# Patient Record
Sex: Female | Born: 1950 | Race: Asian | Hispanic: No | Marital: Married | Smoking: Current every day smoker
Health system: Southern US, Community
[De-identification: ages and names within clinical notes are randomized; demographics above are authoritative.]

## PROBLEM LIST (undated history)

## (undated) DIAGNOSIS — T7840XA Allergy, unspecified, initial encounter: Secondary | ICD-10-CM

## (undated) DIAGNOSIS — K219 Gastro-esophageal reflux disease without esophagitis: Secondary | ICD-10-CM

## (undated) DIAGNOSIS — E119 Type 2 diabetes mellitus without complications: Secondary | ICD-10-CM

## (undated) DIAGNOSIS — I10 Essential (primary) hypertension: Secondary | ICD-10-CM

## (undated) DIAGNOSIS — E785 Hyperlipidemia, unspecified: Secondary | ICD-10-CM

## (undated) DIAGNOSIS — M810 Age-related osteoporosis without current pathological fracture: Secondary | ICD-10-CM

## (undated) HISTORY — DX: Age-related osteoporosis without current pathological fracture: M81.0

## (undated) HISTORY — PX: COLONOSCOPY: SHX174

## (undated) HISTORY — DX: Gastro-esophageal reflux disease without esophagitis: K21.9

## (undated) HISTORY — DX: Allergy, unspecified, initial encounter: T78.40XA

## (undated) HISTORY — DX: Type 2 diabetes mellitus without complications: E11.9

## (undated) HISTORY — DX: Essential (primary) hypertension: I10

## (undated) HISTORY — DX: Hyperlipidemia, unspecified: E78.5

---

## 1976-07-31 HISTORY — PX: APPENDECTOMY: SHX54

## 1983-08-01 HISTORY — PX: TUBAL LIGATION: SHX77

## 2015-07-24 ENCOUNTER — Ambulatory Visit: Payer: Self-pay

## 2015-07-24 ENCOUNTER — Encounter (HOSPITAL_COMMUNITY): Payer: Self-pay | Admitting: Emergency Medicine

## 2015-07-24 ENCOUNTER — Emergency Department (INDEPENDENT_AMBULATORY_CARE_PROVIDER_SITE_OTHER)
Admission: EM | Admit: 2015-07-24 | Discharge: 2015-07-24 | Disposition: A | Discharge status: Home / Self Care | Payer: Medicaid - Out of State | Source: Home / Self Care

## 2015-07-24 DIAGNOSIS — K219 Gastro-esophageal reflux disease without esophagitis: Secondary | ICD-10-CM

## 2015-07-24 MED ORDER — OMEPRAZOLE 40 MG PO CPDR: 40.0000 mg | DELAYED_RELEASE_CAPSULE | Freq: Every day | ORAL | Status: DC

## 2015-07-24 NOTE — Discharge Instructions (Signed)
B?nh tro ng??c d? dy th?c qu?n, Ng??i l?n (Gastroesophageal Reflux Disease, Adult) Thng th??ng, th?c ?n di chuy?n xu?ng th?c qu?n v ? l?i d? dy ?? tiu ha. Tuy nhin, khi m?t ng??i b? b?nh tro ng??c d? dy th?c qu?n (GERD), th?c ?n v a xt trong d? dy tro ng??c tr? l?i th?c qu?n. Khi tnh tr?ng ny x?y ra, th?c qu?n b? lot v vim. D?n d?n, GERD c th? t?o ra nh?ng l? nh? (v?t lot) trn l?p nim m?c th?c qu?n.  NGUYN NHN Tnh tr?ng ny gy ra b?i m?t v?n ?? c?a ph?n c? gi?a th?c qu?n v d? dy (c? th?t th?c qu?n d??i, hay LES). Thng th??ng c? LES ?ng l?i sau khi th?c ?n ?i qua th?c qu?n vo d? dy. Khi LES b? y?u ho?c b?t th??ng, c? khng ?ng theo ?ng cch v ?i?u ? cho php th?c ?n v a xt d? dy tro ng??c tr? l?i th?c qu?n. LES c th? b? y?u do m?t s? ch?t ?n [place] nh?t ??nh, thu?c v cc tnh tr?ng b?nh l, bao g?m:  S? d?ng thu?c l.  Mang thai.  Thot v? honh.  S? d?ng nhi?u r??u.  M?t s? lo?i th?c ?n v ?? u?ng nh?t ??nh, nh? c ph, s c la, hnh v b?c h. CC Y?U T? NGUY C? Tnh tr?ng ny hay x?y ra h?n ?:  Nh?ng ng??i t?ng cn.  Nh?ng ng??i c cc b?nh ? m lin k?t.  Nh?ng ng??i s? d?ng thu?c NSAID. TRI?U CH?NG Nh?ng tri?u ch?ng c?a tnh tr?ng ny bao g?m:  ? nng.  Kh nu?t ho?c ?au khi nu?t.  C?m th?y nh? c m?t kh?i c?c trong c? h?ng.  C?m gic ??ng trong mi?ng.  H?i th? hi.  C nhi?u n??c b?t.  C?m gic kh ch?u trong b?ng ho?c ch??ng b?ng.  ? h?i.  ?au ng?c.  Kh th? ho?c th? kh kh.  Ho lin t?c (m?n tnh) ho?c ho vo ban ?m.  B? h?ng l?p men r?ng.  S?t cn. Nh?ng tnh tr?ng khc nhau c th? gy ?au ng?c. B?o ??m ph?i ??n khm chuyn gia ch?m [place] s?c kh?e n?u qu v? b? ?au ng?c. CH?N ?ON Chuyn gia ch?m [place] s?c kh?e c?a qu v? s? h?i v? b?nh s? v khm th?c th? cho qu v?. ?? xc ??nh qu v? b? GERD nh? hay n?ng, chuyn gia ch?m [place] s?c kh?e c?ng c th? theo di qu v? ?p ?ng v?i vi?c ?i?u tr? nh? th? no. Qu v? c?ng  c th? ph?i lm cc ki?m tra khc, bao g?m:  N?i soi ?? ki?m tra d? dy v th?c qu?n b?ng m?t camera nh?.  Ki?m tra ?o n?ng ?? a xt trong th?c qu?n c?a qu v?.  Ki?m tra ?o m?c p l?c ln th?c qu?n c?a qu v?.  Nu?t bari ho?c nu?t bari ?i?u ch?nh ?? hi?n th? hnh dng, kch th??c v ch?c n?ng c?a th?c qu?n c?a qu v?. ?I?U TR? M?c tiu c?a ?i?u tr? l gip gi?m cc tri?u ch?ng v trnh bi?n ch?ng. Vi?c ?i?u tr? b?nh ny c th? khc nhau ty thu?c m?c ?? n?ng c?a tri?u ch?ng. Chuyn gia ch?m [place] s?c kh?e c?a qu v? c th? khuy?n ngh?:  Thay ??i ch? ?? ?n.  Thu?c.  Ph?u thu?t. H??NG D?N CH?M [place] T?I NH Ch? ?? ?n  Tun th? m?t ch? ?? ?n theo khuy?n ngh? c?a chuyn gia ch?m [place] s?c kh?e. Vi?c ny c th? l  trnh cc th?c ?n v ?? u?ng nh?:  C ph v tr (c ho?c khng c caffeine).  ?? u?ng c ch?ar??u.  ?? u?ng t?ng l?c v ?? u?ng dng trong th? thao.  ?? u?ng c ga ho?c soda.  S c la v c ca.  B?c h v h??ng v? b?c h.  T?i v hnh.  C?i ng?a (Horseradish).  Cc th?c ?n nhi?u gia v? v a xt, bao g?m h?t tiu, b?t ?t, b?t ca ri, gi?m, n??c s?t cay, v n??c s?t barbecue.  N??c qu? ho?c qu? h? cam qut, ch?ng h?n nh? cam, chanh v chanh l cam.  Cc th?c ?n c c chua, nh? n??c x?t ??, ?t, n??c x?t salsa, v pizza km x?t ??  Th?c ?n chin v nhi?u ch?t bo, ch?ng h?n nh? bnh rn, khoai ty chin, khoai ty rn v n??c x?t nhi?u ch?t bo.  Th?t nhi?u ch?t bo, ch?ng h?n nh? hot dog (bnh m k?p xc xch) v cc lo?i th?t ?? v tr?ng nhi?u m?, ch?ng h?n nh? th?t n?c l?ng, xc xch, gi?m bng v th?t l?n xng khi.  Nh?ng s?n ph?m b? s?a giu ch?t bo, nh? s?a nguyn kem, b? v pho mt kem.  ?n cc b?a nh?, th??ng xuyn thay v cc b?a no.  Trnh u?ng nhi?u n??c khi qu v? ?n.  Trnh ?n trong kho?ng 2-3 gi? tr??c khi ?i ng?.  Trnh n?m xu?ng ngay sau khi ?n.  Khngt?p th? d?c ngay sau khi ?n. H??ng d?n chung  Ch  ??n nh?ng thay ??i v? tri?u ch?ng  c?a qu v?.  Ch? s? d?ng thu?c khng c?n k ??n v thu?c c?n k ??n theo ch? d?n c?a chuyn gia ch?m [place] s?c kh?e. Khng dng aspirin, ibuprofen, ho?c cc thu?c NSAID khc tr? khi chuyn gia ch?m [place] s?c kh?e c?a qu v? cho php.  Khng s? d?ng b?t k? s?n ph?m thu?c l no, bao g?m thu?c l d?ng ht, thu?c l d?ng nhai v thu?c l ?i?n t?. N?u qu v? c?n gip ?? ?? cai thu?c, hy h?i chuyn gia ch?m [place] s?c kh?e.  M?c qu?n o r?ng. Khng m?c ci g ch?t quanh eo m c th? t?o p l?c ln b?ng.  Nng (nng cao) ??u gi??ng c?a qu v? thm 6 inch (15 cm).  C? g?ng gi?m c?ng th?ng, ch?ng h?n nh? t?p yoga ho?c thi?n. N?u qu v? c?n gip ?? ?? gi?m c?ng th?ng, hy h?i chuyn gia ch?m [place] s?c kh?e.  N?u qu v? th?a cn, hy gi?m cn n?ng v? m?c c l?i cho s?c kh?e c?a qu v?. Hy h?i chuyn gia ch?m [place] s?c kh?e ?? ???c h??ng d?n v? m?c tiu gi?m cn an ton.  Tun th? t?t c? cc cu?c h?n khm l?i theo ch? d?n c?a chuyn gia ch?m [place] s?c kh?e. ?i?u ny c vai tr quan tr?ng. ?I KHM N?U:  Qu v? c cc tri?u ch?ng m?i.  Qu v? b? s?t cn khng r nguyn nhn.  Qu v? b? kh nu?t ho?c b? ?au khi nu?t.  Qu v? th? kh kh ho?c ho dai d?ng.  Cc tri?u ch?ng c?a qu v? khng c?i thi?n sau khi ???c ?i?u tr?Sander Nephew v? b? kh?n gi?ng. NGAY L?P T?C ?I KHM N?U:  Qu v? b? ?au ? cnh tay, c?, hm, r?ng ho?c l?ng.  Qu v? th?y ?? m? hi, chng m?t ho?c chong vng.  Qu v? b? ?au ng?c ho?c kh th?.  Qu v? nn v ch?t nn ra gi?ng nh? mu ho?c b c ph.  Qu v? b? ng?t.  Phn c?a qu v? c mu ho?c mu ?en.  Qu v? khng th? nu?t, u?ng hay ?n.   Thng tin ny khng nh?m m?c ?ch thay th? cho l?i khuyn m chuyn gia ch?m [place] s?c kh?e ni v?i qu v?. Hy b?o ??m qu v? ph?i th?o lu?n b?t k? v?n ?? g m qu v? c v?i chuyn gia ch?m  s?c kh?e c?a qu v?.   Document Released: 04/26/2005 Document Revised: 04/07/2015 Elsevier Interactive Patient Education [employer].

## 2015-07-24 NOTE — ED Notes (Signed)
C/o intermittent CP and abd pain onset 2 weeks Sx increase when she lays down and will experience SOB A&O x4... No acute distress.

## 2015-07-26 NOTE — ED Provider Notes (Signed)
CSN: [id number]     Arrival date & time 07/24/15  1335 History   None    Chief Complaint  Patient presents with  . Abdominal Pain  . Chest Pain   (Consider location/radiation/quality/duration/timing/severity/associated sxs/prior Treatment) HPI Presents with daily chest discomfrot for the last 2 weeks. States that discomfort feels similar to before she started omperazole. She states that she is eating different foods due to the holiday. Worse after eating, lasts for 10-15 minutes.   Was on 40 mg of omeprazole, but reduced to 20 mg because she did not feel she needed the 40. No vomiting, chest pain.  Recently moved to [place] from out of state. Awaiting transfer of insurance. History reviewed. No pertinent past medical history. History reviewed. No pertinent past surgical history. No family history on file. Social History  Substance Use Topics  . Smoking status: Never Smoker   . Smokeless tobacco: None  . Alcohol Use: No   OB History    No data available     Review of Systems ROS +'ve chest discomfort  Denies: HEADACHE, NAUSEA, ABDOMINAL PAIN, CHEST PAIN, CONGESTION, DYSURIA, SHORTNESS OF BREATH  Allergies  Review of patient's allergies indicates no known allergies.  Home Medications   Prior to Admission medications   Medication Sig Start Date End Date Taking? Authorizing Provider  diclofenac (VOLTAREN) 75 MG EC tablet Take 75 mg by mouth 2 (two) times daily.   Yes Historical Provider, MD  dicyclomine (BENTYL) 20 MG tablet Take 20 mg by mouth every 6 (six) hours.   Yes Historical Provider, MD  insulin glargine (LANTUS) 100 UNIT/ML injection Inject into the skin at bedtime.   Yes Historical Provider, MD  loratadine (CLARITIN) 10 MG tablet Take 10 mg by mouth daily.   Yes Historical Provider, MD  metFORMIN (GLUCOPHAGE) 1000 MG tablet Take 1,000 mg by mouth 2 (two) times daily with a meal.   Yes Historical Provider, MD  omeprazole (PRILOSEC) 20 MG capsule Take 20 mg by mouth daily.    Yes Historical Provider, MD  simvastatin (ZOCOR) 40 MG tablet Take 40 mg by mouth daily.   Yes Historical Provider, MD  traMADol (ULTRAM) 50 MG tablet Take by mouth every 6 (six) hours as needed.   Yes Historical Provider, MD  omeprazole (PRILOSEC) 40 MG capsule Take 1 capsule (40 mg total) by mouth daily. 07/24/15   Konrad Felix, PA   Meds Ordered and Administered this Visit  Medications - No data to display  BP 111/67 mmHg  Pulse 94  Temp(Src) 98 F (36.7 C) (Oral)  Resp 18  SpO2 98% No data found.   Physical Exam  Constitutional: She is oriented to person, place, and time. She appears well-developed and well-nourished.  HENT:  Head: Normocephalic and atraumatic.  Right Ear: External ear normal.  Left Ear: External ear normal.  Nose: Nose normal.  Mouth/Throat: Oropharynx is clear and moist.  Eyes: Conjunctivae are normal.  Neck: Normal range of motion. Neck supple.  Cardiovascular: Normal rate, regular rhythm and normal heart sounds.   Pulmonary/Chest: Effort normal and breath sounds normal.  Abdominal: Soft. Bowel sounds are normal. There is no tenderness. There is no rebound and no guarding.  Musculoskeletal: Normal range of motion.  Neurological: She is alert and oriented to person, place, and time.  Skin: Skin is warm and dry.  Psychiatric: She has a normal mood and affect. Her behavior is normal. Judgment and thought content normal.  Nursing note and vitals reviewed.   ED Course  Procedures (including critical care time)  Labs Review Labs Reviewed - No data to display  Imaging Review No results found.   Visual Acuity Review  Right Eye Distance:   Left Eye Distance:   Bilateral Distance:    Right Eye Near:   Left Eye Near:    Bilateral Near:         MDM   1. GERD without esophagitis    ECG: Normal sinus Rhythm without acute changes. Nl axis, intervals.   Increase Omperazole to 20 mg, bid.   Follow previous diet plan,  Help in finding  local pcp.    Konrad Felix, PA 07/26/15 1310

## 2016-01-04 ENCOUNTER — Ambulatory Visit (INDEPENDENT_AMBULATORY_CARE_PROVIDER_SITE_OTHER): Payer: Medicaid Other | Admitting: Internal Medicine

## 2016-01-04 ENCOUNTER — Encounter: Payer: Self-pay | Admitting: Internal Medicine

## 2016-01-04 VITALS — BP 120/48 | HR 67 | Temp 98.0°F | Ht 63.5 in | Wt 142.4 lb

## 2016-01-04 DIAGNOSIS — R059 Cough, unspecified: Secondary | ICD-10-CM | POA: Insufficient documentation

## 2016-01-04 DIAGNOSIS — M25512 Pain in left shoulder: Secondary | ICD-10-CM

## 2016-01-04 DIAGNOSIS — R05 Cough: Secondary | ICD-10-CM

## 2016-01-04 DIAGNOSIS — Z794 Long term (current) use of insulin: Secondary | ICD-10-CM | POA: Diagnosis not present

## 2016-01-04 DIAGNOSIS — E1165 Type 2 diabetes mellitus with hyperglycemia: Secondary | ICD-10-CM | POA: Insufficient documentation

## 2016-01-04 DIAGNOSIS — E785 Hyperlipidemia, unspecified: Secondary | ICD-10-CM | POA: Diagnosis not present

## 2016-01-04 DIAGNOSIS — E119 Type 2 diabetes mellitus without complications: Secondary | ICD-10-CM

## 2016-01-04 LAB — LIPID PANEL
Cholesterol: 146 mg/dL (ref 125–200)
HDL: 36 mg/dL — AB (ref >=46)
LDL CALC: 37 mg/dL (ref <130)
TRIGLYCERIDES: 363 mg/dL — AB (ref <150)
Total CHOL/HDL Ratio: 4.1 Ratio (ref <=5.0)
VLDL: 73 mg/dL — AB (ref <30)

## 2016-01-04 LAB — POCT GLYCOSYLATED HEMOGLOBIN (HGB A1C): Hemoglobin A1C: 9.2

## 2016-01-04 MED ORDER — BENZONATATE 100 MG PO CAPS: 100.0000 mg | ORAL_CAPSULE | Freq: Three times a day (TID) | ORAL | Status: DC | PRN

## 2016-01-04 MED ORDER — MELOXICAM 7.5 MG PO TABS: 7.5000 mg | ORAL_TABLET | Freq: Every day | ORAL | Status: DC

## 2016-01-04 NOTE — Patient Instructions (Addendum)
It was nice meeting you today Ms. Alyssa Weaver.   For your cough, you can take 2-3 teaspoons of honey, either alone or in a warm beverage, 2-3 times a day. I have also prescribed a medication called Tessalon perles to help with your cough. Please take this as prescribed on the bottle.   For your shoulder, I have prescribed a medication called meloxicam. Take this once a day. Also, please do the stretches I have listed two times a day.   We will see you back on June 20 to go over the results of your blood tests, and make sure your shoulder and throat are feeling better.   Be well,  Dr. Avon Gully  Shoulder Range of Motion Exercises Shoulder range of motion (ROM) exercises are designed to keep the shoulder moving freely. They are often recommended for people who have shoulder pain. MOVEMENT EXERCISE When you are able, do this exercise 5-6 days per week, or as told by your health care provider. Work toward doing 2 sets of 10 swings. Pendulum Exercise How To Do This Exercise Lying Down 1. Lie face-down on a bed with your abdomen close to the side of the bed. 2. Let your arm hang over the side of the bed. 3. Relax your shoulder, arm, and hand. 4. Slowly and gently swing your arm forward and back. Do not use your neck muscles to swing your arm. They should be relaxed. If you are struggling to swing your arm, have someone gently swing it for you. When you do this exercise for the first time, swing your arm at a 15 degree angle for 15 seconds, or swing your arm 10 times. As pain lessens over time, increase the angle of the swing to 30-45 degrees. 5. Repeat steps 1-4 with the other arm. How To Do This Exercise While Standing 1. Stand next to a sturdy chair or table and hold on to it with your hand.  Bend forward at the waist.  Bend your knees slightly.  Relax your other arm and let it hang limp.  Relax the shoulder blade of the arm that is hanging and let it drop.  While keeping your shoulder  relaxed, use body motion to swing your arm in small circles. The first time you do this exercise, swing your arm for about 30 seconds or 10 times. When you do it next time, swing your arm for a little longer.  Stand up tall and relax.  Repeat steps 1-7, this time changing the direction of the circles. 2. Repeat steps 1-8 with the other arm. STRETCHING EXERCISES Do these exercises 3-4 times per day on 5-6 days per week or as told by your health care provider. Work toward holding the stretch for 20 seconds. Stretching Exercise 1 1. Lift your arm straight out in front of you. 2. Bend your arm 90 degrees at the elbow (right angle) so your forearm goes across your body and looks like the letter "L." 3. Use your other arm to gently pull the elbow forward and across your body. 4. Repeat steps 1-3 with the other arm. Stretching Exercise 2 You will need a towel or rope for this exercise. 1. Bend one arm behind your back with the palm facing outward. 2. Hold a towel with your other hand. 3. Reach the arm that holds the towel above your head, and bend that arm at the elbow. Your wrist should be behind your neck. 4. Use your free hand to grab the free end of the  towel. 5. With the higher hand, gently pull the towel up behind you. 6. With the lower hand, pull the towel down behind you. 7. Repeat steps 1-6 with the other arm. STRENGTHENING EXERCISES Do each of these exercises at four different times of day (sessions) every day or as told by your health care provider. To begin with, repeat each exercise 5 times (repetitions). Work toward doing 3 sets of 12 repetitions or as told by your health care provider. Strengthening Exercise 1 You will need a light weight for this activity. As you grow stronger, you may use a heavier weight. 1. Standing with a weight in your hand, lift your arm straight out to the side until it is at the same height as your shoulder. 2. Bend your arm at 90 degrees so that your  fingers are pointing to the ceiling. 3. Slowly raise your hand until your arm is straight up in the air. 4. Repeat steps 1-3 with the other arm. Strengthening Exercise 2 You will need a light weight for this activity. As you grow stronger, you may use a heavier weight. 1. Standing with a weight in your hand, gradually move your straight arm in an arc, starting at your side, then out in front of you, then straight up over your head. 2. Gradually move your other arm in an arc, starting at your side, then out in front of you, then straight up over your head. 3. Repeat steps 1-2 with the other arm. Strengthening Exercise 3 You will need an elastic band for this activity. As you grow stronger, gradually increase the size of the bands or increase the number of bands that you use at one time. 1. While standing, hold an elastic band in one hand and raise that arm up in the air. 2. With your other hand, pull down the band until that hand is by your side. 3. Repeat steps 1-2 with the other arm.   This information is not intended to replace advice given to you by your health care provider. Make sure you discuss any questions you have with your health care provider.   Document Released: 04/15/2003 Document Revised: 12/01/2014 Document Reviewed: 07/13/2014 Elsevier Interactive Patient Education [employer].

## 2016-01-04 NOTE — Assessment & Plan Note (Signed)
Taking simvastatin as prescribed by prior PCP in [place].  - Cholesterol panel today - Review labs and make necessary med changes at f/u in two weeks

## 2016-01-04 NOTE — Assessment & Plan Note (Signed)
Likely 2/2 muscle strain, though patient does not recall trauma or inciting event. No inflammation or erythema visible on exam. ROM slightly limited due to pain. Neer's, empty can, arm drop negative. Full strength.  - Meloxicam qd - Provided stretching and strengthening exercises to be performed BID - F/u at appt in 2 weeks

## 2016-01-04 NOTE — Progress Notes (Signed)
Patient ID: Alyssa Weaver, female   DOB: [birth year], 65 y.o.   MRN: [id number]  65 y.o. year old female presents for well-woman visit. She recently moved from [place] and is here to establish care.   Acute Concerns:  Sore throat and cough - Started two days ago. Robitussin helped some. No fevers. Cough productive of white sputum without blood. Patient is current every day smoker and has smoked for 25 years.   L shoulder pain - Begin four weeks ago. Does not recall trauma or anything that happened prior to pain. Has been taking Advil and Tramadol that was prescribed to her in Novebmer 2016 by prior PCP in [place]. Denies swelling or redness. Pain is increased when laying on the affected shoulder. Has a shooting pain in the affected shoulder when picking up something heavy.   Diet: Well-balanced, doesn't eat greasy foods or sweets. Eats vegetables daily. Drinks water, orange juice, doesn't drink soda. Doesn't eat at fast food restaurants.   Exercise: Walks an hour a day.   Sexual/Birth History: G5P5  Birth Control: post-menopausal   Social:  Social History   Social History  . Marital Status: Married    Spouse Name: N/A  . Number of Children: N/A  . Years of Education: N/A   Social History Main Topics  . Smoking status: Current Every Day Smoker -- 0.25 packs/day for 25 years    Types: Cigarettes  . Smokeless tobacco: Never Used  . Alcohol Use: No  . Drug Use: No  . Sexual Activity: Yes    Birth Control/ Protection: Post-menopausal   Other Topics Concern  . None   Social History Narrative   Smokes cigarettes - one pack every 3-4 days. Has smoked for 25 years. At most 6-7 cigarettes a day. No EtOH use. No illicit drug use.    Immunization:  There is no immunization history on file for this patient.  Cancer Screening: Per patient. Results to be sent from PCP in [place].   Pap Smear: April 2016. No abnormalities per patient.   Mammogram: 2016. No abnormalities per patient.    Colonoscopy: 2008. No abnormalities per patient.   Dexa: Never performed.   Physical Exam: VITALS: Reviewed GEN: Pleasant female, NAD HEENT: Normocephalic, PERRL, EOMI, no scleral icterus, bilateral TM pearly grey, nasal septum midline, MMM, uvula midline, no anterior or posterior lymphadenopathy, no thyromegaly. No oropharyngeal erythema or exudates, no tonsillar enlargement.  CARDIAC:RRR, S1 and S2 present, no murmur, no heaves/thrills RESP: CTAB, normal effort ABD: soft, no tenderness, normal bowel sounds EXT: No edema SKIN: No rashes or bruises noted MSK: Mild TTP of L shoulder. No bony abnormalities, erythema, or swelling. Neer's, empty can, and arm drop negative. 5/5 strength upper extremities bilaterally. Slightly limited ROM 2/2 pain.   ASSESSMENT & PLAN: 65 y.o. female presents to establish care. Please see problem specific assessment and plan.   Type 2 diabetes mellitus ([place]) Currently taking Lantus and metformin 1000mg  as prescribed by prior PCP in [place].  - A1C today - Review labs and make necessary med changes at f/u appt in two weeks  Hyperlipidemia Taking simvastatin as prescribed by prior PCP in [place].  - Cholesterol panel today - Review labs and make necessary med changes at f/u in two weeks  Cough and sore throat Likely of viral etiology and/or related to tobacco abuse. No oropharyngeal erythema or exudates, no tonsillar enlargement. Lungs CTAB. Sore throat likely 2/2 coughing. Centor score -1 (1-2% strep pharyngitis) so strep testing not indicated today.  -  Honey 2-3 teaspoons BID PRN - Tessalon perles - F/u at appt in 2 weeks  Left shoulder pain Likely 2/2 muscle strain, though patient does not recall trauma or inciting event. No inflammation or erythema visible on exam. ROM slightly limited due to pain. Neer's, empty can, arm drop negative. Full strength.  - Meloxicam qd - Provided stretching and strengthening exercises to be performed BID - F/u at appt in 2  weeks    Adin Hector, MD PGY-1 [facility] Pager [phone]

## 2016-01-04 NOTE — Assessment & Plan Note (Signed)
Currently taking Lantus and metformin 1000mg  as prescribed by prior PCP in [place].  - A1C today - Review labs and make necessary med changes at f/u appt in two weeks

## 2016-01-04 NOTE — Assessment & Plan Note (Addendum)
Likely of viral etiology and/or related to tobacco abuse. No oropharyngeal erythema or exudates, no tonsillar enlargement. Lungs CTAB. Sore throat likely 2/2 coughing. Centor score -1 (1-2% strep pharyngitis) so strep testing not indicated today.  - Honey 2-3 teaspoons BID PRN - Tessalon perles - F/u at appt in 2 weeks

## 2016-01-18 ENCOUNTER — Ambulatory Visit: Payer: Medicaid - Out of State | Admitting: Internal Medicine

## 2016-01-18 ENCOUNTER — Ambulatory Visit (INDEPENDENT_AMBULATORY_CARE_PROVIDER_SITE_OTHER): Payer: Medicaid Other | Admitting: Internal Medicine

## 2016-01-18 ENCOUNTER — Encounter: Payer: Self-pay | Admitting: Internal Medicine

## 2016-01-18 VITALS — BP 126/55 | HR 72 | Temp 97.8°F | Ht 63.5 in | Wt 145.0 lb

## 2016-01-18 DIAGNOSIS — E785 Hyperlipidemia, unspecified: Secondary | ICD-10-CM | POA: Diagnosis not present

## 2016-01-18 DIAGNOSIS — Z794 Long term (current) use of insulin: Secondary | ICD-10-CM

## 2016-01-18 DIAGNOSIS — E119 Type 2 diabetes mellitus without complications: Secondary | ICD-10-CM | POA: Diagnosis not present

## 2016-01-18 MED ORDER — INSULIN GLARGINE 100 UNIT/ML ~~LOC~~ SOLN: 30.0000 [IU] | Freq: Every day | SUBCUTANEOUS | Status: DC

## 2016-01-18 MED ORDER — ATORVASTATIN CALCIUM 80 MG PO TABS: 80.0000 mg | ORAL_TABLET | Freq: Every day | ORAL | Status: DC

## 2016-01-18 NOTE — Assessment & Plan Note (Signed)
Poorly controlled with last A1C 9.2 (12/2015).  - Continue metformin 1000mg  BID - Increase Lantus to 30U qd (patient taking at lunchtime) - Begin measuring 2hr post-prandial blood sugar rather than fasting only - Encouraged patient to write down blood sugars daily and bring to next appt - F/u in one month. Adjust insulin as needed at that time.

## 2016-01-18 NOTE — Progress Notes (Signed)
   Subjective:    Patient ID: Alyssa Weaver, female    DOB: 09-02-1950, 65 y.o.   MRN: [id number]  HPI  Patient presents for diabetes and hyperlipidemia follow-up.   Diabetes Patient with A1C of 9.2 at last appt. Reports taking metformin 1000mg  BID and Lantus 25U at lunchtime. Says that she never misses a dose. Checks blood sugar first thing in the morning, and usually gets measurements between 50-100. Highest recent blood sugar she can remember was 130. She does not check her blood sugar at any other time of day.   Hyperlipidemia Patient currently taking simvastatin 40mg  qd. Denies missing any doses. Triglycerides and VLDL elevated at previous appt, with low HDL.   Of note, patient has recently moved from [place]. She reports that she does not have [place] Medicaid yet, and will be going back to her previous PCP in [place] within the next month to have her medications refilled.   Patient is current every day smoker.   Review of Systems See HPI.     Objective:   Physical Exam  Constitutional: She is oriented to person, place, and time. She appears well-developed and well-nourished. No distress.  HENT:  Head: Normocephalic and atraumatic.  Pulmonary/Chest: Effort normal. No respiratory distress.  Neurological: She is alert and oriented to person, place, and time.  Psychiatric: She has a normal mood and affect. Her behavior is normal.  Vitals reviewed.     Assessment & Plan:  Type 2 diabetes mellitus ([place]) Poorly controlled with last A1C 9.2 (12/2015).  - Continue metformin 1000mg  BID - Increase Lantus to 30U qd (patient taking at lunchtime) - Begin measuring 2hr post-prandial blood sugar rather than fasting only - Encouraged patient to write down blood sugars daily and bring to next appt - F/u in one month. Adjust insulin as needed at that time.   Hyperlipidemia Poorly controlled. High triglycerides and low HDL at prior appointment. Per ASCVD risk calculator, patient should be on high  intensity statin.  - Discontinue simvastatin and begin atorvastatin 80mg  qd   Adin Hector, MD PGY-1 [facility] Pager [phone]

## 2016-01-18 NOTE — Patient Instructions (Addendum)
It was nice seeing you again today.   To improve your cholesterol, we are changing your cholesterol medication. Please STOP taking simvastatin (Zocor), and START taking atorvastatin (Lipitor) 80mg  once a day.   We are increasing your dose of insulin (Lantus) so that your diabetes will be better controlled. Please increase your dose of Lantus to 30 units once a day. Also, please start measuring your blood sugar 2 hours after you eat lunch. It will be helpful to write down these blood sugar measurements and bring them with you to your next visit.   I will see you back in one month to see if your diabetes is under better control.   If you have any questions, please feel free to call the office.   Be well,  Dr. Avon Gully

## 2016-01-18 NOTE — Assessment & Plan Note (Signed)
Poorly controlled. High triglycerides and low HDL at prior appointment. Per ASCVD risk calculator, patient should be on high intensity statin.  - Discontinue simvastatin and begin atorvastatin 80mg  qd

## 2016-02-18 ENCOUNTER — Ambulatory Visit: Payer: Medicaid - Out of State | Admitting: Internal Medicine

## 2016-04-17 ENCOUNTER — Encounter: Payer: Self-pay | Admitting: Internal Medicine

## 2016-04-17 ENCOUNTER — Ambulatory Visit (INDEPENDENT_AMBULATORY_CARE_PROVIDER_SITE_OTHER): Payer: Self-pay | Admitting: Internal Medicine

## 2016-04-17 VITALS — BP 111/39 | HR 72 | Temp 98.2°F | Ht 63.5 in | Wt 142.8 lb

## 2016-04-17 DIAGNOSIS — E785 Hyperlipidemia, unspecified: Secondary | ICD-10-CM

## 2016-04-17 DIAGNOSIS — Z794 Long term (current) use of insulin: Secondary | ICD-10-CM

## 2016-04-17 DIAGNOSIS — E119 Type 2 diabetes mellitus without complications: Secondary | ICD-10-CM

## 2016-04-17 DIAGNOSIS — M25512 Pain in left shoulder: Secondary | ICD-10-CM

## 2016-04-17 LAB — POCT GLYCOSYLATED HEMOGLOBIN (HGB A1C): HEMOGLOBIN A1C: 8.8

## 2016-04-17 MED ORDER — INSULIN GLARGINE 100 UNIT/ML ~~LOC~~ SOLN: 33.0000 [IU] | Freq: Every day | SUBCUTANEOUS | 3 refills | Status: DC

## 2016-04-17 MED ORDER — ATORVASTATIN CALCIUM 80 MG PO TABS: 80.0000 mg | ORAL_TABLET | Freq: Every day | ORAL | 3 refills | Status: DC

## 2016-04-17 MED ORDER — METFORMIN HCL 1000 MG PO TABS: 1000.0000 mg | ORAL_TABLET | Freq: Two times a day (BID) | ORAL | 3 refills | Status: DC

## 2016-04-17 MED ORDER — MELOXICAM 15 MG PO TABS: 15.0000 mg | ORAL_TABLET | Freq: Every day | ORAL | 3 refills | Status: DC

## 2016-04-17 NOTE — Progress Notes (Signed)
   Subjective:    Patient ID: Alyssa Weaver, female    DOB: 1951-06-04, 65 y.o.   MRN: [id number]  HPI  Patient presents for DM follow up and shoulder pain.   Type II DM Currently taking metformin 1000mg  BID and Lantus 30U. Reports persistently high blood sugar at night, up to 350. Reports feeling tired. Denies numbness or tingling in extremities. Endorses having to read with glasses now, but nov ision change otherwise.   L shoulder pain Present for the past two months. Previously prescribed meloxicam 7.5mg . Reports pain is improved with this, but pain relief does not last all day. Has not taken anything else for pain.   HLD Taking atorvastatin 80 mg.   Review of Systems See HPI.     Objective:   Physical Exam  Constitutional: She is oriented to person, place, and time. She appears well-developed and well-nourished. No distress.  HENT:  Head: Normocephalic and atraumatic.  Cardiovascular: Normal rate, regular rhythm and normal heart sounds.   No murmur heard. Pulmonary/Chest: Effort normal and breath sounds normal. No respiratory distress. She has no wheezes.  Musculoskeletal:  No TTP of L shoulder. 5/5 strength upper extremities bilaterally. Negative empty can.   Neurological: She is alert and oriented to person, place, and time.  Psychiatric: She has a normal mood and affect. Her behavior is normal.  Vitals reviewed.     Assessment & Plan:  Type 2 diabetes mellitus (HCC) Uncontrolled. A1C remains elevated at 8.8 today, but has improved from 9.8 at last visit.  - Continue metformin 1000mg  BID - Increase Lantus to 33U - F/u in three months for repeat A1C  Left shoulder pain Increased Mobic to 15mg   Hyperlipidemia - Continue atorvastatin 80mg  - Repeat lipid panel today. Adjust medication at next visit as needed.   Adin Hector, MD, MPH PGY-2 [facility] Pager [phone]

## 2016-04-17 NOTE — Patient Instructions (Addendum)
It was nice seeing you again today Ms. Kopelman.   Please increase your insulin dose to 33 units per day. I will see you back in three months to make sure your A1C is improving.   For your shoulder pain, I have increased your dose of Mobic to 15 mg. You can continue to take this daily.   If you have any questions or concerns, please feel free to call the clinic.   Be well,  Dr. Avon Gully

## 2016-04-17 NOTE — Assessment & Plan Note (Signed)
-   Continue atorvastatin 80mg  - Repeat lipid panel today. Adjust medication at next visit as needed.

## 2016-04-17 NOTE — Assessment & Plan Note (Signed)
Increased Mobic to 15mg 

## 2016-04-17 NOTE — Assessment & Plan Note (Addendum)
Uncontrolled. A1C remains elevated at 8.8 today, but has improved from 9.8 at last visit.  - Continue metformin 1000mg  BID - Increase Lantus to 33U - F/u in three months for repeat A1C

## 2016-04-18 LAB — LIPID PANEL
Cholesterol: 111 mg/dL — ABNORMAL LOW (ref 125–200)
HDL: 39 mg/dL — AB (ref >=46)
LDL CALC: 54 mg/dL (ref <130)
Total CHOL/HDL Ratio: 2.8 Ratio (ref <=5.0)
Triglycerides: 88 mg/dL (ref <150)
VLDL: 18 mg/dL (ref <30)

## 2016-06-06 ENCOUNTER — Ambulatory Visit (INDEPENDENT_AMBULATORY_CARE_PROVIDER_SITE_OTHER): Payer: Self-pay | Admitting: Internal Medicine

## 2016-06-06 ENCOUNTER — Encounter: Payer: Self-pay | Admitting: Internal Medicine

## 2016-06-06 VITALS — BP 114/80 | HR 76 | Resp 18 | Ht 62.5 in | Wt 142.0 lb

## 2016-06-06 DIAGNOSIS — Z794 Long term (current) use of insulin: Secondary | ICD-10-CM

## 2016-06-06 DIAGNOSIS — G8929 Other chronic pain: Secondary | ICD-10-CM

## 2016-06-06 DIAGNOSIS — E782 Mixed hyperlipidemia: Secondary | ICD-10-CM

## 2016-06-06 DIAGNOSIS — E119 Type 2 diabetes mellitus without complications: Secondary | ICD-10-CM

## 2016-06-06 DIAGNOSIS — M25512 Pain in left shoulder: Secondary | ICD-10-CM

## 2016-06-06 MED ORDER — INSULIN GLARGINE 100 UNIT/ML ~~LOC~~ SOLN: 33.0000 [IU] | Freq: Every day | SUBCUTANEOUS | 11 refills | Status: DC

## 2016-06-06 MED ORDER — GLUCOSE BLOOD VI STRP: ORAL_STRIP | 12 refills | Status: DC

## 2016-06-06 MED ORDER — OMEPRAZOLE 40 MG PO CPDR: 40.0000 mg | DELAYED_RELEASE_CAPSULE | Freq: Every day | ORAL | 11 refills | Status: DC

## 2016-06-06 MED ORDER — AGAMATRIX PRESTO W/DEVICE KIT: PACK | 0 refills | Status: DC

## 2016-06-06 MED ORDER — METFORMIN HCL 1000 MG PO TABS: 1000.0000 mg | ORAL_TABLET | Freq: Two times a day (BID) | ORAL | 11 refills | Status: DC

## 2016-06-06 MED ORDER — ATORVASTATIN CALCIUM 80 MG PO TABS: 80.0000 mg | ORAL_TABLET | Freq: Every day | ORAL | 11 refills | Status: DC

## 2016-06-06 NOTE — Patient Instructions (Addendum)
Drink a glass of water before every meal Drink 6-8 glasses of water daily Eat three meals daily Eat a protein and healthy fat with every meal (eggs,fish, chicken, [place] and limit red meats) Eat 5 servings of vegetables daily, mix the colors Eat 2 servings of fruit daily with skin, if skin is edible Use smaller plates Put food/utensils down as you chew and swallow each bite Eat at a table with friends/family at least once daily, no TV Do not eat in front of the TV  Check sugars twice daily , before breakfast and before dinner. Once weekly, check before lunch and once before bedtime instead of dinner

## 2016-06-06 NOTE — Progress Notes (Signed)
   Subjective:    Patient ID: Alyssa Weaver, female    DOB: 1950/12/16, 65 y.o.   MRN: [id number]  HPI   Here to establish Moved from the [place] after living there for 30 years in November 2016. Originally from [place] Here with husband  1.  DM Type 2:  Diagnosed for 20 years.  A1C in September was 8.8S%. This was before a change in her Lantus from 30 units to 33 units.  She is only checking sugars with breakfast.  Sugars then are almost uniformaly below 110.  Often down into 50s or 60s.  She holds her Metformin in the morning if her sugar is below 100.  States generally twice weekly she is holding Metformin. Takes Lantus at lunch Eats white bread and coffee for morning meal.   Eats white rice.   Drinks juice no soda.  No heart disease Last eye check was August 2016 No flu vaccine this year Husband thinks she has had her pneumonia vaccine.   2.  Hyperlipidemia:  Taking Atorvastatin 80 mg daily  Current Meds  Medication Sig  . atorvastatin (LIPITOR) 80 MG tablet Take 1 tablet (80 mg total) by mouth daily.  . insulin glargine (LANTUS) 100 UNIT/ML injection Inject 0.33 mLs (33 Units total) into the skin at bedtime.  Marland Kitchen loratadine (CLARITIN) 10 MG tablet Take 10 mg by mouth daily.  . meloxicam (MOBIC) 15 MG tablet Take 1 tablet (15 mg total) by mouth daily.  . metFORMIN (GLUCOPHAGE) 1000 MG tablet Take 1 tablet (1,000 mg total) by mouth 2 (two) times daily with a meal.  . omeprazole (PRILOSEC) 40 MG capsule Take 1 capsule (40 mg total) by mouth daily.  . traMADol (ULTRAM) 50 MG tablet Take 50 mg by mouth every 6 (six) hours as needed.   No Known Allergies    Review of Systems     Objective:   Physical Exam  NAD Lungs: CTA CV:  RRR with normal S1 and S2, No S3, S4 or murmur, radial and DP pulses normal and equal. LE:  No edema      Assessment & Plan:  1.  DM:  Her diet need improvement.  Long 4 way discussion with daughter, Alyssa Weaver helping with interpretation regarding  diet and not missing meals.  Also to check sugars twice daily so we can get a handle as to what her sugars are doing later in day as often low in the morning and A1C still too high. Encouraged flu vaccine and will get old records regarding Pneumovax  2.  HYperlipidemia:  Continue Current dose of Atorvastatin for now, but will likely decrease dose in the future.  3.  Left shoulder pain, chronic:  Will address at next visit.

## 2016-06-08 ENCOUNTER — Telehealth: Payer: Self-pay | Admitting: Internal Medicine

## 2016-06-08 NOTE — Telephone Encounter (Signed)
Called and left message ok for solostar and also that ok for 30 units, but thought she was taking 33 and had not changed the dosing yet.

## 2016-06-08 NOTE — Telephone Encounter (Signed)
[facility] pharmacy called and would like to speak to Dr. Amil Amen today about the patient's Lantus 33 units  Patient was there yesterday and the pharmacy used the interpreter line with the patient and learned patient had used Lantus Solostar and reports using 30 units. Please advise

## 2016-06-19 ENCOUNTER — Ambulatory Visit: Payer: Self-pay | Admitting: Internal Medicine

## 2016-06-19 ENCOUNTER — Telehealth: Payer: Self-pay | Admitting: Internal Medicine

## 2016-06-19 MED ORDER — DEXLANSOPRAZOLE 30 MG PO CPDR: 30.0000 mg | DELAYED_RELEASE_CAPSULE | Freq: Every day | ORAL | 11 refills | Status: DC

## 2016-06-19 MED ORDER — MOMETASONE FUROATE 50 MCG/ACT NA SUSP: NASAL | 12 refills | Status: DC

## 2016-06-19 MED ORDER — INSULIN GLARGINE 100 UNIT/ML SOLOSTAR PEN: PEN_INJECTOR | SUBCUTANEOUS | 99 refills | Status: DC

## 2016-08-01 ENCOUNTER — Ambulatory Visit (INDEPENDENT_AMBULATORY_CARE_PROVIDER_SITE_OTHER): Payer: Self-pay | Admitting: Internal Medicine

## 2016-08-01 ENCOUNTER — Encounter: Payer: Self-pay | Admitting: Internal Medicine

## 2016-08-01 VITALS — BP 110/78 | HR 74 | Resp 12 | Ht 62.0 in | Wt 142.0 lb

## 2016-08-01 DIAGNOSIS — Z794 Long term (current) use of insulin: Secondary | ICD-10-CM

## 2016-08-01 DIAGNOSIS — Z9109 Other allergy status, other than to drugs and biological substances: Secondary | ICD-10-CM

## 2016-08-01 DIAGNOSIS — M25512 Pain in left shoulder: Secondary | ICD-10-CM

## 2016-08-01 DIAGNOSIS — J3089 Other allergic rhinitis: Secondary | ICD-10-CM | POA: Insufficient documentation

## 2016-08-01 DIAGNOSIS — K029 Dental caries, unspecified: Secondary | ICD-10-CM

## 2016-08-01 DIAGNOSIS — Z79899 Other long term (current) drug therapy: Secondary | ICD-10-CM

## 2016-08-01 DIAGNOSIS — E119 Type 2 diabetes mellitus without complications: Secondary | ICD-10-CM

## 2016-08-01 DIAGNOSIS — E782 Mixed hyperlipidemia: Secondary | ICD-10-CM

## 2016-08-01 DIAGNOSIS — G8929 Other chronic pain: Secondary | ICD-10-CM

## 2016-08-01 LAB — GLUCOSE, POCT (MANUAL RESULT ENTRY): POC Glucose: 183 mg/dl — AB (ref 70–99)

## 2016-08-01 NOTE — Patient Instructions (Addendum)
For Nicotine gum:  When you feel need for smoking:  Place nicotine gum in mouth and chew until juicy, then stick between gum and cheek.  You will absorb the nicotine from your mouth.   Do not aggressively chew gum. Spit gum out in garbage once you feel you are done with it  For Sone (Husband)  Recommend nicotine patches:  21 mg to skin and change daily for 28 days, then down to 14 mg patches for 14 days, then down to 7 mg patches for 14 days, then stop. Get rid of all cigarettes, lighters, ash trays, smoking paraphernalia before start first patch  Sign release for records from eye doctor visit in September so we can see if you have a prescription

## 2016-08-01 NOTE — Progress Notes (Signed)
Subjective:    Patient ID: Alyssa Weaver, female    DOB: July 19, 1951, 66 y.o.   MRN: [id number]  HPI   1.  DM:  Did not bring glucose journal.  Checking sugars twice daily before breakfast and before dinner.  Had one low blood sugar that awakened her from sleep on 12/29:  In the 28s.  Ate sticky rice and juice as well as candy.  This happens maybe once to twice monthly.  Sounds like when she is more active or eats less in the evening before.   Sugars running 80s to 130s in the morning. Sugars before dinner running around 130s.   Very difficult interpretation with husband and patient today.   Is fasting today. They did sign release for records from Dr. Reinaldo Berber in [facility] last visit.  These have not come to [place] yet. Husband believes she had a Pneumovax. Lantus 30 units before lunch. Needs needles for Lantus injections.  2.  Hyperlipidemia:  Previous lipid profile with low HDL, but LDL quite low as well as was total  3.  Tobacco Abuse:  Smokes 5 cigarettes daily, usually after eating.  Would like to quit, but does not know how.  Discussed nicotine gum.  4.  Left shoulder pain:  For 6 months.  No history of injury to the joint.  Not clear how started.  Sounds like a gradually increasing pain.  No activity that seemed to bring it on originally.  She has difficulty moving the shoulder in almost any direction and also with sleeping on the shoulder.  Was previously taking Meloxicam and Tramadol in the past.  Helped, but ran out.   Has not received any physical therapy for this.  Not clear what patient taking.  Husband and wife state she is taking medication they received, but then showing me old prescriptions for test strips, Fluticasone and state they have not picked up the Rx from [facility].  Current Meds  Medication Sig  . atorvastatin (LIPITOR) 80 MG tablet Take 1 tablet (80 mg total) by mouth daily.  . Blood Glucose Monitoring Suppl (AGAMATRIX PRESTO) w/Device KIT Check sugars  twice daily before meals  . glucose blood (AGAMATRIX PRESTO TEST) test strip Check sugars twice daily  . Insulin Glargine (LANTUS SOLOSTAR) 100 UNIT/ML Solostar Pen 30 units injected subcutaneously once daily  . loratadine (CLARITIN) 10 MG tablet Take 10 mg by mouth daily.  . metFORMIN (GLUCOPHAGE) 1000 MG tablet Take 1 tablet (1,000 mg total) by mouth 2 (two) times daily with a meal.  . mometasone (NASONEX) 50 MCG/ACT nasal spray 2 sprays each nostril daily    No Known Allergies   Past Medical History:  Diagnosis Date  . Diabetes mellitus without complication ([place])   . Hyperlipidemia     History reviewed. No pertinent surgical history.   Social History   Social History  . Marital status: Married    Spouse name: [name]  . Number of children: 5  . Years of education: 50   Occupational History  . housewife    Social History Main Topics  . Smoking status: Current Every Day Smoker    Packs/day: 0.25    Years: 25.00    Types: Cigarettes  . Smokeless tobacco: Never Used     Comment: Husband smokes 1 ppd.  He might be willing to quit at same time.  . Alcohol use No  . Drug use: No  . Sexual activity: Yes    Birth control/ protection: Post-menopausal  Other Topics Concern  . Not on file   Social History Narrative   Originally from [place]   Refugees--lived in [place] 2 years.   Came to the [place].   Lived in the [place] until moving to [facility] Nov. 2016   Family History  Problem Relation Age of Onset  . Asthma Brother        Review of Systems     Objective:   Physical Exam NAD HEENT:  PERRL, EOMI:  Discs sharp, TMs pearly gray, throat without injection.  Significant gingival recession, tooth loss and dental caries with periodontal plaque. Neck:  Supple no adenopathy Chest:  CTA CV:  RRR with normal S1 and S2, No S3, S4 or murmur, No carotid bruits, carotid, radial and DP pulses  Normal and equal No LE edema  Diabetic Foot Exam - Simple   Simple Foot  Form Diabetic Foot exam was performed with the following findings:  Yes 08/01/2016 10:19 AM  Visual Inspection No deformities, no ulcerations, no other skin breakdown bilaterally:  Yes Sensation Testing Intact to touch and monofilament testing bilaterally:  Yes Pulse Check Posterior Tibialis and Dorsalis pulse intact bilaterally:  Yes Comments Some dryness of edges of heels bilaterally          Assessment & Plan:  1.  DM:  Not clear if getting a good history.  She did not bring in diabetic journal.  She did not bring in meds or supplies.  Not clear if any changes made with diet/physical activity A1C and microalbumin/crea ratio Call into [facility] to see what they have applied for and picked up there.  2.  Hyperlipidemia:  Recheck today along with CMP.  Consider drop of statin to lower dose if total and LDL still quite low.  Will add fish oil if HDL remains low.  3.  Chronic left shoulder pain:  Appears frozen.  Xray ordered through orange card and PT in [facility].  Went over this with couple.  They should receive phone calls.  4.  Dental decay:  Dental referral  5.  Decreased visual acuity:  Reportedly had eye check in the [place] in September.  They are not aware of a prescription for lenses being written, but state she needs new glasses.  Will send for those records.  Can get a voucher for eyeglasses thereafter through orange card.  6. Environmental allergies:  Discussed Nasonex, does not appear they applied and picked up.  Using Fluticasone as needed --discussed to use regularly during the winter months when she has symptoms.  7.  Tobacco Abuse:  Asked patient and her husband to quit at same time.  Went over nicotine gum for her and nicotine patches for him  Follow up in 3 months

## 2016-08-02 LAB — CBC WITH DIFFERENTIAL/PLATELET
BASOS: 0 %
Basophils Absolute: 0 10*3/uL (ref 0.0–0.2)
EOS (ABSOLUTE): 0.4 10*3/uL (ref 0.0–0.4)
EOS: 7 %
HEMATOCRIT: 40.6 % (ref 34.0–46.6)
HEMOGLOBIN: 13 g/dL (ref 11.1–15.9)
IMMATURE GRANS (ABS): 0 10*3/uL (ref 0.0–0.1)
Immature Granulocytes: 0 %
LYMPHS: 25 %
Lymphocytes Absolute: 1.3 10*3/uL (ref 0.7–3.1)
MCH: 26.1 pg — ABNORMAL LOW (ref 26.6–33.0)
MCHC: 32 g/dL (ref 31.5–35.7)
MCV: 81 fL (ref 79–97)
MONOCYTES: 9 %
Monocytes Absolute: 0.5 10*3/uL (ref 0.1–0.9)
NEUTROS ABS: 3.1 10*3/uL (ref 1.4–7.0)
NEUTROS PCT: 59 %
Platelets: 234 10*3/uL (ref 150–379)
RBC: 4.99 x10E6/uL (ref 3.77–5.28)
RDW: 14.3 % (ref 12.3–15.4)
WBC: 5.3 10*3/uL (ref 3.4–10.8)

## 2016-08-02 LAB — COMPREHENSIVE METABOLIC PANEL
A/G RATIO: 1.9 (ref 1.2–2.2)
ALT: 44 IU/L — AB (ref 0–32)
AST: 32 IU/L (ref 0–40)
Albumin: 4.7 g/dL (ref 3.6–4.8)
Alkaline Phosphatase: 78 IU/L (ref 39–117)
BUN/Creatinine Ratio: 22 (ref 12–28)
BUN: 14 mg/dL (ref 8–27)
Bilirubin Total: 0.3 mg/dL (ref 0.0–1.2)
CALCIUM: 9.5 mg/dL (ref 8.7–10.3)
CO2: 28 mmol/L (ref 18–29)
CREATININE: 0.64 mg/dL (ref 0.57–1.00)
Chloride: 102 mmol/L (ref 96–106)
GFR, EST AFRICAN AMERICAN: 109 mL/min/{1.73_m2} (ref >59)
GFR, EST NON AFRICAN AMERICAN: 95 mL/min/{1.73_m2} (ref >59)
Globulin, Total: 2.5 g/dL (ref 1.5–4.5)
Glucose: 142 mg/dL — ABNORMAL HIGH (ref 65–99)
POTASSIUM: 4.5 mmol/L (ref 3.5–5.2)
Sodium: 144 mmol/L (ref 134–144)
TOTAL PROTEIN: 7.2 g/dL (ref 6.0–8.5)

## 2016-08-02 LAB — HGB A1C W/O EAG: HEMOGLOBIN A1C: 9.6 % — AB (ref 4.8–5.6)

## 2016-08-02 LAB — MICROALBUMIN / CREATININE URINE RATIO
Creatinine, Urine: 193 mg/dL
Microalb/Creat Ratio: 7.9 mg/g creat (ref 0.0–30.0)
Microalbumin, Urine: 15.3 ug/mL

## 2016-08-02 LAB — LIPID PANEL W/O CHOL/HDL RATIO
CHOLESTEROL TOTAL: 131 mg/dL (ref 100–199)
HDL: 39 mg/dL — AB (ref >39)
LDL Calculated: 60 mg/dL (ref 0–99)
TRIGLYCERIDES: 161 mg/dL — AB (ref 0–149)
VLDL CHOLESTEROL CAL: 32 mg/dL (ref 5–40)

## 2016-08-11 NOTE — Progress Notes (Signed)
Left message for patient to call office.  

## 2016-08-23 NOTE — Progress Notes (Signed)
Spoke with husband. Informed of lab results. States he will bring in machine and readings at next appointment.

## 2016-08-24 ENCOUNTER — Encounter: Payer: Self-pay | Admitting: Internal Medicine

## 2016-08-24 ENCOUNTER — Ambulatory Visit (INDEPENDENT_AMBULATORY_CARE_PROVIDER_SITE_OTHER): Payer: Self-pay | Admitting: Internal Medicine

## 2016-08-24 VITALS — BP 112/82 | HR 82 | Temp 98.2°F | Resp 12 | Ht 62.0 in | Wt 142.0 lb

## 2016-08-24 DIAGNOSIS — B349 Viral infection, unspecified: Secondary | ICD-10-CM

## 2016-08-24 NOTE — Patient Instructions (Addendum)
Recommend  Nyquil at night and Dayquil for daytime symptoms.  No extra Tylenol.  May take Ibuprofen 400 mg to 600 mg(2-3 tabs)  Every 6 hours as needed in between dosing of cold remedies for sore throat, body aches.  No cow's milk, no juice for now--can make diarrhea worse  Sleep in recliner

## 2016-08-24 NOTE — Progress Notes (Signed)
   Subjective:    Patient ID: Alyssa Weaver, female    DOB: 1951/05/22, 66 y.o.   MRN: [id number]  HPI   Coughing with production of clear mucous, congestion, sore throat, myalgias.  No headache, nausea.  Perhaps some diarrhea.  No definite fever. Started feeling ill on Sunday, 5 days ago. Did receive flu vaccine beginning of December. Drinking fluids well.  States sugar was down to 50 this morning at about 2 a.m.   She ate some food and back up above 100.   States she is eating, but not as much as usual. Sugars in evening before dinner are 150-160. States maximum has been around 180.  Checked sugar this morning before eating and was 102at 0800.  Has been taking Theraflu (last at 10 a.m. Today)   And Robitussin CF Max Nighttime.  Was appropriately spacing apart  Current Meds  Medication Sig  . atorvastatin (LIPITOR) 80 MG tablet Take 1 tablet (80 mg total) by mouth daily.  . Blood Glucose Monitoring Suppl (AGAMATRIX PRESTO) w/Device KIT Check sugars twice daily before meals  . glucose blood (AGAMATRIX PRESTO TEST) test strip Check sugars twice daily  . Insulin Glargine (LANTUS SOLOSTAR) 100 UNIT/ML Solostar Pen 30 units injected subcutaneously once daily  . loratadine (CLARITIN) 10 MG tablet Take 10 mg by mouth daily.  . meloxicam (MOBIC) 15 MG tablet Take 1 tablet (15 mg total) by mouth daily.  . metFORMIN (GLUCOPHAGE) 1000 MG tablet Take 1 tablet (1,000 mg total) by mouth 2 (two) times daily with a meal.  . omeprazole (PRILOSEC) 40 MG capsule Take 40 mg by mouth daily.   No Known Allergies Review of Systems     Objective:   Physical Exam  NAD, congested HEENT:  PERRL, EOMI, TMs pearly gray, throat without injection or exudate. MMM.  Nasal mucosa swollen, mildly erythematous. Neck:  Supple, No adenopathy Chest:  CTA CV:  RRR without murmur or rub, radial pulses normal and equal Abd:  S, NT, No HSM or mass, + BS Skin:  No rash         Assessment & Plan:  Viral  Syndrome:  Supportive care.  OTC cold remedies.  Discussed ways to stay hydrated. Call if worsens.

## 2016-10-23 ENCOUNTER — Encounter: Payer: Self-pay | Admitting: Internal Medicine

## 2016-10-30 ENCOUNTER — Ambulatory Visit (INDEPENDENT_AMBULATORY_CARE_PROVIDER_SITE_OTHER): Payer: Self-pay | Admitting: Internal Medicine

## 2016-10-30 ENCOUNTER — Encounter: Payer: Self-pay | Admitting: Internal Medicine

## 2016-10-30 VITALS — BP 112/70 | HR 78 | Resp 12 | Ht 62.0 in | Wt 145.0 lb

## 2016-10-30 DIAGNOSIS — E119 Type 2 diabetes mellitus without complications: Secondary | ICD-10-CM

## 2016-10-30 DIAGNOSIS — G8929 Other chronic pain: Secondary | ICD-10-CM

## 2016-10-30 DIAGNOSIS — E782 Mixed hyperlipidemia: Secondary | ICD-10-CM

## 2016-10-30 DIAGNOSIS — Z794 Long term (current) use of insulin: Secondary | ICD-10-CM

## 2016-10-30 DIAGNOSIS — Z9109 Other allergy status, other than to drugs and biological substances: Secondary | ICD-10-CM

## 2016-10-30 DIAGNOSIS — M25512 Pain in left shoulder: Secondary | ICD-10-CM

## 2016-10-30 LAB — GLUCOSE, POCT (MANUAL RESULT ENTRY): POC Glucose: 174 mg/dl — AB (ref 70–99)

## 2016-10-30 MED ORDER — ASPIRIN EC 81 MG PO TBEC: 81.0000 mg | DELAYED_RELEASE_TABLET | Freq: Every day | ORAL | Status: DC

## 2016-10-30 MED ORDER — FISH OIL 1000 MG PO CAPS: ORAL_CAPSULE | ORAL | 0 refills | Status: DC

## 2016-10-30 MED ORDER — CETIRIZINE HCL 10 MG PO TABS: 10.0000 mg | ORAL_TABLET | Freq: Every day | ORAL | 11 refills | Status: DC

## 2016-10-30 NOTE — Progress Notes (Addendum)
Subjective:    Patient ID: Alyssa Weaver, female    DOB: 30-Jun-1951, 66 y.o.   MRN: [id number]  HPI   Husband states they are leaving this Wednesday to stay with daughter in [place], [place]. They will be there for 2 months.  Discussed orange card is only useful in [facility].  They will have to pay out of pocket for any emergency expenses there.   1.  DM:  Is not documenting sugars before dinner in the evening, but does bring in documented sugars from the morning. The lowest sugar this past month was 66 with a high of 154.   She states her sugars in the evening are 150-160.  Never above 200. Up 3 lbs.   Has some sort of exercise machine in her home.  Sounds like both a stationary bike and elliptical trainer.  Uses it once daily for 15-20 minutes.   Has not changed her diet significantly. Taking Lantus 30 units daily.  Currently, giving before lunch.  Not clear why this meal.  2.  Allergies:  Is using Nasonex daily.  Not clear how long she has used regularly.  Perhaps for 1 month. Not clear she is taking Loratadine.  May still have Loratadine left over from [place].    3.  Hyperlipidemia:  Does eat fish twice weekly.  Physical activity as above. Lipid Panel     Component Value Date/Time   CHOL 131 08/01/2016 1013   TRIG 161 (H) 08/01/2016 1013   HDL 39 (L) 08/01/2016 1013   CHOLHDL 2.8 04/17/2016 1558   VLDL 18 04/17/2016 1558   LDLCALC 60 08/01/2016 1013   4.  Left shoulder pain:  Has had at least 3 PT visits.  States still with pain, but husband states did not go the last visit as she felt she was doing better.  Later, states she did not feel well that day. Urged to call if decide unable to make an appointment, preferrably 24 hours ahead of time. States she is doing home exercise program.  Not clear this is the case, however, as she seems uncertain.   Current Meds  Medication Sig  . atorvastatin (LIPITOR) 80 MG tablet Take 1 tablet (80 mg total) by mouth daily.  .  Blood Glucose Monitoring Suppl (AGAMATRIX PRESTO) w/Device KIT Check sugars twice daily before meals  . glucose blood (AGAMATRIX PRESTO TEST) test strip Check sugars twice daily  . Insulin Glargine (LANTUS SOLOSTAR) 100 UNIT/ML Solostar Pen 30 units injected subcutaneously once daily  . loratadine (CLARITIN) 10 MG tablet Take 10 mg by mouth daily.  . meloxicam (MOBIC) 15 MG tablet Take 1 tablet (15 mg total) by mouth daily.  . metFORMIN (GLUCOPHAGE) 1000 MG tablet Take 1 tablet (1,000 mg total) by mouth 2 (two) times daily with a meal.  . mometasone (NASONEX) 50 MCG/ACT nasal spray 2 sprays each nostril daily  . omeprazole (PRILOSEC) 40 MG capsule Take 40 mg by mouth daily.    No Known Allergies    Review of Systems     Objective:   Physical Exam NAD Wiping eyes constantly during history HEENT:  PERRL, EOMI, conjunctivae without injection.  No drainage actually seen.  Throat without injection or significant cobbling.  Nasal mucosa swollen and mildly injected with clear discharge Neck:  Supple, No adenopathy Chest:  CTA CV:  RRR without murmur or rub, radial and DP pulses normal and equal Abd:  S, NT, No HSM or mass, + BS Left shoulder:  Able  to fully abduct today, though with discomfort at full abduction.   LE: No edema       Assessment & Plan:  1.  DM:  A1C.  Sugars appear well controlled, but were previously with an A1C above 9.0%   Suggested moving Lantus to morning dosing, so generally home when would normally give the insulin.  Her sugars are generally lower in the morning, so would not take at night. Needs eye exam, but will schedule when she returns from [facility].  2.  Hyperlipidemia:  Encouraged gradual increase of physical activity to 60 minutes total daily. Also discussed increasing good fat intake with small servings of nuts, avocados, fish 3 times weekly, eggs--not fried.   Fish oil capsules 1000 mg twice daily.  3.  Allergies:  Appears symptomatic--switch  to Zyrtec 10 mg daily, though not clear she is actually taking Loratadine.  Continue Nasonex, but only once daily.  4.  Left shoulder pain:  Was improving based on PT notes and has significantly more movement.  To continue home exercise program.

## 2016-10-31 LAB — HGB A1C W/O EAG: Hgb A1c MFr Bld: 10.5 % — ABNORMAL HIGH (ref 4.8–5.6)

## 2017-02-20 ENCOUNTER — Other Ambulatory Visit: Payer: Self-pay

## 2017-03-19 ENCOUNTER — Ambulatory Visit: Payer: Self-pay | Admitting: Internal Medicine

## 2017-04-04 ENCOUNTER — Encounter: Payer: Self-pay | Admitting: Internal Medicine

## 2017-04-04 ENCOUNTER — Ambulatory Visit (INDEPENDENT_AMBULATORY_CARE_PROVIDER_SITE_OTHER): Payer: Self-pay | Admitting: Internal Medicine

## 2017-04-04 VITALS — BP 138/80 | HR 76 | Resp 12 | Ht 62.0 in | Wt 143.0 lb

## 2017-04-04 DIAGNOSIS — K047 Periapical abscess without sinus: Secondary | ICD-10-CM

## 2017-04-04 MED ORDER — PENICILLIN V POTASSIUM 250 MG PO TABS: 250.0000 mg | ORAL_TABLET | Freq: Four times a day (QID) | ORAL | 0 refills | Status: DC

## 2017-04-04 MED ORDER — MELOXICAM 15 MG PO TABS: 15.0000 mg | ORAL_TABLET | Freq: Every day | ORAL | 3 refills | Status: DC

## 2017-04-04 NOTE — Progress Notes (Signed)
   Subjective:    Patient ID: Alyssa Weaver, female    DOB: 04-26-1951, 66 y.o.   MRN: [id number]  HPI   Upper left tooth causing pain for 2 weeks.  Has had problems with this before.  Does have some swelling of cheek and perhaps of gum.  No drainage.  No fever.   Not clear if she is taking Meloxicam or other medication for pain.  Sounds like helps a bit.  Current Meds  Medication Sig  . atorvastatin (LIPITOR) 80 MG tablet Take 1 tablet (80 mg total) by mouth daily.  . Blood Glucose Monitoring Suppl (AGAMATRIX PRESTO) w/Device KIT Check sugars twice daily before meals  . glucose blood (AGAMATRIX PRESTO TEST) test strip Check sugars twice daily  . Insulin Glargine (LANTUS SOLOSTAR) 100 UNIT/ML Solostar Pen 30 units injected subcutaneously once daily  . metFORMIN (GLUCOPHAGE) 1000 MG tablet Take 1 tablet (1,000 mg total) by mouth 2 (two) times daily with a meal.    No Known Allergies       Review of Systems     Objective:   Physical Exam  NAD HEENT:  PERRL, EOMI, TMs pearly gray throat without injection.  Mild swelling without fluctuance of gingiva about posterior molar, left upper jaw.  Mildly tender to palpation.  Mild swelling of buccal cheek.   Neck:  Supple, mild left anterior cervical adenopathy Chest:  CTA CV:  RRR without murmur        Assessment & Plan:  1.  Abscessed Tooth:  Pen V K 250 mg 4 times daily for 7 days.  To take her Meloxicam daily. She will be back in 2 days for labs, so will get progress report then. Dental referral.

## 2017-04-04 NOTE — Patient Instructions (Signed)
Keep your appointments already set up

## 2017-04-06 ENCOUNTER — Other Ambulatory Visit (INDEPENDENT_AMBULATORY_CARE_PROVIDER_SITE_OTHER): Payer: Self-pay

## 2017-04-06 DIAGNOSIS — Z79899 Other long term (current) drug therapy: Secondary | ICD-10-CM

## 2017-04-06 DIAGNOSIS — E785 Hyperlipidemia, unspecified: Secondary | ICD-10-CM

## 2017-04-07 LAB — COMPREHENSIVE METABOLIC PANEL
A/G RATIO: 1.7 (ref 1.2–2.2)
ALT: 20 IU/L (ref 0–32)
AST: 21 IU/L (ref 0–40)
Albumin: 4.3 g/dL (ref 3.6–4.8)
Alkaline Phosphatase: 63 IU/L (ref 39–117)
BUN/Creatinine Ratio: 24 (ref 12–28)
BUN: 15 mg/dL (ref 8–27)
Bilirubin Total: 0.3 mg/dL (ref 0.0–1.2)
CO2: 24 mmol/L (ref 20–29)
Calcium: 9.3 mg/dL (ref 8.7–10.3)
Chloride: 102 mmol/L (ref 96–106)
Creatinine, Ser: 0.62 mg/dL (ref 0.57–1.00)
GFR, EST AFRICAN AMERICAN: 110 mL/min/{1.73_m2} (ref >59)
GFR, EST NON AFRICAN AMERICAN: 96 mL/min/{1.73_m2} (ref >59)
GLOBULIN, TOTAL: 2.5 g/dL (ref 1.5–4.5)
Glucose: 88 mg/dL (ref 65–99)
Potassium: 4.2 mmol/L (ref 3.5–5.2)
Sodium: 141 mmol/L (ref 134–144)
TOTAL PROTEIN: 6.8 g/dL (ref 6.0–8.5)

## 2017-04-07 LAB — LIPID PANEL W/O CHOL/HDL RATIO
Cholesterol, Total: 106 mg/dL (ref 100–199)
HDL: 33 mg/dL — ABNORMAL LOW (ref >39)
LDL CALC: 37 mg/dL (ref 0–99)
Triglycerides: 181 mg/dL — ABNORMAL HIGH (ref 0–149)
VLDL Cholesterol Cal: 36 mg/dL (ref 5–40)

## 2017-04-13 ENCOUNTER — Ambulatory Visit: Payer: Self-pay | Admitting: Internal Medicine

## 2017-04-18 ENCOUNTER — Ambulatory Visit (INDEPENDENT_AMBULATORY_CARE_PROVIDER_SITE_OTHER): Payer: Self-pay | Admitting: Internal Medicine

## 2017-04-18 ENCOUNTER — Encounter: Payer: Self-pay | Admitting: Internal Medicine

## 2017-04-18 VITALS — BP 110/60 | HR 72 | Ht 62.0 in | Wt 144.5 lb

## 2017-04-18 DIAGNOSIS — E119 Type 2 diabetes mellitus without complications: Secondary | ICD-10-CM

## 2017-04-18 DIAGNOSIS — Z9109 Other allergy status, other than to drugs and biological substances: Secondary | ICD-10-CM

## 2017-04-18 DIAGNOSIS — M25512 Pain in left shoulder: Secondary | ICD-10-CM

## 2017-04-18 DIAGNOSIS — G8929 Other chronic pain: Secondary | ICD-10-CM

## 2017-04-18 DIAGNOSIS — Z794 Long term (current) use of insulin: Secondary | ICD-10-CM

## 2017-04-18 DIAGNOSIS — E785 Hyperlipidemia, unspecified: Secondary | ICD-10-CM

## 2017-04-18 DIAGNOSIS — K047 Periapical abscess without sinus: Secondary | ICD-10-CM

## 2017-04-18 LAB — GLUCOSE, POCT (MANUAL RESULT ENTRY)
POC Glucose: 370 mg/dl — AB (ref 70–99)
POC Glucose: 290 mg/dl — AB (ref 70–99)

## 2017-04-18 MED ORDER — GLUCOSE BLOOD VI STRP: ORAL_STRIP | 12 refills | Status: DC

## 2017-04-18 MED ORDER — AGAMATRIX PRESTO W/DEVICE KIT: PACK | 0 refills | Status: AC

## 2017-04-18 NOTE — Patient Instructions (Signed)
Free flu vaccine at flu vaccine clinics:  September 20 8:30 to 11:30 a.m. And September 29th [facility] 1-4 p.m.  Both at [facility] next door to clinic  

## 2017-04-18 NOTE — Progress Notes (Signed)
Subjective:    Patient ID: Alyssa Weaver, female    DOB: 07/16/1951, 66 y.o.   MRN: [id number]  HPI   1.  DM:  Had really great sugars last visit in April for DM, but A1C was above 10 %. Husband was to bring in glucometer to test against ours, but apparently never did this. Sugars again this time are mainly 80-low 100s. Patient was eating a hard tamarind candy when she came in --sugar was 370 Measured her sugar with her own glucometer about 10 minutes later and was 290. Did not want to change Lantus to morning use, so still using in the evening.  She was afraid her sugars would get too low if she uses the insulin in the morning.  Discussed that was the concern for moving it to the morning as her morning sugars previously were low normal. Continues with Metformin 1000 mg twice daily.   2.  Hyperlipidemia:  Continues on Atorvastatin 80 mg daily. Taking 1200 mgWhen Fish oil only once daily.  Discussed plan was for her to take twice daily. Walks 30-40 minutes daily. Not clear if daily.  When she is tired, she does not walk.   3.  Abscessed Tooth:  Much improved  4.  Allergies:  Cetirizine and Nasonex.  Using Nasonex daily.  Uses Cetirizine only as needed.  Feels allergies controlled with meds.    5.  Left shoulder pain:  Has more PT visits.  Left shoulder bothering her again.  Awakened with the pain.  Meloxicam helps  Current Meds  Medication Sig  . aspirin EC 81 MG tablet Take 1 tablet (81 mg total) by mouth daily. With a meal  . atorvastatin (LIPITOR) 80 MG tablet Take 1 tablet (80 mg total) by mouth daily.  . Blood Glucose Monitoring Suppl (AGAMATRIX PRESTO) w/Device KIT Check sugars twice daily before meals  . cetirizine (ZYRTEC) 10 MG tablet Take 1 tablet (10 mg total) by mouth daily.  Marland Kitchen Dexlansoprazole 30 MG capsule Take 30 mg by mouth daily. On empty stomach  . glucose blood (AGAMATRIX PRESTO TEST) test strip Check sugars twice daily  . Insulin Glargine (LANTUS SOLOSTAR) 100  UNIT/ML Solostar Pen 30 units injected subcutaneously once daily  . meloxicam (MOBIC) 15 MG tablet Take 1 tablet (15 mg total) by mouth daily.  . metFORMIN (GLUCOPHAGE) 1000 MG tablet Take 1 tablet (1,000 mg total) by mouth 2 (two) times daily with a meal.  . mometasone (NASONEX) 50 MCG/ACT nasal spray 2 sprays each nostril daily  . Omega-3 Fatty Acids (FISH OIL) 1000 MG CAPS 1 cap by mouth twice daily    No Known Allergies   Review of Systems     Objective:   Physical Exam  NAD HEENT:  PERRL, EOMI, conjunctivae without injection LUngs:  CTA CV:  RRR without murmur or rub, radial and DP pulses normal and equal Abd:  S, NT, No HSM or mass. + BS LE:  No edema        Assessment & Plan:  1.  DM:  Sugar off close to almost 100 between her glucometer and clinic's today.  Her sugars from home have not correlated with A1C from lab in past year.   Rx for new glucometer from [facility] and test strips.  Discussed not using first drop blood--to wipe away and use the second drop. She will move Lantus to morning dosing.  2.  Hyperlipidemia:  Increase Fish Oil to 1200 twice daily.  3.  Left shoulder pain:  Continuing with PT.  Encouraged to do the exercises regularly to prevent worsening/recurrence.  4.  Allergies:  Controlled with zyrtec and Nasonex.    5.  Abscessed tooth--much better.  Has dental referral in process.  To return in 3 months for CPE with pap

## 2017-04-23 LAB — HGB A1C W/O EAG: HEMOGLOBIN A1C: 10 % — AB (ref 4.8–5.6)

## 2017-05-14 ENCOUNTER — Ambulatory Visit (INDEPENDENT_AMBULATORY_CARE_PROVIDER_SITE_OTHER): Payer: Self-pay | Admitting: Internal Medicine

## 2017-05-14 ENCOUNTER — Encounter: Payer: Self-pay | Admitting: Internal Medicine

## 2017-05-14 VITALS — BP 130/78 | HR 80 | Resp 12 | Ht 62.0 in | Wt 145.0 lb

## 2017-05-14 DIAGNOSIS — R1011 Right upper quadrant pain: Secondary | ICD-10-CM

## 2017-05-14 DIAGNOSIS — K5900 Constipation, unspecified: Secondary | ICD-10-CM

## 2017-05-14 MED ORDER — POLYETHYLENE GLYCOL 3350 17 GM/SCOOP PO POWD: ORAL | 11 refills | Status: DC

## 2017-05-14 MED ORDER — ESOMEPRAZOLE MAGNESIUM 40 MG PO CPDR: DELAYED_RELEASE_CAPSULE | ORAL | 11 refills | Status: DC

## 2017-05-14 NOTE — Progress Notes (Signed)
   Subjective:    Patient ID: Alyssa Weaver, female    DOB: 12-14-1950, 66 y.o.   MRN: [id number]  HPI   Here for RUQ pain for 1 week.  Describes and upward pressure into rib cage with the pain.  Hurts more so when sits.   No associated nausea.  No nausea with eating--just gets burning.  Pain radiates to LUQ, but not to back.  Does not eat fried foods, but spicy food can make the pain worse. Has been constipated.  Stools are hard and she does not pass one for 2-3 days.   Has been constipated for 2 weeks. Later, states this has been going on for long time--years and when she lived in [place].   Gives a history of EGD to check for "infection"  Was to be checked every 8years.  Last time was 2011.   States also had colonoscopy in 2011 and was told everything was okay. Denies melena or hematochezia.  Patient previously has been taking Nexium 40 mg, but has not been able to afford when her husband retired and no longer with insurance.   She was taking the Dexlansoprazole 30 mg and states it does not help.   DM:  A1C last visit about 3 weeks ago still high at 10.0 %.  She did get the new glucometer and test strips.  Taking Lantus in the morning now.  Describes sugars as being 90-160.  Higher sugars are before dinner. Her high sugar was 180.     Current Meds  Medication Sig  . atorvastatin (LIPITOR) 80 MG tablet Take 1 tablet (80 mg total) by mouth daily.  . Blood Glucose Monitoring Suppl (AGAMATRIX PRESTO) w/Device KIT Twice daily sugar checks before meals  . cetirizine (ZYRTEC) 10 MG tablet Take 1 tablet (10 mg total) by mouth daily.  Marland Kitchen Dexlansoprazole 30 MG capsule Take 30 mg by mouth daily. On empty stomach  . glucose blood (AGAMATRIX PRESTO TEST) test strip Twice daily sugar checks before meals  . Insulin Glargine (LANTUS SOLOSTAR) 100 UNIT/ML Solostar Pen 30 units injected subcutaneously once daily  . meloxicam (MOBIC) 15 MG tablet Take 1 tablet (15 mg total) by mouth daily.  . metFORMIN  (GLUCOPHAGE) 1000 MG tablet Take 1 tablet (1,000 mg total) by mouth 2 (two) times daily with a meal.  . mometasone (NASONEX) 50 MCG/ACT nasal spray 2 sprays each nostril daily  . Omega-3 Fatty Acids (FISH OIL) 1000 MG CAPS 1 cap by mouth twice daily    No Known Allergies  Review of Systems     Objective:   Physical Exam NAD Lungs:  CTA CV:  RRR without murmur or rub.  Radial and DP pulses normal and equal Abd:  S, + BS, Minimal midepigastric tenderness, No HSM, negative Murphy's sign,  LE:  No edema       Assessment & Plan:  1.  Abdominal pain: history and physical do not support gallbladder disease.  Likely due to GERD/perhaps gastritis and being off her PPI as does not feel Dexilant is helpful, so stopped. Also, dealing with chronic constipation, which may be adding to her symptoms. Found a coupon for generic Nexium 40 mg daily, which they state they can afford. Check CBC, CMP, TSH Treat constipation as well.  2.  Constipation, chronic:  TSH.  Miralax 17 g daily.  May increase to twice daily if no resulting soft stool. Follow up in 1 month

## 2017-05-15 LAB — CBC WITH DIFFERENTIAL/PLATELET
BASOS ABS: 0 10*3/uL (ref 0.0–0.2)
Basos: 0 %
EOS (ABSOLUTE): 0.5 10*3/uL — AB (ref 0.0–0.4)
Eos: 8 %
HEMOGLOBIN: 11.7 g/dL (ref 11.1–15.9)
Hematocrit: 38.5 % (ref 34.0–46.6)
IMMATURE GRANS (ABS): 0 10*3/uL (ref 0.0–0.1)
IMMATURE GRANULOCYTES: 0 %
LYMPHS: 28 %
Lymphocytes Absolute: 1.8 10*3/uL (ref 0.7–3.1)
MCH: 25.8 pg — ABNORMAL LOW (ref 26.6–33.0)
MCHC: 30.4 g/dL — ABNORMAL LOW (ref 31.5–35.7)
MCV: 85 fL (ref 79–97)
MONOCYTES: 7 %
Monocytes Absolute: 0.4 10*3/uL (ref 0.1–0.9)
NEUTROS PCT: 57 %
Neutrophils Absolute: 3.8 10*3/uL (ref 1.4–7.0)
PLATELETS: 215 10*3/uL (ref 150–379)
RBC: 4.54 x10E6/uL (ref 3.77–5.28)
RDW: 14.1 % (ref 12.3–15.4)
WBC: 6.6 10*3/uL (ref 3.4–10.8)

## 2017-05-15 LAB — COMPREHENSIVE METABOLIC PANEL
ALT: 29 IU/L (ref 0–32)
AST: 26 IU/L (ref 0–40)
Albumin/Globulin Ratio: 2.1 (ref 1.2–2.2)
Albumin: 4.5 g/dL (ref 3.6–4.8)
Alkaline Phosphatase: 68 IU/L (ref 39–117)
BUN/Creatinine Ratio: 24 (ref 12–28)
BUN: 20 mg/dL (ref 8–27)
CALCIUM: 9.3 mg/dL (ref 8.7–10.3)
CHLORIDE: 104 mmol/L (ref 96–106)
CO2: 24 mmol/L (ref 20–29)
CREATININE: 0.82 mg/dL (ref 0.57–1.00)
GFR, EST AFRICAN AMERICAN: 87 mL/min/{1.73_m2} (ref >59)
GFR, EST NON AFRICAN AMERICAN: 76 mL/min/{1.73_m2} (ref >59)
GLUCOSE: 319 mg/dL — AB (ref 65–99)
Globulin, Total: 2.1 g/dL (ref 1.5–4.5)
Potassium: 4.2 mmol/L (ref 3.5–5.2)
Sodium: 145 mmol/L — ABNORMAL HIGH (ref 134–144)
TOTAL PROTEIN: 6.6 g/dL (ref 6.0–8.5)

## 2017-05-15 LAB — TSH: TSH: 0.135 u[IU]/mL — ABNORMAL LOW (ref 0.450–4.500)

## 2017-05-16 LAB — T4, FREE: Free T4: 1.43 ng/dL (ref 0.82–1.77)

## 2017-05-16 LAB — SPECIMEN STATUS REPORT

## 2017-06-12 ENCOUNTER — Ambulatory Visit (INDEPENDENT_AMBULATORY_CARE_PROVIDER_SITE_OTHER): Payer: Medicare Other | Admitting: Internal Medicine

## 2017-06-12 ENCOUNTER — Encounter: Payer: Self-pay | Admitting: Internal Medicine

## 2017-06-12 VITALS — BP 122/62 | HR 88 | Resp 12 | Ht 62.0 in | Wt 144.0 lb

## 2017-06-12 DIAGNOSIS — G8929 Other chronic pain: Secondary | ICD-10-CM | POA: Diagnosis not present

## 2017-06-12 DIAGNOSIS — K2970 Gastritis, unspecified, without bleeding: Secondary | ICD-10-CM | POA: Diagnosis not present

## 2017-06-12 DIAGNOSIS — M25512 Pain in left shoulder: Secondary | ICD-10-CM | POA: Diagnosis not present

## 2017-06-12 DIAGNOSIS — Z794 Long term (current) use of insulin: Secondary | ICD-10-CM | POA: Diagnosis not present

## 2017-06-12 DIAGNOSIS — K5900 Constipation, unspecified: Secondary | ICD-10-CM | POA: Diagnosis not present

## 2017-06-12 DIAGNOSIS — E119 Type 2 diabetes mellitus without complications: Secondary | ICD-10-CM | POA: Diagnosis not present

## 2017-06-12 NOTE — Progress Notes (Signed)
Subjective:    Patient ID: Alyssa Weaver, female    DOB: [birth year], 66 y.o.   MRN: [id number]  HPI   1.  Abdominal pain:  GERD/gastritis:  Feeling much better with Nexium.  States RUQ pain is now gone. Lab work was normal.  2.  Constipation:  Using Miralax once to twice daily and her stools soft, but formed and much easier to pass.  Labwork was normal.    3.  High left and sometimes right chest discomfort:  Started a couple of days ago.  States she feels cold there. This is when she is sleeping.  Her husband states he has to put the electric heater near her and the cold sensation goes away.    4.  Left shoulder pain:  Has been 4 times to PT.  Her shoulder is better.  5.  DM:  Last A1C 04/18/2017 was 10.0 %.  States sugars are in low 100s--checked her glucometer and most of her sugars are in the 80s to low 100s, but all in the morning.   Did get influenza vaccine She has her follow up for CPE next month.  Current Meds  Medication Sig  . atorvastatin (LIPITOR) 80 MG tablet Take 1 tablet (80 mg total) by mouth daily.  . Blood Glucose Monitoring Suppl (AGAMATRIX PRESTO) w/Device KIT Twice daily sugar checks before meals  . cetirizine (ZYRTEC) 10 MG tablet Take 1 tablet (10 mg total) by mouth daily.  Marland Kitchen esomeprazole (NEXIUM) 40 MG capsule 1 capsule by mouth on an empty stomach  . glucose blood (AGAMATRIX PRESTO TEST) test strip Twice daily sugar checks before meals  . Insulin Glargine (LANTUS SOLOSTAR) 100 UNIT/ML Solostar Pen 30 units injected subcutaneously once daily  . meloxicam (MOBIC) 15 MG tablet Take 1 tablet (15 mg total) by mouth daily.  . metFORMIN (GLUCOPHAGE) 1000 MG tablet Take 1 tablet (1,000 mg total) by mouth 2 (two) times daily with a meal.  . mometasone (NASONEX) 50 MCG/ACT nasal spray 2 sprays each nostril daily  . Omega-3 Fatty Acids (FISH OIL) 1000 MG CAPS 1 cap by mouth twice daily  . polyethylene glycol powder (GLYCOLAX/MIRALAX) powder 17 g in 8 ounces fluid once  daily   No Known Allergies      Review of Systems     Objective:   Physical Exam  NAD Lungs:  CTA CV:  RRR without murmur or rub, radial pulses normal and equal Abd:  S, NT, No HSM or mass, + BS LE:  No edema        Assessment & Plan:  1.  Gastritis/GERD:  Continue generic Nexium.  Much improved  2.  Constipation:  Controlled nicely with Miralax.  3.  Left shoulder pain:  Improved with PT  4.  DM:  Only checking sugars fasting in the morning.  Her sugars are really good again, but her A1C was 10.0% just 2 months ago, so concerned sugars are higher in the afternoon.  She is due for recheck of A1C next month.  No change to Lantus or Metformin for now.

## 2017-06-18 ENCOUNTER — Telehealth: Payer: Self-pay | Admitting: Internal Medicine

## 2017-06-18 ENCOUNTER — Other Ambulatory Visit: Payer: Self-pay

## 2017-06-18 MED ORDER — INSULIN GLARGINE 100 UNIT/ML SOLOSTAR PEN: PEN_INJECTOR | SUBCUTANEOUS | 99 refills | Status: DC

## 2017-06-18 NOTE — Telephone Encounter (Signed)
Rx was sent to pharmacy. 

## 2017-06-18 NOTE — Telephone Encounter (Signed)
Patient needs refill for Insulin Glargine (LANTUS SOLOSTAR) 100 UNIT/ML Solostar Pen [[id number]] send to [place]. Please sent ASAP so patient can pick up before Wed. Since pharmacy will be closed for the [facility]

## 2017-07-05 ENCOUNTER — Telehealth: Payer: Self-pay

## 2017-07-05 MED ORDER — ESOMEPRAZOLE MAGNESIUM 40 MG PO CPDR: DELAYED_RELEASE_CAPSULE | ORAL | 3 refills | Status: DC

## 2017-07-05 MED ORDER — GLUCOSE BLOOD VI STRP: ORAL_STRIP | 3 refills | Status: DC

## 2017-07-05 MED ORDER — ATORVASTATIN CALCIUM 80 MG PO TABS: 80.0000 mg | ORAL_TABLET | Freq: Every day | ORAL | 3 refills | Status: DC

## 2017-07-05 MED ORDER — INSULIN GLARGINE 100 UNIT/ML SOLOSTAR PEN: PEN_INJECTOR | SUBCUTANEOUS | 99 refills | Status: DC

## 2017-07-05 NOTE — Telephone Encounter (Signed)
Rx's sent to pharmacy.  

## 2017-07-05 NOTE — Telephone Encounter (Signed)
Patient daughter called stating her mother now has medicare and will be changing doctors. Patient needs her medications sent to her new pharmacy until she can get in with new physician. Pharmacy is [facility] Phone # [phone] and fax # [phone].

## 2017-07-05 NOTE — Telephone Encounter (Signed)
Per Dr. Amil Amen ok to send Rx's.

## 2017-07-16 ENCOUNTER — Ambulatory Visit: Payer: Self-pay | Admitting: Internal Medicine

## 2017-08-14 ENCOUNTER — Other Ambulatory Visit: Payer: Self-pay

## 2017-08-14 MED ORDER — METFORMIN HCL 1000 MG PO TABS: 1000.0000 mg | ORAL_TABLET | Freq: Two times a day (BID) | ORAL | 5 refills | Status: DC

## 2017-08-24 ENCOUNTER — Encounter: Payer: Self-pay | Admitting: Internal Medicine

## 2017-08-24 ENCOUNTER — Ambulatory Visit: Payer: Medicare Other | Admitting: Internal Medicine

## 2017-08-24 VITALS — BP 122/78 | HR 82 | Resp 12 | Ht 62.0 in | Wt 146.0 lb

## 2017-08-24 DIAGNOSIS — Z1211 Encounter for screening for malignant neoplasm of colon: Secondary | ICD-10-CM

## 2017-08-24 DIAGNOSIS — Z1239 Encounter for other screening for malignant neoplasm of breast: Secondary | ICD-10-CM

## 2017-08-24 DIAGNOSIS — E119 Type 2 diabetes mellitus without complications: Secondary | ICD-10-CM

## 2017-08-24 DIAGNOSIS — Z0000 Encounter for general adult medical examination without abnormal findings: Secondary | ICD-10-CM

## 2017-08-24 DIAGNOSIS — Z794 Long term (current) use of insulin: Secondary | ICD-10-CM

## 2017-08-24 DIAGNOSIS — Z1231 Encounter for screening mammogram for malignant neoplasm of breast: Secondary | ICD-10-CM

## 2017-08-24 DIAGNOSIS — Z124 Encounter for screening for malignant neoplasm of cervix: Secondary | ICD-10-CM

## 2017-08-24 MED ORDER — CALCIUM CITRATE-VITAMIN D 250-200 MG-UNIT PO TABS: ORAL_TABLET | ORAL | Status: AC

## 2017-08-24 NOTE — Progress Notes (Signed)
Subjective:    Patient ID: Alyssa Weaver, female    DOB: 04-Mar-1951, 67 y.o.   MRN: [id number]  HPI   CPE with pap  1.  Pap:  Last 2002.  Has never had and abnormal.  No family history of cervical cancer.  2.  Mammogram:  Last was in 2008.  Always normal.  No family history of breast cancer.  3.  Osteoprevention: No history of DEXA.  Occasional milk.    4.  Guaiac Cards:  Never.  No family history of colon cancer.  5.  Colonoscopy:  Never.  As above  6.  Immunizations:  Received influenza vaccine in the fall.  No pneumococcal, either 13 or 23 V  7.  Glucose/Cholesterol:  DM with poor control.  Hyperlipidemia.  Everything at goal save for triglycerides 03/2017 Lipid Panel     Component Value Date/Time   CHOL 106 04/06/2017 0841   TRIG 181 (H) 04/06/2017 0841   HDL 33 (L) 04/06/2017 0841   CHOLHDL 2.8 04/17/2016 1558   VLDL 18 04/17/2016 1558   LDLCALC 37 04/06/2017 0841    Current Meds  Medication Sig  . atorvastatin (LIPITOR) 80 MG tablet Take 1 tablet (80 mg total) by mouth daily.  . Blood Glucose Monitoring Suppl (AGAMATRIX PRESTO) w/Device KIT Twice daily sugar checks before meals  . cetirizine (ZYRTEC) 10 MG tablet Take 1 tablet (10 mg total) by mouth daily.  Marland Kitchen esomeprazole (NEXIUM) 40 MG capsule 1 capsule by mouth on an empty stomach  . glucose blood (AGAMATRIX PRESTO TEST) test strip Twice daily sugar checks before meals  . ibuprofen (ADVIL,MOTRIN) 200 MG tablet Take 200 mg by mouth every 6 (six) hours as needed.  . Insulin Glargine (LANTUS SOLOSTAR) 100 UNIT/ML Solostar Pen 30 units injected subcutaneously once daily  . metFORMIN (GLUCOPHAGE) 1000 MG tablet Take 1 tablet (1,000 mg total) by mouth 2 (two) times daily with a meal.  . mometasone (NASONEX) 50 MCG/ACT nasal spray 2 sprays each nostril daily  . Omega-3 Fatty Acids (FISH OIL) 1000 MG CAPS 1 cap by mouth twice daily  . polyethylene glycol powder (GLYCOLAX/MIRALAX) powder 17 g in 8 ounces fluid once  daily    No Known Allergies   Past Medical History:  Diagnosis Date  . Diabetes mellitus without complication ([place])   . Hyperlipidemia     Past Surgical History:  Procedure Laterality Date  . TUBAL LIGATION  1985    Family History  Problem Relation Age of Onset  . Asthma Brother     Social History   Socioeconomic History  . Marital status: Married    Spouse name: [name]  . Number of children: 5  . Years of education: 37  . Highest education level: Not on file  Social Needs  . Financial resource strain: Not on file  . Food insecurity - worry: Not on file  . Food insecurity - inability: Not on file  . Transportation needs - medical: Not on file  . Transportation needs - non-medical: Not on file  Occupational History  . Occupation: housewife  Tobacco Use  . Smoking status: Current Every Day Smoker    Packs/day: 0.25    Years: 25.00    Pack years: 6.25    Types: Cigarettes  . Smokeless tobacco: Never Used  . Tobacco comment: Husband smokes 1 ppd.  He might be willing to quit at same time.  Substance and Sexual Activity  . Alcohol use: No  . Drug use: No  .  Sexual activity: Yes    Birth control/protection: Post-menopausal  Other Topics Concern  . Not on file  Social History Narrative   Originally from [place]   Refugees--lived in [place] 2 years.   Came to the [place] In [place].   Lived in the [place] until moving to [facility] Nov. 2016    Review of Systems  Constitutional: Negative for appetite change, fatigue, fever and unexpected weight change.  HENT: Positive for postnasal drip, rhinorrhea (For past 2 days with congestion and cough.  No fevers..  No myalgias.) and sneezing. Negative for dental problem and hearing loss.   Eyes: Negative for visual disturbance.       Last eye appointment was 2017. Denies history of diabetic changes.  Respiratory: Positive for cough. Negative for shortness of breath.   Cardiovascular: Negative for chest pain, palpitations and leg  swelling.       No PND or orthopnea  Gastrointestinal: Negative for abdominal pain, blood in stool (no melena), diarrhea and nausea.  Genitourinary: Negative for dysuria, flank pain and vaginal discharge.  Neurological:       Left shoulder pain with radiation of numbness and tingling into her left hand for past 2 months. No numbness and tingling in feet  Checks feet nightly  Hematological: Negative for adenopathy.  Psychiatric/Behavioral: Negative for dysphoric mood. The patient is not nervous/anxious.        Objective:   Physical Exam  Constitutional: She is oriented to person, place, and time. She appears well-developed and well-nourished.  HENT:  Head: Normocephalic and atraumatic.  Right Ear: Hearing, tympanic membrane, external ear and ear canal normal.  Left Ear: Hearing, tympanic membrane, external ear and ear canal normal.  Nose: Mucosal edema and rhinorrhea present. Right sinus exhibits no maxillary sinus tenderness and no frontal sinus tenderness. Left sinus exhibits no maxillary sinus tenderness and no frontal sinus tenderness.  Mouth/Throat: Uvula is midline and oropharynx is clear and moist.  Eyes: Conjunctivae and EOM are normal. Pupils are equal, round, and reactive to light.  Discs sharp bilaterally  Neck: Normal range of motion and full passive range of motion without pain. Neck supple. No thyromegaly present.  Cardiovascular: Normal rate, regular rhythm, S1 normal and S2 normal. Exam reveals no S3, no S4 and no friction rub.  No murmur heard. No carotid bruits.  Carotid, radial, femoral, DP and PT pulses normal and equal.   Pulmonary/Chest: Effort normal and breath sounds normal. Right breast exhibits no inverted nipple, no mass, no nipple discharge, no skin change and no tenderness. Left breast exhibits no inverted nipple, no mass, no nipple discharge, no skin change and no tenderness.  Abdominal: Soft. Bowel sounds are normal. She exhibits no mass. There is no  hepatosplenomegaly. There is no tenderness. No hernia.  Genitourinary: Rectal exam shows no mass, anal tone normal and guaiac negative stool.  Genitourinary Comments: Normal external genitalia.  No cervical lesions. No uterine or adnexal mass or tenderness.   Musculoskeletal: Normal range of motion. She exhibits no edema.  Lymphadenopathy:       Head (right side): No submental and no submandibular adenopathy present.       Head (left side): No submental and no submandibular adenopathy present.    She has no cervical adenopathy.    She has no axillary adenopathy.       Right: No inguinal and no supraclavicular adenopathy present.       Left: No inguinal and no supraclavicular adenopathy present.  Neurological: She is alert and  oriented to person, place, and time. She has normal strength and normal reflexes. No cranial nerve deficit or sensory deficit. Coordination and gait normal.  Diabetic Foot Exam - Simple   Simple Foot Form Diabetic Foot exam was performed with the following findings:  Yes  08/24/2016 11:57 AM  Visual Inspection No deformities, no ulcerations, no other skin breakdown bilaterally:  Yes Sensation Testing Intact to touch and monofilament testing bilaterally:  Yes Pulse Check Posterior Tibialis and Dorsalis pulse intact bilaterally:  Yes Comments    Skin: Skin is warm and dry. No rash noted.  Psychiatric: She has a normal mood and affect. Her speech is normal and behavior is normal. Judgment and thought content normal.          Assessment & Plan:  1.  CPE with pap Schedule mammogram Referral for screening colonoscopy Citracal with Vitamin D 500 mg twice daily Regular weight bearing exercise  Recommended PCV [facility] at [facility] vs pharmacy and Pneumococcal 23 v in 1 year.  Check on Tdap/Td  2.  Tobacco use:  Discussed cessation again.  Not interested.  3.  DM:  A1C, urine microalbumin/crea  4.  URI vs allergies:  Symptomatic treatment with otc cold remedies.

## 2017-08-24 NOTE — Patient Instructions (Addendum)
Tobacco Cessation:   1800QUITNOW or [phone], the former for support and possibly free nicotine patches/gum and support; the latter for [facility] Smoking cessation class. Get rid of all smoking supplies:  Cigarettes, lighters, ashtrays--no stashes just in case at home if you are serious.For Nicotine gum:  When you feel need for smoking:  Place nicotine gum in mouth and chew until juicy, then stick between gum and cheek.  You will absorb the nicotine from your mouth.   Do not aggressively chew gum. Spit gum out in garbage once you feel you are done with it.  Can google "advance directives, [place]"  And bring up form from Secretary of [place]. Print and fill out Or can go to "5 wishes"  Which is also in Spanish and fill out--this costs $5--perhaps easier to use. Designate a Medical Power of Attorney to speak for you if you are unable to speak for yourself when ill or injured

## 2017-08-25 LAB — MICROALBUMIN / CREATININE URINE RATIO
Creatinine, Urine: 109.4 mg/dL
Microalb/Creat Ratio: 6.9 mg/g creat (ref 0.0–30.0)
Microalbumin, Urine: 7.5 ug/mL

## 2017-08-25 LAB — HGB A1C W/O EAG: HEMOGLOBIN A1C: 9.8 % — AB (ref 4.8–5.6)

## 2017-08-27 LAB — CYTOLOGY - PAP

## 2017-09-25 ENCOUNTER — Encounter: Payer: Self-pay | Admitting: Gastroenterology

## 2017-10-08 ENCOUNTER — Encounter: Payer: Self-pay | Admitting: Physician Assistant

## 2017-10-08 ENCOUNTER — Other Ambulatory Visit: Payer: Self-pay

## 2017-10-08 ENCOUNTER — Ambulatory Visit: Payer: Medicare Other | Admitting: Physician Assistant

## 2017-10-08 VITALS — BP 113/68 | HR 85 | Temp 98.8°F | Resp 16 | Ht 63.0 in | Wt 144.0 lb

## 2017-10-08 DIAGNOSIS — J069 Acute upper respiratory infection, unspecified: Secondary | ICD-10-CM | POA: Diagnosis not present

## 2017-10-08 DIAGNOSIS — R05 Cough: Secondary | ICD-10-CM

## 2017-10-08 DIAGNOSIS — J029 Acute pharyngitis, unspecified: Secondary | ICD-10-CM | POA: Diagnosis not present

## 2017-10-08 DIAGNOSIS — R059 Cough, unspecified: Secondary | ICD-10-CM

## 2017-10-08 LAB — POCT RAPID STREP A (OFFICE): Rapid Strep A Screen: NEGATIVE

## 2017-10-08 MED ORDER — BENZONATATE 100 MG PO CAPS: 100.0000 mg | ORAL_CAPSULE | Freq: Three times a day (TID) | ORAL | 0 refills | Status: DC | PRN

## 2017-10-08 MED ORDER — HYDROCODONE-HOMATROPINE 5-1.5 MG/5ML PO SYRP: 5.0000 mL | ORAL_SOLUTION | Freq: Three times a day (TID) | ORAL | 0 refills | Status: DC | PRN

## 2017-10-08 MED ORDER — BENZOCAINE-MENTHOL 15-4 MG MT LOZG: 1.0000 | LOZENGE | OROMUCOSAL | 0 refills | Status: DC

## 2017-10-08 NOTE — Progress Notes (Signed)
Alyssa Weaver  MRN: [id number] DOB: [birth year]  PCP: Mack Hook, MD  Subjective:  Pt is a 67 year old female who presents to clinic for sore throat x 3 days.  Cough also for the past 3 days. Cough is keeping her up at night.  Endorses wheezing.   She has been taking otc cough medicine.  Her grandchildren have been sick recently.   Review of Systems  Constitutional: Negative for chills, diaphoresis, fatigue and fever.  HENT: Positive for sore throat. Negative for congestion, postnasal drip, rhinorrhea, sinus pressure and sinus pain.   Respiratory: Positive for cough. Negative for chest tightness, shortness of breath and wheezing.   Psychiatric/Behavioral: Negative for sleep disturbance.    Patient Active Problem List   Diagnosis Date Noted  . Gastritis without bleeding 06/12/2017  . Environmental allergies   . Type 2 diabetes mellitus without complication, with long-term current use of insulin ([place]) 01/04/2016  . Hyperlipidemia 01/04/2016  . Left shoulder pain 01/04/2016    Current Outpatient Medications on File Prior to Visit  Medication Sig Dispense Refill  . aspirin EC 81 MG tablet Take 1 tablet (81 mg total) by mouth daily. With a meal    . atorvastatin (LIPITOR) 80 MG tablet Take 1 tablet (80 mg total) by mouth daily. 90 tablet 3  . Blood Glucose Monitoring Suppl (AGAMATRIX PRESTO) w/Device KIT Twice daily sugar checks before meals 1 kit 0  . Calcium Citrate-Vitamin D (CITRACAL/VITAMIN D) 250-200 MG-UNIT TABS 2 tabs by mouth twice daily 120 each   . cetirizine (ZYRTEC) 10 MG tablet Take 1 tablet (10 mg total) by mouth daily. 30 tablet 11  . esomeprazole (NEXIUM) 40 MG capsule 1 capsule by mouth on an empty stomach 90 capsule 3  . glucose blood (AGAMATRIX PRESTO TEST) test strip Twice daily sugar checks before meals 300 each 3  . Insulin Glargine (LANTUS SOLOSTAR) 100 UNIT/ML Solostar Pen 30 units injected subcutaneously once daily 5 pen PRN  . meloxicam  (MOBIC) 15 MG tablet Take 1 tablet (15 mg total) by mouth daily. 30 tablet 3  . metFORMIN (GLUCOPHAGE) 1000 MG tablet Take 1 tablet (1,000 mg total) by mouth 2 (two) times daily with a meal. 60 tablet 5  . mometasone (NASONEX) 50 MCG/ACT nasal spray 2 sprays each nostril daily 17 g 12  . Omega-3 Fatty Acids (FISH OIL) 1000 MG CAPS 1 cap by mouth twice daily  0  . ibuprofen (ADVIL,MOTRIN) 200 MG tablet Take 200 mg by mouth every 6 (six) hours as needed.    . polyethylene glycol powder (GLYCOLAX/MIRALAX) powder 17 g in 8 ounces fluid once daily (Patient not taking: Reported on 10/08/2017) 578 g 11   No current facility-administered medications on file prior to visit.     No Known Allergies   Objective:  BP 113/68   Pulse 85   Temp 98.8 F (37.1 C) (Oral)   Resp 16   Ht _0  (1.6 m)   Wt 144 lb (65.3 kg)   SpO2 97%   BMI 25.51 kg/m   Physical Exam  Constitutional: She is oriented to person, place, and time and well-developed, well-nourished, and in no distress. No distress.  HENT:  Right Ear: Tympanic membrane normal.  Left Ear: Tympanic membrane normal.  Nose: Mucosal edema present. No rhinorrhea. Right sinus exhibits no maxillary sinus tenderness and no frontal sinus tenderness. Left sinus exhibits no maxillary sinus tenderness and no frontal sinus tenderness.  Mouth/Throat: Oropharynx is clear and moist and mucous  membranes are normal.  Cardiovascular: Normal rate, regular rhythm and normal heart sounds.  Pulmonary/Chest: Effort normal and breath sounds normal. No respiratory distress. She has no wheezes. She has no rales.  Neurological: She is alert and oriented to person, place, and time. GCS score is 15.  Skin: Skin is warm and dry.  Psychiatric: Mood, memory, affect and judgment normal.  Vitals reviewed.   Results for orders placed or performed in visit on 10/08/17  POCT rapid strep A  Result Value Ref Range   Rapid Strep A Screen Negative Negative    Assessment and  Plan :  1. Acute upper respiratory infection 2. Sore throat - POCT rapid strep A - Culture, Group A Strep - Benzocaine-Menthol 15-4 MG LOZG; Use as directed 1 lozenge in the mouth or throat every 4 (four) hours.  Dispense: 9 lozenge; Refill: 0 - Pt presents with cough and sore throat x 5 days. Rapid strep is negative. Culture pending. Plan to treat supportively. RTC in 5-7 days if no improvement.  3. Cough - HYDROcodone-homatropine (HYCODAN) 5-1.5 MG/5ML syrup; Take 5 mLs by mouth every 8 (eight) hours as needed for cough.  Dispense: 120 mL; Refill: 0 - benzonatate (TESSALON) 100 MG capsule; Take 1-2 capsules (100-200 mg total) by mouth 3 (three) times daily as needed for cough.  Dispense: 40 capsule; Refill: 0    Mercer Pod, PA-C  [facility] 10/08/2017 2:58 PM

## 2017-10-08 NOTE — Patient Instructions (Addendum)
Hycodan is for cough at night. This may make you drowsy. Only take this as directed as night.  Tessalon (benzonatate) is for cough during the day. Take this as needed.  Cepacol is for sore throat. Take this as needed.  Stay well hydrated. Come back if you are not better in 5-7 days.   Stay well hydrated. Get lost of rest. Wash your hands often.   -Foods that can help speed recovery: honey, garlic, chicken soup, elderberries, green tea.  -Supplements that can help speed recovery: vitamin C, zinc, elderberry extract, quercetin, ginseng, selenium -Supplement with prebiotics and probiotics:   Advil or ibuprofen for pain. Do not take Aspirin.  Drink enough water and fluids to keep your urine clear or pale yellow.  For sore throat: ? Gargle with 8 oz of salt water ( tsp of salt per 1 qt of water) as often as every 1-2 hours to soothe your throat.  Gargle liquid benadryl.  Cepacol throat lozenges (if you are not at risk for choking).  For sore throat try using a honey-based tea. Use 3 teaspoons of honey with juice squeezed from half lemon. Place shaved pieces of ginger into 1/2-1 cup of water and warm over stove top. Then mix the ingredients and repeat every 4 hours as needed.  Cough Syrup Recipe: Sweet Lemon & Honey Thyme  Ingredients a handful of fresh thyme sprigs   1 pint of water (2 cups)  1/2 cup honey (raw is best, but regular will do)  1/2 lemon chopped Instructions 1. Place the lemon in the pint jar and cover with the honey. The honey will macerate the lemons and draw out liquids which taste so delicious! 2. Meanwhile, toss the thyme leaves into a saucepan and cover them with the water. 3. Bring the water to a gentle simmer and reduce it to half, about a cup of tea. 4. When the tea is reduced and cooled a bit, strain the sprigs & leaves, add it into the pint jar and stir it well. 5. Give it a shake and use a spoonful as needed. 6. Store your homemade cough syrup in the  refrigerator for about a month.  What causes a cough? In adults, common causes of a cough include: ?An infection of the airways or lungs (such as the common cold) ?Postnasal drip - Postnasal drip is when mucus from the nose drips down or flows along the back of the throat. Postnasal drip can happen when people have: .A cold .Allergies .A sinus infection - The sinuses are hollow areas in the bones of the face that open into the nose. ?Lung conditions, like asthma and chronic obstructive pulmonary disease (COPD) - Both of these conditions can make it hard to breathe. COPD is usually caused by smoking. ?Acid reflux - Acid reflux is when the acid that is normally in your stomach backs up into your esophagus (the tube that carries food from your mouth to your stomach). ?A side effect from blood pressure medicines called "ACE inhibitors" ?Smoking cigarettes  Is there anything I can do on my own to get rid of my cough? Yes. To help get rid of your cough, you can: ?Use a humidifier in your bedroom ?Use an over-the-counter cough medicine, or suck on cough drops or hard candy ?Stop smoking, if you smoke ?If you have allergies, avoid the things you are allergic to (like pollen, dust, animals, or mold) If you have acid reflux, your doctor or nurse will tell you which lifestyle changes  can help reduce symptoms.     IF you received an x-ray today, you will receive an invoice from [facility]. Please contact [facility] at [phone] with questions or concerns regarding your invoice.   IF you received labwork today, you will receive an invoice from [facility]. Please contact [facility] at [phone] with questions or concerns regarding your invoice.   Our billing staff will not be able to assist you with questions regarding bills from these companies.  You will be contacted with the lab results as soon as they are available. The fastest way to get your results is to activate your  My Chart account. Instructions are located on the last page of this paperwork. If you have not heard from [place] regarding the results in 2 weeks, please contact this office.

## 2017-10-10 ENCOUNTER — Ambulatory Visit
Admission: RE | Admit: 2017-10-10 | Discharge: 2017-10-10 | Disposition: A | Discharge status: Home / Self Care | Payer: Medicare Other | Source: Ambulatory Visit | Attending: Internal Medicine | Admitting: Internal Medicine

## 2017-10-10 DIAGNOSIS — Z1239 Encounter for other screening for malignant neoplasm of breast: Secondary | ICD-10-CM

## 2017-10-10 DIAGNOSIS — Z1231 Encounter for screening mammogram for malignant neoplasm of breast: Secondary | ICD-10-CM | POA: Diagnosis not present

## 2017-10-10 LAB — CULTURE, GROUP A STREP: Strep A Culture: NEGATIVE

## 2017-11-22 ENCOUNTER — Other Ambulatory Visit (INDEPENDENT_AMBULATORY_CARE_PROVIDER_SITE_OTHER): Payer: Medicare Other

## 2017-11-22 DIAGNOSIS — E785 Hyperlipidemia, unspecified: Secondary | ICD-10-CM

## 2017-11-22 DIAGNOSIS — E119 Type 2 diabetes mellitus without complications: Secondary | ICD-10-CM | POA: Diagnosis not present

## 2017-11-22 DIAGNOSIS — Z794 Long term (current) use of insulin: Secondary | ICD-10-CM | POA: Diagnosis not present

## 2017-11-23 LAB — LIPID PANEL W/O CHOL/HDL RATIO
CHOLESTEROL TOTAL: 117 mg/dL (ref 100–199)
HDL: 35 mg/dL — AB (ref >39)
LDL CALC: 55 mg/dL (ref 0–99)
TRIGLYCERIDES: 134 mg/dL (ref 0–149)
VLDL CHOLESTEROL CAL: 27 mg/dL (ref 5–40)

## 2017-11-23 LAB — HGB A1C W/O EAG: Hgb A1c MFr Bld: 10 % — ABNORMAL HIGH (ref 4.8–5.6)

## 2017-11-27 ENCOUNTER — Encounter: Payer: Self-pay | Admitting: Gastroenterology

## 2017-11-28 MED ORDER — INSULIN GLARGINE 100 UNIT/ML SOLOSTAR PEN: PEN_INJECTOR | SUBCUTANEOUS | 99 refills | Status: DC

## 2017-11-28 NOTE — Addendum Note (Signed)
Addended by: Marcelino Duster on: 11/28/2017 09:12 AM   Modules accepted: Orders

## 2017-11-30 ENCOUNTER — Ambulatory Visit: Payer: Medicare Other | Admitting: Internal Medicine

## 2017-12-07 ENCOUNTER — Other Ambulatory Visit: Payer: Self-pay

## 2017-12-07 ENCOUNTER — Encounter: Payer: Self-pay | Admitting: Family Medicine

## 2017-12-07 ENCOUNTER — Ambulatory Visit (INDEPENDENT_AMBULATORY_CARE_PROVIDER_SITE_OTHER): Payer: Medicare Other | Admitting: Family Medicine

## 2017-12-07 VITALS — BP 120/70 | HR 75 | Temp 98.0°F | Resp 18 | Ht 63.0 in | Wt 144.4 lb

## 2017-12-07 DIAGNOSIS — R7989 Other specified abnormal findings of blood chemistry: Secondary | ICD-10-CM | POA: Diagnosis not present

## 2017-12-07 DIAGNOSIS — K2970 Gastritis, unspecified, without bleeding: Secondary | ICD-10-CM

## 2017-12-07 DIAGNOSIS — Z794 Long term (current) use of insulin: Secondary | ICD-10-CM | POA: Diagnosis not present

## 2017-12-07 DIAGNOSIS — F17200 Nicotine dependence, unspecified, uncomplicated: Secondary | ICD-10-CM

## 2017-12-07 DIAGNOSIS — Z23 Encounter for immunization: Secondary | ICD-10-CM | POA: Diagnosis not present

## 2017-12-07 DIAGNOSIS — E119 Type 2 diabetes mellitus without complications: Secondary | ICD-10-CM

## 2017-12-07 DIAGNOSIS — J301 Allergic rhinitis due to pollen: Secondary | ICD-10-CM

## 2017-12-07 DIAGNOSIS — E782 Mixed hyperlipidemia: Secondary | ICD-10-CM | POA: Diagnosis not present

## 2017-12-07 LAB — GLUCOSE, POCT (MANUAL RESULT ENTRY): POC Glucose: 305 mg/dl — AB (ref 70–99)

## 2017-12-07 MED ORDER — CETIRIZINE HCL 10 MG PO TABS
10.0000 mg | ORAL_TABLET | Freq: Every day | ORAL | 11 refills | Status: DC
Start: 2017-12-07 — End: 2019-01-27

## 2017-12-07 MED ORDER — INSULIN GLARGINE 100 UNIT/ML SOLOSTAR PEN: 20.0000 [IU] | PEN_INJECTOR | SUBCUTANEOUS | 99 refills | Status: DC

## 2017-12-07 MED ORDER — INSULIN LISPRO 100 UNIT/ML (KWIKPEN): 5.0000 [IU] | PEN_INJECTOR | Freq: Three times a day (TID) | SUBCUTANEOUS | 1 refills | Status: DC

## 2017-12-07 NOTE — Progress Notes (Addendum)
Subjective:  By signing my name below, I, Alyssa Weaver, attest that this documentation has been prepared under the direction and in the presence of Delman Cheadle, MD Electronically Signed: Ladene Artist, ED Scribe 12/07/2017 at 5:25 PM.   Patient ID: Alyssa Weaver, female    DOB: 1951-03-14, 67 y.o.   MRN: [id number]  Chief Complaint  Patient presents with  . Diabetes    Pt states sugars have been running 115 before breakfast. Per pt's husband  her sugars drop down to 50s at night.  . Follow-up  . Medication Refill    Metformin 1000 MG sent [place], Rest of Rx sent to [facility]   HPI Alyssa Weaver is a 67 y.o. female who presents to Primary Care at [facility] for f/u on DM. [name] has a significant language barrier but her husband, who speaks English well, is here to translate and help provider hx.   IDDMII Pt is currently taking 30 units of insulin. Morning fasting blood glucose very labial but ranging 110s-140s. She reports night glucose readings ranging from 50s-90s around 1-2 AM. Pt suspects that her readings are fluctuating due to her eating sticky rice. She takes Metformin bid and does not check her blood glucose during the day. Pt has been on insulin for 5-6 yrs. She is afraid to increase injections due to fear of her glucose spiking.  Past Medical History:  Diagnosis Date  . Diabetes mellitus without complication ([place])   . Hyperlipidemia    Current Outpatient Medications on File Prior to Visit  Medication Sig Dispense Refill  . aspirin EC 81 MG tablet Take 1 tablet (81 mg total) by mouth daily. With a meal    . atorvastatin (LIPITOR) 80 MG tablet Take 1 tablet (80 mg total) by mouth daily. 90 tablet 3  . Blood Glucose Monitoring Suppl (AGAMATRIX PRESTO) w/Device KIT Twice daily sugar checks before meals 1 kit 0  . Calcium Citrate-Vitamin D (CITRACAL/VITAMIN D) 250-200 MG-UNIT TABS 2 tabs by mouth twice daily 120 each   . cetirizine (ZYRTEC) 10 MG tablet Take 1  tablet (10 mg total) by mouth daily. 30 tablet 11  . esomeprazole (NEXIUM) 40 MG capsule 1 capsule by mouth on an empty stomach 90 capsule 3  . glucose blood (AGAMATRIX PRESTO TEST) test strip Twice daily sugar checks before meals 300 each 3  . ibuprofen (ADVIL,MOTRIN) 200 MG tablet Take 200 mg by mouth every 6 (six) hours as needed.    . Insulin Glargine (LANTUS SOLOSTAR) 100 UNIT/ML Solostar Pen 35 units injected subcutaneously once daily 5 pen PRN  . meloxicam (MOBIC) 15 MG tablet Take 1 tablet (15 mg total) by mouth daily. 30 tablet 3  . metFORMIN (GLUCOPHAGE) 1000 MG tablet Take 1 tablet (1,000 mg total) by mouth 2 (two) times daily with a meal. 60 tablet 5  . mometasone (NASONEX) 50 MCG/ACT nasal spray 2 sprays each nostril daily 17 g 12  . polyethylene glycol powder (GLYCOLAX/MIRALAX) powder 17 g in 8 ounces fluid once daily 578 g 11  . Omega-3 Fatty Acids (FISH OIL) 1000 MG CAPS 1 cap by mouth twice daily (Patient not taking: Reported on 12/07/2017)  0   No current facility-administered medications on file prior to visit.     Past Surgical History:  Procedure Laterality Date  . TUBAL LIGATION  1985   No Known Allergies Family History  Problem Relation Age of Onset  . Asthma Brother    Social History   Socioeconomic History  .  Marital status: Married    Spouse name: [name]  . Number of children: 5  . Years of education: 69  . Highest education level: Not on file  Occupational History  . Occupation: housewife  Social Needs  . Financial resource strain: Not on file  . Food insecurity:    Worry: Not on file    Inability: Not on file  . Transportation needs:    Medical: Not on file    Non-medical: Not on file  Tobacco Use  . Smoking status: Current Every Day Smoker    Packs/day: 0.75    Years: 43.00    Pack years: 32.25    Types: Cigarettes  . Smokeless tobacco: Never Used  . Tobacco comment: Husband smokes 1 ppd.  He might be willing to quit at same time.  Substance  and Sexual Activity  . Alcohol use: No  . Drug use: No  . Sexual activity: Yes    Birth control/protection: Post-menopausal  Lifestyle  . Physical activity:    Days per week: Not on file    Minutes per session: Not on file  . Stress: Not on file  Relationships  . Social connections:    Talks on phone: Not on file    Gets together: Not on file    Attends religious service: Not on file    Active member of club or organization: Not on file    Attends meetings of clubs or organizations: Not on file    Relationship status: Not on file  Other Topics Concern  . Not on file  Social History Narrative   Originally from [place]   Refugees--lived in [place] 2 years.   Came to the [place].   Lived in the [place] until moving to [facility] Nov. 2016   Depression screen [facility] 2/9 12/07/2017 10/08/2017 06/06/2016 04/17/2016 01/18/2016  Decreased Interest 0 0 0 0 0  Down, Depressed, Hopeless 0 0 0 0 0  PHQ - 2 Score 0 0 0 0 0  Altered sleeping - - 0 - -  Tired, decreased energy - - 1 - -  Change in appetite - - 0 - -  Feeling bad or failure about yourself  - - 0 - -  Trouble concentrating - - 0 - -  Moving slowly or fidgety/restless - - 0 - -  Suicidal thoughts - - 0 - -  PHQ-9 Score - - 1 - -     Review of Systems  Constitutional: Negative for activity change, appetite change, chills and diaphoresis.  Respiratory: Negative for shortness of breath.   Cardiovascular: Negative for chest pain and leg swelling.  Allergic/Immunologic: Positive for environmental allergies.  Neurological: Negative for dizziness, weakness and light-headedness.  Hematological: Negative for adenopathy. Does not bruise/bleed easily.  Psychiatric/Behavioral: Negative for decreased concentration and dysphoric mood. The patient is not nervous/anxious.       Objective:   Physical Exam  Constitutional: She is oriented to person, place, and time. She appears well-developed and well-nourished. No distress.  HENT:    Head: Normocephalic and atraumatic.  Eyes: Conjunctivae and EOM are normal.  Neck: Neck supple. No tracheal deviation present.  Cardiovascular: Normal rate.  Pulmonary/Chest: Effort normal. No respiratory distress.  Musculoskeletal: Normal range of motion.  Neurological: She is alert and oriented to person, place, and time.  Skin: Skin is warm and dry.  Psychiatric: She has a normal mood and affect. Her behavior is normal.  Nursing note and vitals reviewed.  BP 120/70 (BP Location:  Left Arm, Patient Position: Sitting, Cuff Size: Normal)   Pulse 75   Temp 98 F (36.7 C) (Oral)   Resp 18   Ht '5\' 3"'  (1.6 m)   Wt 144 lb 6.4 oz (65.5 kg)   SpO2 98%   BMI 25.58 kg/m     Results for orders placed or performed in visit on 12/07/17  POCT glucose (manual entry)  Result Value Ref Range   POC Glucose 305 (A) 70 - 99 mg/dl   Assessment & Plan:   1. Type 2 diabetes mellitus without complication, with long-term current use of insulin (HCC) - decrease lantus from 30u (was instructed to increase to 35u but never did due to concerns about o/n  Hypoglycemia episdoes) to 20u and start mealtime Humalog 5u tid ac. Recheck in 1 mo Cont asa  2. Mixed hyperlipidemia - on lipitor 80 and fish oil - not fasting today  3. Tobacco use disorder - referred for smoking cessation appt w/ Ms. Eric Form and screening lung CT  4. Abnormal TSH - today tsh improving from prior 0.135 -> 0.367 today with nml FT3 and FT4.  5. Seasonal allergic rhinitis due to pollen - cont zyrtec 10 and nasonex 50 qd  6. Gastritis without bleeding, unspecified chronicity, unspecified gastritis type - cont nexium 40    Orders Placed This Encounter  Procedures  . Pneumococcal conjugate vaccine 13-valent IM  . Comprehensive metabolic panel  . TSH+T4F+T3Free  . Ambulatory Referral for Lung Cancer Scre    Referral Priority:   Routine    Referral Type:   Consultation    Referral Reason:   Specialty Services Required    Number of  Visits Requested:   1  . POCT glucose (manual entry)    Meds ordered this encounter  Medications  . insulin lispro (HUMALOG KWIKPEN) 100 UNIT/ML KiwkPen    Sig: Inject 0.05 mLs (5 Units total) into the skin 3 (three) times daily before meals. If cbg >150    Dispense:  15 mL    Refill:  1  . cetirizine (ZYRTEC) 10 MG tablet    Sig: Take 1 tablet (10 mg total) by mouth daily.    Dispense:  30 tablet    Refill:  11  . Insulin Glargine (LANTUS SOLOSTAR) 100 UNIT/ML Solostar Pen    Sig: Inject 20 Units into the skin every morning.    Dispense:  5 pen    Refill:  PRN    Cancel previous prescription    I personally performed the services described in this documentation, which was scribed in my presence. The recorded information has been reviewed and considered, and addended by me as needed.   Delman Cheadle, M.D.  Primary Care at [facility] [address] [place],  [address] [phone] phone [phone] fax  01/07/18 5:01 AM

## 2017-12-07 NOTE — Patient Instructions (Addendum)
   IF you received an x-ray today, you will receive an invoice from [facility]. Please contact [facility] at [phone] with questions or concerns regarding your invoice.   IF you received labwork today, you will receive an invoice from [facility]. Please contact [facility] at [phone] with questions or concerns regarding your invoice.   Our billing staff will not be able to assist you with questions regarding bills from these companies.  You will be contacted with the lab results as soon as they are available. The fastest way to get your results is to activate your My Chart account. Instructions are located on the last page of this paperwork. If you have not heard from us regarding the results in 2 weeks, please contact this office.     Blood Glucose Monitoring, Adult Monitoring your blood sugar (glucose) helps you manage your diabetes. It also helps you and your health care provider determine how well your diabetes management plan is working. Blood glucose monitoring involves checking your blood glucose as often as directed, and keeping a record (log) of your results over time. Why should I monitor my blood glucose? Checking your blood glucose regularly can:  Help you understand how food, exercise, illnesses, and medicines affect your blood glucose.  Let you know what your blood glucose is at any time. You can quickly tell if you are having low blood glucose (hypoglycemia) or high blood glucose (hyperglycemia).  Help you and your health care provider adjust your medicines as needed.  When should I check my blood glucose? Follow instructions from your health care provider about how often to check your blood glucose. This may depend on:  The type of diabetes you have.  How well-controlled your diabetes is.  Medicines you are taking.  If you have type 1 diabetes:  Check your blood glucose at least 2 times a day.  Also check your blood glucose: ? Before  every insulin injection. ? Before and after exercise. ? Between meals. ? 2 hours after a meal. ? Occasionally between 2:00 a.m. and 3:00 a.m., as directed. ? Before potentially dangerous tasks, like driving or using heavy machinery. ? At bedtime.  You may need to check your blood glucose more often, up to 6-10 times a day: ? If you use an insulin pump. ? If you need multiple daily injections (MDI). ? If your diabetes is not well-controlled. ? If you are ill. ? If you have a history of severe hypoglycemia. ? If you have a history of not knowing when your blood glucose is getting low (hypoglycemia unawareness). If you have type 2 diabetes:  If you take insulin or other diabetes medicines, check your blood glucose at least 2 times a day.  If you are on intensive insulin therapy, check your blood glucose at least 4 times a day. Occasionally, you may also need to check between 2:00 a.m. and 3:00 a.m., as directed.  Also check your blood glucose: ? Before and after exercise. ? Before potentially dangerous tasks, like driving or using heavy machinery.  You may need to check your blood glucose more often if: ? Your medicine is being adjusted. ? Your diabetes is not well-controlled. ? You are ill. What is a blood glucose log?  A blood glucose log is a record of your blood glucose readings. It helps you and your health care provider: ? Look for patterns in your blood glucose over time. ? Adjust your diabetes management plan as needed.  Every time you check   your blood glucose, write down your result and notes about things that may be affecting your blood glucose, such as your diet and exercise for the day.  Most glucose meters store a record of glucose readings in the meter. Some meters allow you to download your records to a computer. How do I check my blood glucose? Follow these steps to get accurate readings of your blood glucose: Supplies needed   Blood glucose meter.  Test  strips for your meter. Each meter has its own strips. You must use the strips that come with your meter.  A needle to prick your finger (lancet). Do not use lancets more than once.  A device that holds the lancet (lancing device).  A journal or log book to write down your results. Procedure  Wash your hands with soap and water.  Prick the side of your finger (not the tip) with the lancet. Use a different finger each time.  Gently rub the finger until a small drop of blood appears.  Follow instructions that come with your meter for inserting the test strip, applying blood to the strip, and using your blood glucose meter.  Write down your result and any notes. Alternative testing sites  Some meters allow you to use areas of your body other than your finger (alternative sites) to test your blood.  If you think you may have hypoglycemia, or if you have hypoglycemia unawareness, do not use alternative sites. Use your finger instead.  Alternative sites may not be as accurate as the fingers, because blood flow is slower in these areas. This means that the result you get may be delayed, and it may be different from the result that you would get from your finger.  The most common alternative sites are: ? Forearm. ? Thigh. ? Palm of the hand. Additional tips  Always keep your supplies with you.  If you have questions or need help, all blood glucose meters have a 24-hour "hotline" number that you can call. You may also contact your health care provider.  After you use a few boxes of test strips, adjust (calibrate) your blood glucose meter by following instructions that came with your meter. This information is not intended to replace advice given to you by your health care provider. Make sure you discuss any questions you have with your health care provider. Document Released: 07/20/2003 Document Revised: 02/04/2016 Document Reviewed: 12/27/2015 Elsevier Interactive Patient Education   2017 Elsevier Inc.  

## 2017-12-08 LAB — TSH+T4F+T3FREE
Free T4: 1.18 ng/dL (ref 0.82–1.77)
T3, Free: 3.2 pg/mL (ref 2.0–4.4)
TSH: 0.367 u[IU]/mL — AB (ref 0.450–4.500)

## 2017-12-08 LAB — COMPREHENSIVE METABOLIC PANEL
A/G RATIO: 1.7 (ref 1.2–2.2)
ALBUMIN: 4.5 g/dL (ref 3.6–4.8)
ALK PHOS: 78 IU/L (ref 39–117)
ALT: 35 IU/L — AB (ref 0–32)
AST: 31 IU/L (ref 0–40)
BUN / CREAT RATIO: 20 (ref 12–28)
BUN: 14 mg/dL (ref 8–27)
Bilirubin Total: 0.2 mg/dL (ref 0.0–1.2)
CHLORIDE: 102 mmol/L (ref 96–106)
CO2: 23 mmol/L (ref 20–29)
Calcium: 9.1 mg/dL (ref 8.7–10.3)
Creatinine, Ser: 0.71 mg/dL (ref 0.57–1.00)
GFR calc non Af Amer: 90 mL/min/{1.73_m2} (ref >59)
GFR, EST AFRICAN AMERICAN: 103 mL/min/{1.73_m2} (ref >59)
GLUCOSE: 285 mg/dL — AB (ref 65–99)
Globulin, Total: 2.6 g/dL (ref 1.5–4.5)
Potassium: 4.1 mmol/L (ref 3.5–5.2)
Sodium: 137 mmol/L (ref 134–144)
TOTAL PROTEIN: 7.1 g/dL (ref 6.0–8.5)

## 2017-12-11 ENCOUNTER — Other Ambulatory Visit: Payer: Self-pay | Admitting: Acute Care

## 2017-12-11 DIAGNOSIS — Z122 Encounter for screening for malignant neoplasm of respiratory organs: Secondary | ICD-10-CM

## 2017-12-11 DIAGNOSIS — F17210 Nicotine dependence, cigarettes, uncomplicated: Secondary | ICD-10-CM

## 2017-12-14 ENCOUNTER — Encounter: Payer: Self-pay | Admitting: Internal Medicine

## 2017-12-19 ENCOUNTER — Ambulatory Visit (INDEPENDENT_AMBULATORY_CARE_PROVIDER_SITE_OTHER): Payer: Medicare Other | Admitting: Acute Care

## 2017-12-19 ENCOUNTER — Encounter: Payer: Self-pay | Admitting: Acute Care

## 2017-12-19 ENCOUNTER — Ambulatory Visit (INDEPENDENT_AMBULATORY_CARE_PROVIDER_SITE_OTHER)
Admission: RE | Admit: 2017-12-19 | Discharge: 2017-12-19 | Disposition: A | Discharge status: Home / Self Care | Payer: Medicare Other | Source: Ambulatory Visit | Attending: Acute Care | Admitting: Acute Care

## 2017-12-19 DIAGNOSIS — F17210 Nicotine dependence, cigarettes, uncomplicated: Secondary | ICD-10-CM

## 2017-12-19 DIAGNOSIS — Z122 Encounter for screening for malignant neoplasm of respiratory organs: Secondary | ICD-10-CM

## 2017-12-19 NOTE — Progress Notes (Signed)
Shared Decision Making Visit Lung Cancer Screening Program [phone])   Eligibility:  Age 67 y.o.  Pack Years Smoking History Calculation 32 pack years (# packs/per year x # years smoked)  Recent History of coughing up blood  no  Unexplained weight loss? no ( >Than 15 pounds within the last 6 months )  Prior History Lung / other cancer no (Diagnosis within the last 5 years already requiring surveillance chest CT Scans).  Smoking Status Current Smoker  Former Smokers: Years since quit: NA  Quit Date: NA  Visit Components:  Discussion included one or more decision making aids. yes  Discussion included risk/benefits of screening. yes  Discussion included potential follow up diagnostic testing for abnormal scans. yes  Discussion included meaning and risk of over diagnosis. yes  Discussion included meaning and risk of False Positives. yes  Discussion included meaning of total radiation exposure. yes  Counseling Included:  Importance of adherence to annual lung cancer LDCT screening. yes  Impact of comorbidities on ability to participate in the program. yes  Ability and willingness to under diagnostic treatment. yes  Smoking Cessation Counseling:  Current Smokers:   Discussed importance of smoking cessation. yes  Information about tobacco cessation classes and interventions provided to patient. yes  Patient provided with "ticket" for LDCT Scan. yes  Symptomatic Patient. no  Counseling  Diagnosis Code: Tobacco Use Z72.0  Asymptomatic Patient yes  Counseling (Intermediate counseling: > three minutes counseling) J8841  Former Smokers:   Discussed the importance of maintaining cigarette abstinence. yes  Diagnosis Code: Personal History of Nicotine Dependence. Y60.630  Information about tobacco cessation classes and interventions provided to patient. Yes  Patient provided with "ticket" for LDCT Scan. yes  Written Order for Lung Cancer Screening with LDCT  placed in Epic. Yes (CT Chest Lung Cancer Screening Low Dose W/O CM) [id number] Z12.2-Screening of respiratory organs Z87.891-Personal history of nicotine dependence  I have spent 25 minutes of face to face time with Ms. Chasen and her husband ( She has a language barrier, her husband speaks good [place]) discussing the risks and benefits of lung cancer screening. We viewed a power point together that explained in detail the above noted topics. We paused at intervals to allow for questions to be asked and answered to ensure understanding.We discussed that the single most powerful action that she  can take to decrease her risk of developing lung cancer is to quit smoking. We discussed whether or not she is ready to commit to setting a quit date. We discussed options for tools to aid in quitting smoking including nicotine replacement therapy, non-nicotine medications, support groups, Quit Smart classes, and behavior modification. We discussed that often times setting smaller, more achievable goals, such as eliminating 1 cigarette a day for a week and then 2 cigarettes a day for a week can be helpful in slowly decreasing the number of cigarettes smoked. This allows for a sense of accomplishment as well as providing a clinical benefit. I gave her the " Be Stronger Than Your Excuses" card with contact information for community resources, classes, free nicotine replacement therapy, and access to mobile apps, text messaging, and on-line smoking cessation help. I have also given her my card and contact information in the event she needs to contact me. We discussed the time and location of the scan, and that either Doroteo Glassman RN or I will call with the results within 24-48 hours of receiving them. I have offered her  a copy of the power point  we viewed  as a resource in the event they need reinforcement of the concepts we discussed today in the office. The patient verbalized understanding of all of  the above and had  no further questions upon leaving the office. They have my contact information in the event they have any further questions.  I spent 4 minutes counseling on smoking cessation and the health risks of continued tobacco abuse.  I explained to the patient that there has been a high incidence of coronary artery disease noted on these exams. I explained that this is a non-gated exam therefore degree or severity cannot be determined. This patient is on statin therapy. I have asked the patient to follow-up with their PCP regarding any incidental finding of coronary artery disease and management with diet or medication as their PCP  feels is clinically indicated. The patient verbalized understanding of the above and had no further questions upon completion of the visit.      Magdalen Spatz, NP 12/19/2017 10:01 AM

## 2017-12-21 ENCOUNTER — Ambulatory Visit: Payer: Medicare Other | Admitting: Internal Medicine

## 2017-12-27 ENCOUNTER — Other Ambulatory Visit: Payer: Self-pay | Admitting: Acute Care

## 2017-12-27 DIAGNOSIS — Z122 Encounter for screening for malignant neoplasm of respiratory organs: Secondary | ICD-10-CM

## 2017-12-27 DIAGNOSIS — F17210 Nicotine dependence, cigarettes, uncomplicated: Secondary | ICD-10-CM

## 2018-01-07 ENCOUNTER — Ambulatory Visit (INDEPENDENT_AMBULATORY_CARE_PROVIDER_SITE_OTHER): Payer: Medicare Other | Admitting: Family Medicine

## 2018-01-07 ENCOUNTER — Other Ambulatory Visit: Payer: Self-pay

## 2018-01-07 ENCOUNTER — Encounter: Payer: Self-pay | Admitting: Family Medicine

## 2018-01-07 VITALS — BP 111/65 | HR 84 | Temp 98.4°F | Resp 16 | Ht 62.48 in | Wt 143.4 lb

## 2018-01-07 DIAGNOSIS — Z780 Asymptomatic menopausal state: Secondary | ICD-10-CM | POA: Diagnosis not present

## 2018-01-07 DIAGNOSIS — Z1159 Encounter for screening for other viral diseases: Secondary | ICD-10-CM

## 2018-01-07 DIAGNOSIS — Z114 Encounter for screening for human immunodeficiency virus [HIV]: Secondary | ICD-10-CM | POA: Diagnosis not present

## 2018-01-07 DIAGNOSIS — J301 Allergic rhinitis due to pollen: Secondary | ICD-10-CM | POA: Insufficient documentation

## 2018-01-07 DIAGNOSIS — E782 Mixed hyperlipidemia: Secondary | ICD-10-CM

## 2018-01-07 DIAGNOSIS — Z794 Long term (current) use of insulin: Secondary | ICD-10-CM

## 2018-01-07 DIAGNOSIS — F17200 Nicotine dependence, unspecified, uncomplicated: Secondary | ICD-10-CM | POA: Insufficient documentation

## 2018-01-07 DIAGNOSIS — Z113 Encounter for screening for infections with a predominantly sexual mode of transmission: Secondary | ICD-10-CM

## 2018-01-07 DIAGNOSIS — E119 Type 2 diabetes mellitus without complications: Secondary | ICD-10-CM | POA: Diagnosis not present

## 2018-01-07 DIAGNOSIS — Z1211 Encounter for screening for malignant neoplasm of colon: Secondary | ICD-10-CM | POA: Diagnosis not present

## 2018-01-07 DIAGNOSIS — R7989 Other specified abnormal findings of blood chemistry: Secondary | ICD-10-CM | POA: Insufficient documentation

## 2018-01-07 DIAGNOSIS — E1165 Type 2 diabetes mellitus with hyperglycemia: Secondary | ICD-10-CM

## 2018-01-07 DIAGNOSIS — Z1212 Encounter for screening for malignant neoplasm of rectum: Secondary | ICD-10-CM

## 2018-01-07 DIAGNOSIS — E041 Nontoxic single thyroid nodule: Secondary | ICD-10-CM

## 2018-01-07 MED ORDER — FREESTYLE LIBRE 14 DAY READER DEVI: 1.0000 [IU] | Freq: Every day | 0 refills | Status: DC

## 2018-01-07 MED ORDER — ZOSTER VAC RECOMB ADJUVANTED 50 MCG/0.5ML IM SUSR: 0.5000 mL | Freq: Once | INTRAMUSCULAR | 1 refills | Status: AC

## 2018-01-07 MED ORDER — FREESTYLE LIBRE 14 DAY SENSOR MISC: 1.0000 [IU] | 11 refills | Status: DC

## 2018-01-07 NOTE — Progress Notes (Signed)
Subjective:  By signing my name below, I, [name], attest that this documentation has been prepared under the direction and in the presence of Delman Cheadle, MD Electronically Signed: Ladene Artist, ED Scribe 01/07/2018 at 3:29 PM.   Patient ID: Alyssa Weaver, female    DOB: 03/18/1951, 67 y.o.   MRN: [id number]  Chief Complaint  Patient presents with  . Follow-up    3 week f/u    HPI Alyssa Weaver is a 67 y.o. female who presents to Primary Care at [facility] for f/u since we started prn meal time Humalog for elevated cbgs at last visit 1 mo prev. Pt brought in home blood glucose readings x 3 wks. Breakfast readings ranging from 94-175, largely 110s-130s. Lunch with 1 reading of 53 and reports feeling fatigue at that time, typically ranging 70s-170, largely around 90s. Dinner ranging 80s-180s, largely 90s-140. States she has sweet rice when her blood glucose is elevated. Denies any other low readings. She feels comfortable at current 20 unit dose of Lanus. Pt takes 5 units Humalog before meal if cbg is over 150. States her largest meal depends on the day (sometimes it's breakfast, lunch or dinner).   Past Medical History:  Diagnosis Date  . Diabetes mellitus without complication ([place])   . Hyperlipidemia    Current Outpatient Medications on File Prior to Visit  Medication Sig Dispense Refill  . aspirin EC 81 MG tablet Take 1 tablet (81 mg total) by mouth daily. With a meal    . atorvastatin (LIPITOR) 80 MG tablet Take 1 tablet (80 mg total) by mouth daily. 90 tablet 3  . Blood Glucose Monitoring Suppl (AGAMATRIX PRESTO) w/Device KIT Twice daily sugar checks before meals 1 kit 0  . Calcium Citrate-Vitamin D (CITRACAL/VITAMIN D) 250-200 MG-UNIT TABS 2 tabs by mouth twice daily 120 each   . cetirizine (ZYRTEC) 10 MG tablet Take 1 tablet (10 mg total) by mouth daily. 30 tablet 11  . esomeprazole (NEXIUM) 40 MG capsule 1 capsule by mouth on an empty stomach 90 capsule 3  . glucose  blood (AGAMATRIX PRESTO TEST) test strip Twice daily sugar checks before meals 300 each 3  . ibuprofen (ADVIL,MOTRIN) 200 MG tablet Take 200 mg by mouth every 6 (six) hours as needed.    . Insulin Glargine (LANTUS SOLOSTAR) 100 UNIT/ML Solostar Pen Inject 20 Units into the skin every morning. 5 pen PRN  . insulin lispro (HUMALOG KWIKPEN) 100 UNIT/ML KiwkPen Inject 0.05 mLs (5 Units total) into the skin 3 (three) times daily before meals. If cbg >150 15 mL 1  . meloxicam (MOBIC) 15 MG tablet Take 1 tablet (15 mg total) by mouth daily. 30 tablet 3  . metFORMIN (GLUCOPHAGE) 1000 MG tablet Take 1 tablet (1,000 mg total) by mouth 2 (two) times daily with a meal. 60 tablet 5  . mometasone (NASONEX) 50 MCG/ACT nasal spray 2 sprays each nostril daily 17 g 12  . Omega-3 Fatty Acids (FISH OIL) 1000 MG CAPS 1 cap by mouth twice daily (Patient not taking: Reported on 01/07/2018)  0  . polyethylene glycol powder (GLYCOLAX/MIRALAX) powder 17 g in 8 ounces fluid once daily (Patient not taking: Reported on 01/07/2018) 578 g 11   No current facility-administered medications on file prior to visit.    Past Surgical History:  Procedure Laterality Date  . TUBAL LIGATION  1985   No Known Allergies Family History  Problem Relation Age of Onset  . Asthma Brother    Social History  Socioeconomic History  . Marital status: Married    Spouse name: [name]  . Number of children: 5  . Years of education: 6  . Highest education level: Not on file  Occupational History  . Occupation: housewife  Social Needs  . Financial resource strain: Not on file  . Food insecurity:    Worry: Not on file    Inability: Not on file  . Transportation needs:    Medical: Not on file    Non-medical: Not on file  Tobacco Use  . Smoking status: Current Every Day Smoker    Packs/day: 0.75    Years: 43.00    Pack years: 32.25    Types: Cigarettes  . Smokeless tobacco: Never Used  . Tobacco comment: Husband smokes 1 ppd.  He  might be willing to quit at same time.  Substance and Sexual Activity  . Alcohol use: No  . Drug use: No  . Sexual activity: Yes    Birth control/protection: Post-menopausal  Lifestyle  . Physical activity:    Days per week: Not on file    Minutes per session: Not on file  . Stress: Not on file  Relationships  . Social connections:    Talks on phone: Not on file    Gets together: Not on file    Attends religious service: Not on file    Active member of club or organization: Not on file    Attends meetings of clubs or organizations: Not on file    Relationship status: Not on file  Other Topics Concern  . Not on file  Social History Narrative   Originally from [place]   Refugees--lived in [place] 2 years.   Came to the [place].   Lived in the [place] until moving to [facility] Nov. 2016   Depression screen [facility] 2/9 01/07/2018 12/07/2017 10/08/2017 06/06/2016 04/17/2016  Decreased Interest 0 0 0 0 0  Down, Depressed, Hopeless 0 0 0 0 0  PHQ - 2 Score 0 0 0 0 0  Altered sleeping - - - 0 -  Tired, decreased energy - - - 1 -  Change in appetite - - - 0 -  Feeling bad or failure about yourself  - - - 0 -  Trouble concentrating - - - 0 -  Moving slowly or fidgety/restless - - - 0 -  Suicidal thoughts - - - 0 -  PHQ-9 Score - - - 1 -     Review of Systems    Objective:   Physical Exam  Constitutional: She is oriented to person, place, and time. She appears well-developed and well-nourished. No distress.  HENT:  Head: Normocephalic and atraumatic.  Eyes: Conjunctivae and EOM are normal.  Neck: Neck supple. No tracheal deviation present.  Cardiovascular: Normal rate.  Pulmonary/Chest: Effort normal. No respiratory distress.  Musculoskeletal: Normal range of motion.  Neurological: She is alert and oriented to person, place, and time.  Skin: Skin is warm and dry.  Psychiatric: She has a normal mood and affect. Her behavior is normal.  Nursing note and vitals reviewed.  BP  111/65   Pulse 84   Temp 98.4 F (36.9 C)   Resp 16   Ht 5' 2.48" (1.587 m)   Wt 143 lb 6.4 oz (65 kg)   SpO2 98%   BMI 25.83 kg/m    Assessment & Plan:   1. Type 2 diabetes mellitus without complication, with long-term current use of insulin ([place]) - doing much better on  current regimen since we decreased lantus back from 35 to 20u, pt very reluctant to add in Humalog 5u tidac without checking cbgs prior to meals so will continue to have her use Humalog 5u before meal if cbg >150 but if cbg >170 before dinner (largest meal), then take 10u Humalog.  Cont metformin 1g bid.  Refer for eye exam nml microalb 08/24/17  2. Mixed hyperlipidemia - lipids at goal with LDL 55 and non-HDL 82 <2 mos prior 11/22/2017 on atorvastatin 80.  3. 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 mild centrilobular emphysema w/ mild diffuse bronchial wall thickening- a few scattered tiny nodules so repeat in 12 mos - NEEDS EKG AND PFTs at next OV.  4. Abnormal TSH - consistently low TSH with nml FT3/FT4- after [place] consider endo vs radioiodine NM scan  5. Left thyroid nodule - on recent chest CT scan saw 1.4 cm anterior Left lobe thyroid nodule - needs [place] for further characterization - may need to consider endocrine eval and/or radioiodine uptake scan of nodule to see if its "hot"  6. Screening for colorectal cancer   7. Routine screening for STI (sexually transmitted infection)   8. Screening for HIV (human immunodeficiency virus)   9. Need for hepatitis C screening test   10. Postmenopausal estrogen deficiency - needs dexa bone scan    Orders Placed This Encounter  Procedures  . US THYROID    Standing Status:   Future    Standing Expiration Date:   03/10/2019    Order Specific Question:   Reason for Exam (SYMPTOM  OR DIAGNOSIS REQUIRED)    Answer:   1.4 cm  partically calcificed nodle on anterior left thryoid lobe seen on chest CT 11/2017, TSH suppressed    Order Specific Question:   Preferred imaging location?    Answer:   [facility]  . DG Bone Density    Standing Status:   Future    Standing Expiration Date:   03/10/2019    Order Specific Question:   Reason for Exam (SYMPTOM  OR DIAGNOSIS REQUIRED)    Answer:   post-menopausal estrogen deficiency    Order Specific Question:   Preferred imaging location?    Answer:   [facility]  . TSH+T4F+T3Free  . HIV antibody  . HCV Ab w/Rflx to Verification  . Fructosamine  . Interpretation:  . Ambulatory referral to Ophthalmology    Referral Priority:   Routine    Referral Type:   Consultation    Referral Reason:   Specialty Services Required    Requested Specialty:   Ophthalmology    Number of Visits Requested:   1  . Ambulatory referral to Gastroenterology    Referral Priority:   Routine    Referral Type:   Consultation    Referral Reason:   Specialty Services Required    Number of Visits Requested:   1  . POCT glucose (manual entry)    Meds ordered this encounter  Medications  . Zoster Vaccine Adjuvanted [facility]) injection    Sig: Inject 0.5 mLs into the muscle once for 1 dose. Repeat once 2-6 months after initial dose    Dispense:  0.5 mL    Refill:  1  . Continuous Blood Gluc Receiver (FREESTYLE LIBRE 14 DAY READER) DEVI    Sig: 1 Units by  Does not apply route 6 (six) times daily.    Dispense:  1 Device    Refill:  0  . Continuous Blood Gluc Sensor (FREESTYLE LIBRE 14 DAY SENSOR) MISC    Sig: 1 Units by Does not apply route every 14 (fourteen) days.    Dispense:  2 each    Refill:  11    I personally performed the services described in this documentation, which was scribed in my presence. The recorded information has been reviewed and considered, and addended by me as needed.   Delman Cheadle, M.D.  Primary Care at [facility] [address] [place], [place] [address] [phone] phone [phone] fax  01/12/18 6:25 AM

## 2018-01-07 NOTE — Patient Instructions (Addendum)
YOU ARE DOING FANTASTIC - I AM SO PROUD OF YOU!!!!!  kEEP ON THE SAME REGIMEN - NO CHANGES.  TAKE 20U OF LEVEMIR IN THE A.M. AND THEN TAKE 5U OF HUMALOG IF YOUR CBG IS >150.  IF YOUR CBG IS >170 AT DINNER TIME, TAKE 10U OF HUMALOG.  YOU WILL GET A CALL FROM [facility] TO SCHEDULE AN ULTRASOUND TO EVALUATE A THYROID NODULE THAT WAS SEEN ON YOUR CT SCAN OF YOUR LUNGS.    IF you received an x-ray today, you will receive an invoice from [facility]. Please contact [facility] at [phone] with questions or concerns regarding your invoice.   IF you received labwork today, you will receive an invoice from [facility]. Please contact [facility] at [phone] with questions or concerns regarding your invoice.   Our billing staff will not be able to assist you with questions regarding bills from these companies.  You will be contacted with the lab results as soon as they are available. The fastest way to get your results is to activate your My Chart account. Instructions are located on the last page of this paperwork. If you have not heard from [place] regarding the results in 2 weeks, please contact this office.     Thyroid Nodule A thyroid nodule is an isolatedgrowth of thyroid cells that forms a lump in your thyroid gland. The thyroid gland is a butterfly-shaped gland. It is found in the lower front of your neck. This gland sends chemical messengers (hormones) through your blood to all parts of your body. These hormones are important in regulating your body temperature and helping your body to use energy. Thyroid nodules are common. Most are not cancerous (are benign). You may have one nodule or several nodules. Different types of thyroid nodules include:  Nodules that grow and fill with fluid (thyroid cysts).  Nodules that produce too much thyroid hormone (hot nodules or hyperthyroid).  Nodules that produce no thyroid hormone (cold nodules or  hypothyroid).  Nodules that form from cancer cells (thyroid cancers).  What are the causes? Usually, the cause of this condition is not known. What increases the risk? Factors that make this condition more likely to develop include:  Increasing age. Thyroid nodules become more common in people who are older than 66 years of age.  Gender. ? Benign thyroid nodules are more common in women. ? Cancerous (malignant) thyroid nodules are more common in men.  A family history that includes: ? Thyroid nodules. ? Pheochromocytoma. ? Thyroid carcinoma. ? Hyperparathyroidism.  Certain kinds of thyroid diseases, such as Hashimoto thyroiditis.  Lack of iodine.  A history of head and neck radiation, such as from X-rays.  What are the signs or symptoms? It is common for this condition to cause no symptoms. If you have symptoms, they may include:  A lump in your lower neck.  Feeling a lump or tickle in your throat.  Pain in your neck, jaw, or ear.  Having trouble swallowing.  Hot nodules may cause symptoms that include:  Weight loss.  Warm, flushed skin.  Feeling hot.  Feeling nervous.  A racing heartbeat.  Cold nodules may cause symptoms that include:  Weight gain.  Dry skin.  Brittle hair. This may also occur with hair loss.  Feeling cold.  Fatigue.  Thyroid cancer nodules may cause symptoms that include:  Hard nodules that feel stuck to the thyroid gland.  Hoarseness.  Lumps in the glands near your thyroid (lymph nodes).  How  is this diagnosed? A thyroid nodule may be felt by your health care provider during a physical exam. This condition may also be diagnosed based on your symptoms. You may also have tests, including:  An ultrasound. This may be done to confirm the diagnosis.  A biopsy. This involves taking a sample from the nodule and looking at it under a microscope to see if the nodule is benign.  Blood tests to make sure that your thyroid is  working properly.  Imaging tests such as MRI or CT scan may be done if: ? Your nodule is large. ? Your nodule is blocking your airway. ? Cancer is suspected.  How is this treated? Treatment depends on the cause and size of your nodule or nodules. If the nodule is benign, treatment may not be necessary. Your health care provider may monitor the nodule to see if it goes away without treatment. If the nodule continues to grow, is cancerous, or does not go away:  It may need to be drained with a needle.  It may need to be removed with surgery.  If you have surgery, part or all of your thyroid gland may need to be removed as well. Follow these instructions at home:  Pay attention to any changes in your nodule.  Take over-the-counter and prescription medicines only as told by your health care provider.  Keep all follow-up visits as told by your health care provider. This is important. Contact a health care provider if:  Your voice changes.  You have trouble swallowing.  You have pain in your neck, ear, or jaw that is getting worse.  Your nodule gets bigger.  Your nodule starts to make it harder for you to breathe. Get help right away if:  You have a sudden fever.  You feel very weak.  Your muscles look like they are shrinking (muscle wasting).  You have mood swings.  You feel very restless.  You feel confused.  You are seeing or hearing things that other people do not see or hear (having hallucinations).  You feel suddenly nauseous or throw up.  You suddenly have diarrhea.  You have chest pain.  There is a loss of consciousness. This information is not intended to replace advice given to you by your health care provider. Make sure you discuss any questions you have with your health care provider. Document Released: 06/09/2004 Document Revised: 03/19/2016 Document Reviewed: 10/28/2014 Elsevier Interactive Patient Education  2018 [employer].

## 2018-01-08 LAB — FRUCTOSAMINE: FRUCTOSAMINE: 297 umol/L — AB (ref 0–285)

## 2018-01-08 LAB — HIV ANTIBODY (ROUTINE TESTING W REFLEX): HIV SCREEN 4TH GENERATION: NONREACTIVE

## 2018-01-08 LAB — TSH+T4F+T3FREE
Free T4: 1.31 ng/dL (ref 0.82–1.77)
T3, Free: 2.5 pg/mL (ref 2.0–4.4)
TSH: 0.341 u[IU]/mL — ABNORMAL LOW (ref 0.450–4.500)

## 2018-01-08 LAB — HCV INTERPRETATION

## 2018-01-08 LAB — HCV AB W/RFLX TO VERIFICATION

## 2018-01-23 ENCOUNTER — Ambulatory Visit
Admission: RE | Admit: 2018-01-23 | Discharge: 2018-01-23 | Disposition: A | Discharge status: Home / Self Care | Payer: Medicare Other | Source: Ambulatory Visit | Attending: Family Medicine | Admitting: Family Medicine

## 2018-01-23 DIAGNOSIS — E041 Nontoxic single thyroid nodule: Secondary | ICD-10-CM | POA: Diagnosis not present

## 2018-01-23 DIAGNOSIS — R7989 Other specified abnormal findings of blood chemistry: Secondary | ICD-10-CM

## 2018-02-07 NOTE — Addendum Note (Signed)
Addended by: Shawnee Knapp on: 02/07/2018 10:35 AM   Modules accepted: Level of Service

## 2018-02-08 ENCOUNTER — Telehealth: Payer: Self-pay

## 2018-02-08 NOTE — Telephone Encounter (Signed)
Spoke with pt's daughter, she is no longer needing the freestyle 14 day reader. Her insurance would not pay for it. The daughter bought another monitor for this short term use.

## 2018-03-02 ENCOUNTER — Other Ambulatory Visit: Payer: Self-pay | Admitting: Internal Medicine

## 2018-03-04 ENCOUNTER — Other Ambulatory Visit: Payer: Self-pay

## 2018-03-04 ENCOUNTER — Other Ambulatory Visit: Payer: Self-pay | Admitting: Family Medicine

## 2018-03-04 MED ORDER — METFORMIN HCL 1000 MG PO TABS: 1000.0000 mg | ORAL_TABLET | Freq: Two times a day (BID) | ORAL | 0 refills | Status: DC

## 2018-03-04 NOTE — Telephone Encounter (Signed)
LOV  01/07/18 Dr. Brigitte Pulse A different provider is associated with this medication.

## 2018-03-04 NOTE — Telephone Encounter (Signed)
Copied from [place] [id number]. Topic: Quick Communication - See Telephone Encounter >> Mar 04, 2018 10:57 AM Loma Boston wrote: CRM for notification. See Telephone encounter for: 03/04/18. Needs metFORMIN (GLUCOPHAGE) 1000 MG tablet [[id number]] enough for to get to appointment 8/7 only has 2 left and takes 2 daily appt is on 8/7 with Dr Brigitte Pulse.  [place] [phone]  Order Details

## 2018-03-06 ENCOUNTER — Ambulatory Visit (INDEPENDENT_AMBULATORY_CARE_PROVIDER_SITE_OTHER): Payer: Medicare Other | Admitting: Family Medicine

## 2018-03-06 ENCOUNTER — Ambulatory Visit: Payer: Medicare Other

## 2018-03-06 DIAGNOSIS — Z794 Long term (current) use of insulin: Secondary | ICD-10-CM

## 2018-03-06 DIAGNOSIS — E782 Mixed hyperlipidemia: Secondary | ICD-10-CM

## 2018-03-06 DIAGNOSIS — E041 Nontoxic single thyroid nodule: Secondary | ICD-10-CM

## 2018-03-06 DIAGNOSIS — R7989 Other specified abnormal findings of blood chemistry: Secondary | ICD-10-CM | POA: Diagnosis not present

## 2018-03-06 DIAGNOSIS — R718 Other abnormality of red blood cells: Secondary | ICD-10-CM

## 2018-03-06 DIAGNOSIS — E1165 Type 2 diabetes mellitus with hyperglycemia: Secondary | ICD-10-CM | POA: Diagnosis not present

## 2018-03-06 MED ORDER — METFORMIN HCL 1000 MG PO TABS: 1000.0000 mg | ORAL_TABLET | Freq: Two times a day (BID) | ORAL | 1 refills | Status: DC

## 2018-03-06 NOTE — Progress Notes (Signed)
Lab only visit - pt not seen.  

## 2018-03-06 NOTE — Addendum Note (Signed)
Addended by: Shawnee Knapp on: 03/06/2018 03:59 PM   Modules accepted: Orders

## 2018-03-07 LAB — BASIC METABOLIC PANEL
BUN / CREAT RATIO: 15 (ref 12–28)
BUN: 13 mg/dL (ref 8–27)
CO2: 24 mmol/L (ref 20–29)
Calcium: 9.7 mg/dL (ref 8.7–10.3)
Chloride: 96 mmol/L (ref 96–106)
Creatinine, Ser: 0.87 mg/dL (ref 0.57–1.00)
GFR calc non Af Amer: 70 mL/min/{1.73_m2} (ref >59)
GFR, EST AFRICAN AMERICAN: 81 mL/min/{1.73_m2} (ref >59)
Glucose: 323 mg/dL — ABNORMAL HIGH (ref 65–99)
POTASSIUM: 4.4 mmol/L (ref 3.5–5.2)
Sodium: 136 mmol/L (ref 134–144)

## 2018-03-07 LAB — FERRITIN: Ferritin: 74 ng/mL (ref 15–150)

## 2018-03-07 LAB — CBC WITH DIFFERENTIAL/PLATELET
BASOS ABS: 0 10*3/uL (ref 0.0–0.2)
Basos: 0 %
EOS (ABSOLUTE): 0.3 10*3/uL (ref 0.0–0.4)
EOS: 5 %
HEMATOCRIT: 40.8 % (ref 34.0–46.6)
HEMOGLOBIN: 12.6 g/dL (ref 11.1–15.9)
IMMATURE GRANS (ABS): 0 10*3/uL (ref 0.0–0.1)
IMMATURE GRANULOCYTES: 0 %
LYMPHS ABS: 2 10*3/uL (ref 0.7–3.1)
Lymphs: 32 %
MCH: 25.9 pg — ABNORMAL LOW (ref 26.6–33.0)
MCHC: 30.9 g/dL — ABNORMAL LOW (ref 31.5–35.7)
MCV: 84 fL (ref 79–97)
MONOCYTES: 6 %
Monocytes Absolute: 0.4 10*3/uL (ref 0.1–0.9)
NEUTROS PCT: 57 %
Neutrophils Absolute: 3.5 10*3/uL (ref 1.4–7.0)
Platelets: 211 10*3/uL (ref 150–450)
RBC: 4.86 x10E6/uL (ref 3.77–5.28)
RDW: 13.9 % (ref 12.3–15.4)
WBC: 6.2 10*3/uL (ref 3.4–10.8)

## 2018-03-07 LAB — TSH+T4F+T3FREE
Free T4: 1.51 ng/dL (ref 0.82–1.77)
T3 FREE: 2.6 pg/mL (ref 2.0–4.4)
TSH: 0.391 u[IU]/mL — ABNORMAL LOW (ref 0.450–4.500)

## 2018-03-07 LAB — HEMOGLOBIN A1C
Est. average glucose Bld gHb Est-mCnc: 200 mg/dL
Hgb A1c MFr Bld: 8.6 % — ABNORMAL HIGH (ref 4.8–5.6)

## 2018-03-11 ENCOUNTER — Telehealth: Payer: Self-pay | Admitting: Family Medicine

## 2018-03-11 ENCOUNTER — Ambulatory Visit: Payer: Medicare Other | Admitting: Family Medicine

## 2018-03-11 NOTE — Telephone Encounter (Signed)
Called pt. To reschedule appt on 06/06/18. Left VM

## 2018-03-29 ENCOUNTER — Encounter: Payer: Self-pay | Admitting: Family Medicine

## 2018-04-17 ENCOUNTER — Other Ambulatory Visit: Payer: Self-pay | Admitting: Internal Medicine

## 2018-05-06 ENCOUNTER — Telehealth: Payer: Self-pay | Admitting: Internal Medicine

## 2018-05-06 NOTE — Telephone Encounter (Signed)
Patient's daughter Wayland Salinas called to renew Rx for  Atorvastatin (LIPITOR) 80 mg tablet.  Also needs Blood Glucose Monitoring Supplies (AGAMATRIX PRESTO) w/Device Kit.  Patient's daughter can be reached at [phone]

## 2018-05-06 NOTE — Telephone Encounter (Signed)
Spoke with daughter. Informed she is no l,onger a patient here and Rx's need to come from Dr. Raul Del office. Daughter verbalized understanding.

## 2018-05-07 ENCOUNTER — Other Ambulatory Visit: Payer: Self-pay | Admitting: Family Medicine

## 2018-05-07 ENCOUNTER — Telehealth: Payer: Self-pay | Admitting: Family Medicine

## 2018-05-07 DIAGNOSIS — Z794 Long term (current) use of insulin: Secondary | ICD-10-CM

## 2018-05-07 DIAGNOSIS — E1165 Type 2 diabetes mellitus with hyperglycemia: Secondary | ICD-10-CM

## 2018-05-07 DIAGNOSIS — E041 Nontoxic single thyroid nodule: Secondary | ICD-10-CM

## 2018-05-07 DIAGNOSIS — E042 Nontoxic multinodular goiter: Secondary | ICD-10-CM

## 2018-05-07 DIAGNOSIS — R7989 Other specified abnormal findings of blood chemistry: Secondary | ICD-10-CM

## 2018-05-07 MED ORDER — ATORVASTATIN CALCIUM 80 MG PO TABS: 80.0000 mg | ORAL_TABLET | Freq: Every day | ORAL | 1 refills | Status: DC

## 2018-05-07 MED ORDER — GLUCOSE BLOOD VI STRP: ORAL_STRIP | 4 refills | Status: DC

## 2018-05-07 NOTE — Telephone Encounter (Signed)
Sent in

## 2018-05-07 NOTE — Telephone Encounter (Signed)
PF 01/23/18 [facility] SECOND ORDER ADDED BECAUSE OF REASON FOR EXAM OF TWO NODULES BASED ON RECOMMENDATION OF U/S REPORT Recommend biopsy of the 2 most suspicious nodules, nodule 7 and 8, as detailed above. COSIGN REQUESTED    PLEASE REVIEW

## 2018-05-07 NOTE — Telephone Encounter (Signed)
Copied from [place] [id number]. Topic: Quick Communication - Rx Refill/Question >> May 07, 2018 12:28 PM Reyne Dumas L wrote: Medication: atorvastatin (LIPITOR) 80 MG tablet TS One Touch Ultra Blue test strips  Has the patient contacted their pharmacy? No - first time that physician is refilling.  Pt prefers a 90 day supply. (Agent: If no, request that the patient contact the pharmacy for the refill.) (Agent: If yes, when and what did the pharmacy advise?)  Preferred Pharmacy (with phone number or street name): [place], [place] [phone] (Phone) [phone] (Fax)  Agent: Please be advised that RX refills may take up to 3 business days. We ask that you follow-up with your pharmacy.

## 2018-05-07 NOTE — Telephone Encounter (Signed)
Yes, will be happy to cosign whenever order is sent to me.  Thanks so much for adding this -I guess an order per nodule is a new thing.

## 2018-05-07 NOTE — Telephone Encounter (Signed)
noted 

## 2018-05-09 ENCOUNTER — Encounter: Payer: Self-pay | Admitting: *Deleted

## 2018-05-09 NOTE — Telephone Encounter (Signed)
Spoke with [facility] imaging appts scheduled and orders signed. Dgaddy, CMA

## 2018-05-21 ENCOUNTER — Other Ambulatory Visit: Payer: Medicare Other

## 2018-05-21 ENCOUNTER — Inpatient Hospital Stay: Admission: RE | Admit: 2018-05-21 | Payer: Medicare Other | Source: Ambulatory Visit

## 2018-06-03 ENCOUNTER — Encounter: Payer: Self-pay | Admitting: Endocrinology

## 2018-06-06 ENCOUNTER — Ambulatory Visit: Payer: Medicare Other | Admitting: Family Medicine

## 2018-06-10 ENCOUNTER — Ambulatory Visit (INDEPENDENT_AMBULATORY_CARE_PROVIDER_SITE_OTHER): Payer: Medicare Other | Admitting: Physician Assistant

## 2018-06-10 ENCOUNTER — Other Ambulatory Visit: Payer: Self-pay

## 2018-06-10 ENCOUNTER — Encounter: Payer: Self-pay | Admitting: Physician Assistant

## 2018-06-10 VITALS — BP 111/68 | HR 73 | Temp 98.0°F | Resp 20 | Ht 62.99 in | Wt 140.0 lb

## 2018-06-10 DIAGNOSIS — Z23 Encounter for immunization: Secondary | ICD-10-CM | POA: Diagnosis not present

## 2018-06-10 DIAGNOSIS — J0100 Acute maxillary sinusitis, unspecified: Secondary | ICD-10-CM | POA: Diagnosis not present

## 2018-06-10 MED ORDER — AMOXICILLIN-POT CLAVULANATE 875-125 MG PO TABS: 1.0000 | ORAL_TABLET | Freq: Two times a day (BID) | ORAL | 0 refills | Status: AC

## 2018-06-10 NOTE — Progress Notes (Signed)
MRN: [id number] DOB: [birth year]  Subjective:   Alyssa Weaver is a 67 y.o. female presenting for chief complaint of Facial Pain (X 2.5 weeks- with some head pain) .  Reports 2.5 week history of sinus pressure. Has intermittent sinus headache. Has been having nasal drainage, which is clear. Has tried advil sinus and cold with some relief. Denies fever, ear pain, sore throat, productive cough,  wheezing, shortness of breath, chest pain and myalgia, dental pain,  chills, nausea, vomiting, abdominal pain, diarrhea and visual disturbance. No sick contact. Has history of seasonal allergies, takes zyrtec and nasacort daily. No history of asthma. Patient has had flu shot this season. Smokes 0.75 ppd x 43 years. PMH of diabetes, last a1c 8.6. Has f/u with endocrinology on 11/14.  Denies any other aggravating or relieving factors, no other questions or concerns.  Alyssa Weaver has a current medication list which includes the following prescription(s): atorvastatin, agamatrix presto, calcium citrate-vitamin d, cetirizine, esomeprazole, glucose blood, ibuprofen, insulin glargine, insulin lispro, meloxicam, metformin, mometasone, fish oil, aspirin ec, and polyethylene glycol powder. Also has No Known Allergies.  [place]  has a past medical history of Diabetes mellitus without complication ([place]) and Hyperlipidemia. Also  has a past surgical history that includes Tubal ligation (1985).   Objective:   Vitals: BP 111/68   Pulse 73   Temp 98 F (36.7 C) (Oral)   Resp 20   Ht 5' 2.99" (1.6 m)   Wt 140 lb (63.5 kg)   SpO2 96%   BMI 24.81 kg/m   Physical Exam  Constitutional: She is oriented to person, place, and time. She appears well-developed and well-nourished. No distress.  HENT:  Head: Normocephalic and atraumatic.  Right Ear: Tympanic membrane, external ear and ear canal normal.  Left Ear: Tympanic membrane, external ear and ear canal normal.  Nose: Mucosal edema present. No rhinorrhea. Right  sinus exhibits maxillary sinus tenderness. Right sinus exhibits no frontal sinus tenderness. Left sinus exhibits maxillary sinus tenderness. Left sinus exhibits no frontal sinus tenderness.  Mouth/Throat: Uvula is midline and mucous membranes are normal. No posterior oropharyngeal edema, posterior oropharyngeal erythema or tonsillar abscesses. No tonsillar exudate.  Eyes: Pupils are equal, round, and reactive to light. Conjunctivae and EOM are normal.  Neck: Normal range of motion.  Cardiovascular: Normal rate, regular rhythm, normal heart sounds and intact distal pulses.  Pulmonary/Chest: Effort normal and breath sounds normal. She has no decreased breath sounds. She has no wheezes. She has no rhonchi. She has no rales.  Lymphadenopathy:       Head (right side): No submental, no submandibular, no tonsillar, no preauricular, no posterior auricular and no occipital adenopathy present.       Head (left side): No submental, no submandibular, no tonsillar, no preauricular, no posterior auricular and no occipital adenopathy present.    She has no cervical adenopathy.       Right: No supraclavicular adenopathy present.       Left: No supraclavicular adenopathy present.  Neurological: She is alert and oriented to person, place, and time.  Skin: Skin is warm and dry.  Psychiatric: She has a normal mood and affect.  Vitals reviewed.   No results found for this or any previous visit (from the past 24 hour(s)).  Assessment and Plan :  1. Acute maxillary sinusitis, recurrence not specified Hx and PE findings consistent with sinusitis. Due to duration and no full resolution with conservative tx, will tx with oral abx at this time. Advised to  return to clinic if symptoms worsen, do not improve, or as needed.  - amoxicillin-clavulanate (AUGMENTIN) 875-125 MG tablet; Take 1 tablet by mouth 2 (two) times daily for 7 days.  Dispense: 14 tablet; Refill: 0  2. Need for prophylactic vaccination and inoculation  against influenza - Flu vaccine HIGH DOSE PF   Tenna Delaine, PA-C  Primary Care at [facility] 06/10/2018 12:13 PM

## 2018-06-10 NOTE — Patient Instructions (Addendum)
  Please take antibiotic as prescribed. I also recommend over the counter nasal saline rinse spray, you can get this at the pharmacy.   Sinusitis, Adult Sinusitis is soreness and inflammation of your sinuses. Sinuses are hollow spaces in the bones around your face. They are located:  Around your eyes.  In the middle of your forehead.  Behind your nose.  In your cheekbones.  Your sinuses and nasal passages are lined with a stringy fluid (mucus). Mucus normally drains out of your sinuses. When your nasal tissues get inflamed or swollen, the mucus can get trapped or blocked so air cannot flow through your sinuses. This lets bacteria, viruses, and funguses grow, and that leads to infection. Follow these instructions at home: Medicines  Take, use, or apply over-the-counter and prescription medicines only as told by your doctor. These may include nasal sprays.  If you were prescribed an antibiotic medicine, take it as told by your doctor. Do not stop taking the antibiotic even if you start to feel better. Hydrate and Humidify  Drink enough water to keep your pee (urine) clear or pale yellow.  Use a cool mist humidifier to keep the humidity level in your home above 50%.  Breathe in steam for 10-15 minutes, 3-4 times a day or as told by your doctor. You can do this in the bathroom while a hot shower is running.  Try not to spend time in cool or dry air. Rest  Rest as much as possible.  Sleep with your head raised (elevated).  Make sure to get enough sleep each night. General instructions  Put a warm, moist washcloth on your face 3-4 times a day or as told by your doctor. This will help with discomfort.  Wash your hands often with soap and water. If there is no soap and water, use hand sanitizer.  Do not smoke. Avoid being around people who are smoking (secondhand smoke).  Keep all follow-up visits as told by your doctor. This is important. Contact a doctor if:  You have a  fever.  Your symptoms get worse.  Your symptoms do not get better within 10 days. Get help right away if:  You have a very bad headache.  You cannot stop throwing up (vomiting).  You have pain or swelling around your face or eyes.  You have trouble seeing.  You feel confused.  Your neck is stiff.  You have trouble breathing. This information is not intended to replace advice given to you by your health care provider. Make sure you discuss any questions you have with your health care provider. Document Released: 01/03/2008 Document Revised: 03/12/2016 Document Reviewed: 05/12/2015 Elsevier Interactive Patient Education  2018 [employer].   IF you received an x-ray today, you will receive an invoice from [facility]. Please contact [facility] at [phone] with questions or concerns regarding your invoice.   IF you received labwork today, you will receive an invoice from [place]. Please contact [facility] at [phone] with questions or concerns regarding your invoice.   Our billing staff will not be able to assist you with questions regarding bills from these companies.  You will be contacted with the lab results as soon as they are available. The fastest way to get your results is to activate your My Chart account. Instructions are located on the last page of this paperwork. If you have not heard from [place] regarding the results in 2 weeks, please contact this office.

## 2018-06-13 ENCOUNTER — Other Ambulatory Visit (HOSPITAL_COMMUNITY)
Admission: RE | Admit: 2018-06-13 | Discharge: 2018-06-13 | Disposition: A | Discharge status: Home / Self Care | Payer: Medicare Other | Source: Ambulatory Visit | Attending: Radiology | Admitting: Radiology

## 2018-06-13 ENCOUNTER — Ambulatory Visit
Admission: RE | Admit: 2018-06-13 | Discharge: 2018-06-13 | Disposition: A | Discharge status: Home / Self Care | Payer: Medicare Other | Source: Ambulatory Visit | Attending: Family Medicine | Admitting: Family Medicine

## 2018-06-13 DIAGNOSIS — E042 Nontoxic multinodular goiter: Secondary | ICD-10-CM

## 2018-06-13 DIAGNOSIS — E041 Nontoxic single thyroid nodule: Secondary | ICD-10-CM | POA: Insufficient documentation

## 2018-06-13 DIAGNOSIS — R7989 Other specified abnormal findings of blood chemistry: Secondary | ICD-10-CM

## 2018-06-13 DIAGNOSIS — Z0101 Encounter for examination of eyes and vision with abnormal findings: Secondary | ICD-10-CM | POA: Diagnosis not present

## 2018-06-13 DIAGNOSIS — E133292 Other specified diabetes mellitus with mild nonproliferative diabetic retinopathy without macular edema, left eye: Secondary | ICD-10-CM | POA: Diagnosis not present

## 2018-06-13 LAB — HM DIABETES EYE EXAM

## 2018-07-01 ENCOUNTER — Telehealth: Payer: Self-pay | Admitting: Family Medicine

## 2018-07-01 NOTE — Telephone Encounter (Signed)
Patient is wanting a call back about her results from [place] done on 06/13/2018.

## 2018-07-02 NOTE — Telephone Encounter (Signed)
pls see note 

## 2018-07-19 ENCOUNTER — Ambulatory Visit (INDEPENDENT_AMBULATORY_CARE_PROVIDER_SITE_OTHER): Payer: Medicare Other | Admitting: Family Medicine

## 2018-07-19 ENCOUNTER — Encounter: Payer: Self-pay | Admitting: Family Medicine

## 2018-07-19 ENCOUNTER — Other Ambulatory Visit: Payer: Self-pay

## 2018-07-19 VITALS — BP 125/70 | HR 95 | Temp 97.7°F | Ht 63.0 in | Wt 139.6 lb

## 2018-07-19 DIAGNOSIS — Z794 Long term (current) use of insulin: Secondary | ICD-10-CM

## 2018-07-19 DIAGNOSIS — E0590 Thyrotoxicosis, unspecified without thyrotoxic crisis or storm: Secondary | ICD-10-CM | POA: Diagnosis not present

## 2018-07-19 DIAGNOSIS — R7989 Other specified abnormal findings of blood chemistry: Secondary | ICD-10-CM

## 2018-07-19 DIAGNOSIS — J0121 Acute recurrent ethmoidal sinusitis: Secondary | ICD-10-CM | POA: Diagnosis not present

## 2018-07-19 DIAGNOSIS — E1165 Type 2 diabetes mellitus with hyperglycemia: Secondary | ICD-10-CM | POA: Diagnosis not present

## 2018-07-19 DIAGNOSIS — F17218 Nicotine dependence, cigarettes, with other nicotine-induced disorders: Secondary | ICD-10-CM

## 2018-07-19 DIAGNOSIS — E782 Mixed hyperlipidemia: Secondary | ICD-10-CM

## 2018-07-19 LAB — POCT GLYCOSYLATED HEMOGLOBIN (HGB A1C): Hemoglobin A1C: 8.6 % — AB (ref 4.0–5.6)

## 2018-07-19 MED ORDER — AZITHROMYCIN 250 MG PO TABS: ORAL_TABLET | ORAL | 0 refills | Status: DC

## 2018-07-19 MED ORDER — INSULIN LISPRO (1 UNIT DIAL) 100 UNIT/ML (KWIKPEN): 5.0000 [IU] | PEN_INJECTOR | Freq: Three times a day (TID) | SUBCUTANEOUS | 99 refills | Status: DC

## 2018-07-19 MED ORDER — MOMETASONE FUROATE 50 MCG/ACT NA SUSP: NASAL | 12 refills | Status: DC

## 2018-07-19 MED ORDER — INSULIN GLARGINE 100 UNIT/ML SOLOSTAR PEN: 20.0000 [IU] | PEN_INJECTOR | SUBCUTANEOUS | 99 refills | Status: DC

## 2018-07-19 MED ORDER — LEVOFLOXACIN 750 MG PO TABS: 750.0000 mg | ORAL_TABLET | Freq: Every day | ORAL | 0 refills | Status: DC

## 2018-07-19 MED ORDER — ATORVASTATIN CALCIUM 80 MG PO TABS: 80.0000 mg | ORAL_TABLET | Freq: Every day | ORAL | 1 refills | Status: DC

## 2018-07-19 MED ORDER — METFORMIN HCL 1000 MG PO TABS: 1000.0000 mg | ORAL_TABLET | Freq: Two times a day (BID) | ORAL | 1 refills | Status: DC

## 2018-07-19 MED ORDER — METHYLPREDNISOLONE ACETATE 80 MG/ML IJ SUSP
80.0000 mg | Freq: Once | INTRAMUSCULAR | Status: AC
  Administered 2018-07-19: 80 mg via INTRAMUSCULAR

## 2018-07-19 NOTE — Patient Instructions (Addendum)
Continue on the Lantus 20 units every morning. Start taking the Humalog 5 units before every meal.     - If your blood sugar is > 200 before eating increase the dose to Humalog 10 units.     - If your blood sugar is > 200 and you are not planning on eating at that time, give yourself Humalog 5 units  Start the azithromycin antibiotic tonight. If you are not improving or worsening again by 07/31/2018, you may fill the levaquin prescription (you can bring this paper prescription with you on your visit to [place] and they can fill it there if you need it.)   If your symptoms do not resolve after the levaquin, return to clinic asap as you will need a CT scan and a specialty referral.    If you have lab work done today you will be contacted with your lab results within the next 2 weeks.  If you have not heard from [place] then please contact us. The fastest way to get your results is to register for My Chart.   IF you received an x-ray today, you will receive an invoice from [facility]. Please contact [facility] at [phone] with questions or concerns regarding your invoice.   IF you received labwork today, you will receive an invoice from [place]. Please contact [facility] at [phone] with questions or concerns regarding your invoice.   Our billing staff will not be able to assist you with questions regarding bills from these companies.  You will be contacted with the lab results as soon as they are available. The fastest way to get your results is to activate your My Chart account. Instructions are located on the last page of this paperwork. If you have not heard from [place] regarding the results in 2 weeks, please contact this office.     Hyperthyroidism  Hyperthyroidism is when the thyroid gland is too active (overactive). The thyroid gland is a small gland located in the lower front part of the neck, just in front of the windpipe (trachea). This gland makes hormones that  help control how the body uses food for energy (metabolism) as well as how the heart and brain function. These hormones also play a role in keeping your bones strong. When the thyroid is overactive, it produces too much of a hormone called thyroxine. What are the causes? This condition may be caused by:  Graves' disease. This is a disorder in which the body's disease-fighting system (immune system) attacks the thyroid gland. This is the most common cause.  Inflammation of the thyroid gland.  A tumor in the thyroid gland.  Use of certain medicines, including: ? Prescription thyroid hormone replacement. ? Herbal supplements that mimic thyroid hormones. ? Amiodarone therapy.  Solid or fluid-filled lumps within your thyroid gland (thyroid nodules).  Taking in a large amount of iodine from foods or medicines. What increases the risk? You are more likely to develop this condition if:  You are female.  You have a family history of thyroid conditions.  You smoke tobacco.  You use a medicine called lithium.  You take medicines that affect the immune system (immunosuppressants). What are the signs or symptoms? Symptoms of this condition include:  Nervousness.  Inability to tolerate heat.  Unexplained weight loss.  Diarrhea.  Change in the texture of hair or skin.  Heart skipping beats or making extra beats.  Rapid heart rate.  Loss of menstruation.  Shaky hands.  Fatigue.  Restlessness.  Sleep problems.  Enlarged thyroid gland or a lump in the thyroid (nodule). You may also have symptoms of Graves' disease, which may include:  Protruding eyes.  Dry eyes.  Red or swollen eyes.  Problems with vision. How is this diagnosed? This condition may be diagnosed based on:  Your symptoms and medical history.  A physical exam.  Blood tests.  Thyroid ultrasound. This test involves using sound waves to produce images of the thyroid gland.  A thyroid scan. A  radioactive substance is injected into a vein, and images show how much iodine is present in the thyroid.  Radioactive iodine uptake test (RAIU). A small amount of radioactive iodine is given by mouth to see how much iodine the thyroid absorbs after a certain amount of time. How is this treated? Treatment depends on the cause and severity of the condition. Treatment may include:  Medicines to reduce the amount of thyroid hormone your body makes.  Radioactive iodine treatment (radioiodine therapy). This involves swallowing a small dose of radioactive iodine, in capsule or liquid form, to kill thyroid cells.  Surgery to remove part or all of your thyroid gland. You may need to take thyroid hormone replacement medicine for the rest of your life after thyroid surgery.  Medicines to help manage your symptoms. Follow these instructions at home:   Take over-the-counter and prescription medicines only as told by your health care provider.  Do not use any products that contain nicotine or tobacco, such as cigarettes and e-cigarettes. If you need help quitting, ask your health care provider.  Follow any instructions from your health care provider about diet. You may be instructed to limit foods that contain iodine.  Keep all follow-up visits as told by your health care provider. This is important. ? You will need to have blood tests regularly so that your health care provider can monitor your condition. Contact a health care provider if:  Your symptoms do not get better with treatment.  You have a fever.  You are taking thyroid hormone replacement medicine and you: ? Have symptoms of depression. ? Feel like you are tired all the time. ? Gain weight. Get help right away if:  You have chest pain.  You have decreased alertness or a change in your awareness.  You have abdominal pain.  You feel dizzy.  You have a rapid heartbeat.  You have an irregular heartbeat.  You have difficulty  breathing. Summary  The thyroid gland is a small gland located in the lower front part of the neck, just in front of the windpipe (trachea).  Hyperthyroidism is when the thyroid gland is too active (overactive) and produces too much of a hormone called thyroxine.  The most common cause is Graves' disease, a disorder in which your immune system attacks the thyroid gland.  Hyperthyroidism can cause various symptoms, such as unexplained weight loss, nervousness, inability to tolerate heat, or changes in your heartbeat.  Treatment may include medicine to reduce the amount of thyroid hormone your body makes, radioiodine therapy, surgery, or medicines to manage symptoms. This information is not intended to replace advice given to you by your health care provider. Make sure you discuss any questions you have with your health care provider. Document Released: 07/17/2005 Document Revised: 06/27/2017 Document Reviewed: 06/27/2017 Elsevier Interactive Patient Education  2019 [employer].

## 2018-07-19 NOTE — Telephone Encounter (Signed)
WIL DISCUSS W/ PT AT HER VISIT TODAY

## 2018-07-19 NOTE — Progress Notes (Signed)
Subjective:    Patient: Alyssa Weaver  DOB: [birth year]; 67 y.o.   MRN: [id number]  Chief Complaint  Patient presents with  . Nasal Congestion    ongoing   . THYROID test results    HPI Has chronic insomnia - onoing chronic issue.  Current having sinus pressure pain.  Seen for acute maxillary sinusitis 11/11 and treated with Augmentin bid x 1 wk which helped but never all the way better and then worsened again as soon as abx was stopped. No f/c.  Pain over ethmoidal sinusitis B but no teeth/gum pain.  Not able to blow out any congestion. A little dry cough before bed only. No other otc meds.     Denies any hyperthyroid sxs.  cbgs low 100-120 fasting but then after ating will shoot up to 200.  Two or 3 times her cbgs have shot up that week.  She only finished her first humalog pen this week. Usually before lunch it runs between 130-140  Lab Results  Component Value Date   HGBA1C 8.6 (A) 07/19/2018   HGBA1C 8.6 (H) 03/06/2018   HGBA1C 10.0 (H) 11/22/2017     Medical History Past Medical History:  Diagnosis Date  . Diabetes mellitus without complication ([place])   . Hyperlipidemia    Past Surgical History:  Procedure Laterality Date  . TUBAL LIGATION  1985   Current Outpatient Medications on File Prior to Visit  Medication Sig Dispense Refill  . atorvastatin (LIPITOR) 80 MG tablet Take 1 tablet (80 mg total) by mouth daily. 90 tablet 1  . Blood Glucose Monitoring Suppl (AGAMATRIX PRESTO) w/Device KIT Twice daily sugar checks before meals 1 kit 0  . Calcium Citrate-Vitamin D (CITRACAL/VITAMIN D) 250-200 MG-UNIT TABS 2 tabs by mouth twice daily 120 each   . cetirizine (ZYRTEC) 10 MG tablet Take 1 tablet (10 mg total) by mouth daily. 30 tablet 11  . glucose blood (ONE TOUCH ULTRA TEST) test strip Use to check CBGs qam fasting, 2 hrs postprandial, & if having hyper or hypoglycemic sxs. Dx E11.65, Z79.4 400 each 4  . ibuprofen (ADVIL,MOTRIN) 200 MG tablet Take 200 mg by mouth  every 6 (six) hours as needed.    . Insulin Glargine (LANTUS SOLOSTAR) 100 UNIT/ML Solostar Pen Inject 20 Units into the skin every morning. 5 pen PRN  . insulin lispro (HUMALOG KWIKPEN) 100 UNIT/ML KiwkPen Inject 0.05 mLs (5 Units total) into the skin 3 (three) times daily before meals. If cbg >150 15 mL 1  . meloxicam (MOBIC) 15 MG tablet Take 1 tablet (15 mg total) by mouth daily. 30 tablet 3  . metFORMIN (GLUCOPHAGE) 1000 MG tablet Take 1 tablet (1,000 mg total) by mouth 2 (two) times daily with a meal. 180 tablet 1  . mometasone (NASONEX) 50 MCG/ACT nasal spray 2 sprays each nostril daily 17 g 12  . aspirin EC 81 MG tablet Take 1 tablet (81 mg total) by mouth daily. With a meal (Patient not taking: Reported on 06/10/2018)    . esomeprazole (NEXIUM) 40 MG capsule 1 capsule by mouth on Weaver empty stomach (Patient not taking: Reported on 07/19/2018) 90 capsule 3  . Omega-3 Fatty Acids (FISH OIL) 1000 MG CAPS 1 cap by mouth twice daily (Patient not taking: Reported on 07/19/2018)  0  . polyethylene glycol powder (GLYCOLAX/MIRALAX) powder 17 g in 8 ounces fluid once daily (Patient not taking: Reported on 01/07/2018) 578 g 11   No current facility-administered medications on file prior to visit.  No Known Allergies Family History  Problem Relation Age of Onset  . Asthma Brother    Social History   Socioeconomic History  . Marital status: Married    Spouse name: [name]  . Number of children: 5  . Years of education: 76  . Highest education level: Not on file  Occupational History  . Occupation: housewife  Social Needs  . Financial resource strain: Not on file  . Food insecurity:    Worry: Not on file    Inability: Not on file  . Transportation needs:    Medical: Not on file    Non-medical: Not on file  Tobacco Use  . Smoking status: Current Every Day Smoker    Packs/day: 0.75    Years: 43.00    Pack years: 32.25    Types: Cigarettes  . Smokeless tobacco: Never Used  . Tobacco  comment: Husband smokes 1 ppd.  He might be willing to quit at same time.  Substance and Sexual Activity  . Alcohol use: No  . Drug use: No  . Sexual activity: Yes    Birth control/protection: Post-menopausal  Lifestyle  . Physical activity:    Days per week: Not on file    Minutes per session: Not on file  . Stress: Not on file  Relationships  . Social connections:    Talks on phone: Not on file    Gets together: Not on file    Attends religious service: Not on file    Active member of club or organization: Not on file    Attends meetings of clubs or organizations: Not on file    Relationship status: Not on file  Other Topics Concern  . Not on file  Social History Narrative   Originally from [place]   Refugees--lived in [place] 2 years.   Came to the [place] In [place].   Lived in the [place] until moving to [facility] Nov. 2016   Depression screen [facility] 2/9 07/19/2018 06/10/2018 01/07/2018 12/07/2017 10/08/2017  Decreased Interest 0 0 0 0 0  Down, Depressed, Hopeless 0 0 0 0 0  PHQ - 2 Score 0 0 0 0 0  Altered sleeping - - - - -  Tired, decreased energy - - - - -  Change in appetite - - - - -  Feeling bad or failure about yourself  - - - - -  Trouble concentrating - - - - -  Moving slowly or fidgety/restless - - - - -  Suicidal thoughts - - - - -  PHQ-9 Score - - - - -    ROS As noted in HPI  Objective:  BP 125/70 (BP Location: Right Arm, Patient Position: Sitting, Cuff Size: Normal)   Pulse 95   Temp 97.7 F (36.5 C) (Oral)   Ht _0  (1.6 m)   Wt 139 lb 9.6 oz (63.3 kg)   SpO2 95%   BMI 24.73 kg/m  Physical Exam Constitutional:      General: She is not in acute distress.    Appearance: She is well-developed. She is not diaphoretic.  HENT:     Head: Normocephalic and atraumatic.     Right Ear: External ear normal.     Left Ear: External ear normal.  Eyes:     General: No scleral icterus.    Conjunctiva/sclera: Conjunctivae normal.  Neck:     Musculoskeletal:  Normal range of motion and neck supple.     Thyroid: No thyromegaly.  Cardiovascular:  Rate and Rhythm: Normal rate and regular rhythm.     Heart sounds: Normal heart sounds.  Pulmonary:     Effort: Pulmonary effort is normal. No respiratory distress.     Breath sounds: Normal breath sounds.  Lymphadenopathy:     Cervical: No cervical adenopathy.  Skin:    General: Skin is warm and dry.     Findings: No erythema.  Neurological:     Mental Status: She is alert and oriented to person, place, and time.  Psychiatric:        Behavior: Behavior normal.     [place] TESTING Office Visit on 07/19/2018  Component Date Value Ref Range Status  . Hemoglobin A1C 07/19/2018 8.6* 4.0 - 5.6 % Final     Assessment & Plan:   1. Type 2 diabetes mellitus with hyperglycemia, with long-term current use of insulin ([place])   2. Abnormal TSH   3. Hyperthyroidism - make appt w/ [facility] endo as prev referred for further eval - gave contact info to call and sched this  4. Acute recurrent ethmoidal sinusitis   5. Mixed hyperlipidemia   6. Cigarette nicotine dependence with other nicotine-induced disorder     Patient will continue on current chronic medications other than changes noted above, so ok to refill when needed.   See after visit summary for patient specific instructions.  Orders Placed This Encounter  Procedures  . Comprehensive metabolic panel  . Microalbumin / creatinine urine ratio  . TSH+T4F+T3Free  . POCT glycosylated hemoglobin (Hb A1C)    Meds ordered this encounter  Medications  . methylPREDNISolone acetate (DEPO-MEDROL) injection 80 mg  . Insulin Glargine (LANTUS SOLOSTAR) 100 UNIT/ML Solostar Pen    Sig: Inject 20 Units into the skin every morning.    Dispense:  5 pen    Refill:  PRN  . metFORMIN (GLUCOPHAGE) 1000 MG tablet    Sig: Take 1 tablet (1,000 mg total) by mouth 2 (two) times daily with a meal.    Dispense:  180 tablet    Refill:  1  . azithromycin (ZITHROMAX)  250 MG tablet    Sig: Take 2 tabs PO x 1 dose, then 1 tab PO QD x 4 days    Dispense:  6 tablet    Refill:  0  . levofloxacin (LEVAQUIN) 750 MG tablet    Sig: Take 1 tablet (750 mg total) by mouth daily.    Dispense:  7 tablet    Refill:  0  . insulin lispro (HUMALOG KWIKPEN) 100 UNIT/ML KwikPen    Sig: Inject 0.05 mLs (5 Units total) into the skin 3 (three) times daily before meals. Increase dose to 0.35m (10u) if cbg >200 before a meal.    Dispense:  15 mL    Refill:  prn  . atorvastatin (LIPITOR) 80 MG tablet    Sig: Take 1 tablet (80 mg total) by mouth daily.    Dispense:  90 tablet    Refill:  1  . mometasone (NASONEX) 50 MCG/ACT nasal spray    Sig: 2 sprays each nostril daily    Dispense:  17 g    Refill:  12    Patient verbalized to me that they understand the following: diagnosis, what is being done for them, what to expect and what should be done at home.  Their questions have been answered. They understand that I am unable to predict every possible medication interaction or adverse outcome and that if any unexpected symptoms arise, they should contact  us and their pharmacist, as well as never hesitate to seek urgent/emergent care at [facility] or ER if they think it might be warranted.    Delman Cheadle, MD, MPH [facility] at [facility] [place], [place]  [phone] Office phone  [phone] Office fax  07/19/18 4:25 PM

## 2018-07-20 LAB — COMPREHENSIVE METABOLIC PANEL
A/G RATIO: 2 (ref 1.2–2.2)
ALT: 19 IU/L (ref 0–32)
AST: 21 IU/L (ref 0–40)
Albumin: 4.4 g/dL (ref 3.6–4.8)
Alkaline Phosphatase: 67 IU/L (ref 39–117)
BUN/Creatinine Ratio: 22 (ref 12–28)
BUN: 17 mg/dL (ref 8–27)
Bilirubin Total: <0.2 mg/dL (ref 0.0–1.2)
CALCIUM: 9.3 mg/dL (ref 8.7–10.3)
CO2: 20 mmol/L (ref 20–29)
CREATININE: 0.78 mg/dL (ref 0.57–1.00)
Chloride: 100 mmol/L (ref 96–106)
GFR, EST AFRICAN AMERICAN: 92 mL/min/{1.73_m2} (ref >59)
GFR, EST NON AFRICAN AMERICAN: 79 mL/min/{1.73_m2} (ref >59)
GLOBULIN, TOTAL: 2.2 g/dL (ref 1.5–4.5)
Glucose: 379 mg/dL — ABNORMAL HIGH (ref 65–99)
POTASSIUM: 4.4 mmol/L (ref 3.5–5.2)
SODIUM: 139 mmol/L (ref 134–144)
TOTAL PROTEIN: 6.6 g/dL (ref 6.0–8.5)

## 2018-07-20 LAB — MICROALBUMIN / CREATININE URINE RATIO: Creatinine, Urine: 46.7 mg/dL

## 2018-07-20 LAB — TSH+T4F+T3FREE
FREE T4: 1.31 ng/dL (ref 0.82–1.77)
T3 FREE: 2.8 pg/mL (ref 2.0–4.4)
TSH: 0.196 u[IU]/mL — ABNORMAL LOW (ref 0.450–4.500)

## 2018-07-22 ENCOUNTER — Encounter: Payer: Self-pay | Admitting: *Deleted

## 2018-07-22 NOTE — Progress Notes (Signed)
Letter sent.

## 2018-10-07 ENCOUNTER — Encounter: Payer: Self-pay | Admitting: Emergency Medicine

## 2018-10-07 ENCOUNTER — Ambulatory Visit (INDEPENDENT_AMBULATORY_CARE_PROVIDER_SITE_OTHER): Payer: Medicare Other | Admitting: Emergency Medicine

## 2018-10-07 ENCOUNTER — Other Ambulatory Visit: Payer: Self-pay

## 2018-10-07 VITALS — BP 118/75 | HR 90 | Temp 97.5°F | Resp 18 | Ht 63.0 in | Wt 136.2 lb

## 2018-10-07 DIAGNOSIS — N949 Unspecified condition associated with female genital organs and menstrual cycle: Secondary | ICD-10-CM | POA: Insufficient documentation

## 2018-10-07 DIAGNOSIS — N390 Urinary tract infection, site not specified: Secondary | ICD-10-CM

## 2018-10-07 DIAGNOSIS — R300 Dysuria: Secondary | ICD-10-CM

## 2018-10-07 LAB — POCT URINALYSIS DIP (MANUAL ENTRY)
Bilirubin, UA: NEGATIVE
Glucose, UA: 500 mg/dL — AB
Nitrite, UA: NEGATIVE
SPEC GRAV UA: 1.02 (ref 1.010–1.025)
UROBILINOGEN UA: 0.2 U/dL
pH, UA: 5.5 (ref 5.0–8.0)

## 2018-10-07 MED ORDER — CIPROFLOXACIN HCL 500 MG PO TABS: 500.0000 mg | ORAL_TABLET | Freq: Two times a day (BID) | ORAL | 0 refills | Status: AC

## 2018-10-07 NOTE — Patient Instructions (Addendum)
If you have lab work done today you will be contacted with your lab results within the next 2 weeks.  If you have not heard from [place] then please contact us. The fastest way to get your results is to register for My Chart.   IF you received an x-ray today, you will receive an invoice from [facility]. Please contact [facility] at [phone] with questions or concerns regarding your invoice.   IF you received labwork today, you will receive an invoice from [facility]. Please contact [facility] at [phone] with questions or concerns regarding your invoice.   Our billing staff will not be able to assist you with questions regarding bills from these companies.  You will be contacted with the lab results as soon as they are available. The fastest way to get your results is to activate your My Chart account. Instructions are located on the last page of this paperwork. If you have not heard from [place] regarding the results in 2 weeks, please contact this office.     Urinary Tract Infection, Adult A urinary tract infection (UTI) is an infection of any part of the urinary tract. The urinary tract includes:  The kidneys.  The ureters.  The bladder.  The urethra. These organs make, store, and get rid of pee (urine) in the body. What are the causes? This is caused by germs (bacteria) in your genital area. These germs grow and cause swelling (inflammation) of your urinary tract. What increases the risk? You are more likely to develop this condition if:  You have a small, thin tube (catheter) to drain pee.  You cannot control when you pee or poop (incontinence).  You are female, and: ? You use these methods to prevent pregnancy: ? A medicine that kills sperm (spermicide). ? A device that blocks sperm (diaphragm). ? You have low levels of a female hormone (estrogen). ? You are pregnant.  You have genes that add to your risk.  You are sexually active.  You take  antibiotic medicines.  You have trouble peeing because of: ? A prostate that is bigger than normal, if you are female. ? A blockage in the part of your body that drains pee from the bladder (urethra). ? A kidney stone. ? A nerve condition that affects your bladder (neurogenic bladder). ? Not getting enough to drink. ? Not peeing often enough.  You have other conditions, such as: ? Diabetes. ? A weak disease-fighting system (immune system). ? Sickle cell disease. ? Gout. ? Injury of the spine. What are the signs or symptoms? Symptoms of this condition include:  Needing to pee right away (urgently).  Peeing often.  Peeing small amounts often.  Pain or burning when peeing.  Blood in the pee.  Pee that smells bad or not like normal.  Trouble peeing.  Pee that is cloudy.  Fluid coming from the vagina, if you are female.  Pain in the belly or lower back. Other symptoms include:  Throwing up (vomiting).  No urge to eat.  Feeling mixed up (confused).  Being tired and grouchy (irritable).  A fever.  Watery poop (diarrhea). How is this treated? This condition may be treated with:  Antibiotic medicine.  Other medicines.  Drinking enough water. Follow these instructions at home:  Medicines  Take over-the-counter and prescription medicines only as told by your doctor.  If you were prescribed an antibiotic medicine, take it as told by your doctor. Do not stop taking it even if  you start to feel better. General instructions  Make sure you: ? Pee until your bladder is empty. ? Do not hold pee for a long time. ? Empty your bladder after sex. ? Wipe from front to back after pooping if you are a female. Use each tissue one time when you wipe.  Drink enough fluid to keep your pee pale yellow.  Keep all follow-up visits as told by your doctor. This is important. Contact a doctor if:  You do not get better after 1-2 days.  Your symptoms go away and then come  back. Get help right away if:  You have very bad back pain.  You have very bad pain in your lower belly.  You have a fever.  You are sick to your stomach (nauseous).  You are throwing up. Summary  A urinary tract infection (UTI) is an infection of any part of the urinary tract.  This condition is caused by germs in your genital area.  There are many risk factors for a UTI. These include having a small, thin tube to drain pee and not being able to control when you pee or poop.  Treatment includes antibiotic medicines for germs.  Drink enough fluid to keep your pee pale yellow. This information is not intended to replace advice given to you by your health care provider. Make sure you discuss any questions you have with your health care provider. Document Released: 01/03/2008 Document Revised: 01/24/2018 Document Reviewed: 01/24/2018 Elsevier Interactive Patient Education  2019 Elsevier Inc.  

## 2018-10-07 NOTE — Progress Notes (Signed)
Alyssa Weaver 68 y.o.   Chief Complaint  Patient presents with  . Dysuria    feels like something is dropping inside her and having some blood in urine started friday     HISTORY OF PRESENT ILLNESS: This is a 68 y.o. female complaining of vaginal fullness along with dysuria and blood in the urine for the past couple of days.  No other significant symptoms.  No fever or chills.  Denies abdominal or pelvic pain.  No flank pain.  HPI   Prior to Admission medications   Medication Sig Start Date End Date Taking? Authorizing Provider  aspirin EC 81 MG tablet Take 1 tablet (81 mg total) by mouth daily. With a meal 10/30/16  Yes Mack Hook, MD  atorvastatin (LIPITOR) 80 MG tablet Take 1 tablet (80 mg total) by mouth daily. 07/19/18  Yes Shawnee Knapp, MD  azithromycin (ZITHROMAX) 250 MG tablet Take 2 tabs PO x 1 dose, then 1 tab PO QD x 4 days 07/19/18  Yes Shawnee Knapp, MD  Blood Glucose Monitoring Suppl [facility] PRESTO) w/Device KIT Twice daily sugar checks before meals 04/18/17  Yes Mack Hook, MD  Calcium Citrate-Vitamin D (CITRACAL/VITAMIN D) 250-200 MG-UNIT TABS 2 tabs by mouth twice daily 08/24/17  Yes Mack Hook, MD  cetirizine (ZYRTEC) 10 MG tablet Take 1 tablet (10 mg total) by mouth daily. 12/07/17  Yes Shawnee Knapp, MD  glucose blood (ONE TOUCH ULTRA TEST) test strip Use to check CBGs qam fasting, 2 hrs postprandial, & if having hyper or hypoglycemic sxs. Dx E11.65, Z79.4 05/07/18  Yes Shawnee Knapp, MD  ibuprofen (ADVIL,MOTRIN) 200 MG tablet Take 200 mg by mouth every 6 (six) hours as needed.   Yes [provider]  Insulin Glargine (LANTUS SOLOSTAR) 100 UNIT/ML Solostar Pen Inject 20 Units into the skin every morning. 07/19/18  Yes Shawnee Knapp, MD  insulin lispro (HUMALOG KWIKPEN) 100 UNIT/ML KwikPen Inject 0.05 mLs (5 Units total) into the skin 3 (three) times daily before meals. Increase dose to 0.49m (10u) if cbg >200 before a meal. 07/19/18  Yes SShawnee Knapp MD  levofloxacin (LEVAQUIN) 750 MG tablet Take 1 tablet (750 mg total) by mouth daily. 07/31/18  Yes SShawnee Knapp MD  metFORMIN (GLUCOPHAGE) 1000 MG tablet Take 1 tablet (1,000 mg total) by mouth 2 (two) times daily with a meal. 07/19/18  Yes SShawnee Knapp MD  mometasone (NASONEX) 50 MCG/ACT nasal spray 2 sprays each nostril daily 07/19/18  Yes SShawnee Knapp MD    No Known Allergies  Patient Active Problem List   Diagnosis Date Noted  . Tobacco use disorder 01/07/2018  . Abnormal TSH 01/07/2018  . Seasonal allergic rhinitis due to pollen 01/07/2018  . Gastritis without bleeding 06/12/2017  . Environmental allergies   . Type 2 diabetes mellitus with hyperglycemia, with long-term current use of insulin ([place] 01/04/2016  . Hyperlipidemia 01/04/2016  . Left shoulder pain 01/04/2016    Past Medical History:  Diagnosis Date  . Diabetes mellitus without complication [place]   . Hyperlipidemia     Past Surgical History:  Procedure Laterality Date  . TUBAL LIGATION  1985    Social History   Socioeconomic History  . Marital status: Married    Spouse name: SMargorie John . Number of children: 5  . Years of education: 143 . Highest education level: Not on file  Occupational History  . Occupation: housewife  Social Needs  . Financial resource strain:  Not on file  . Food insecurity:    Worry: Not on file    Inability: Not on file  . Transportation needs:    Medical: Not on file    Non-medical: Not on file  Tobacco Use  . Smoking status: Current Every Day Smoker    Packs/day: 0.75    Years: 43.00    Pack years: 32.25    Types: Cigarettes  . Smokeless tobacco: Never Used  . Tobacco comment: Husband smokes 1 ppd.  He might be willing to quit at same time.  Substance and Sexual Activity  . Alcohol use: No  . Drug use: No  . Sexual activity: Yes    Birth control/protection: Post-menopausal  Lifestyle  . Physical activity:    Days per week: Not on file    Minutes per session: Not  on file  . Stress: Not on file  Relationships  . Social connections:    Talks on phone: Not on file    Gets together: Not on file    Attends religious service: Not on file    Active member of club or organization: Not on file    Attends meetings of clubs or organizations: Not on file    Relationship status: Not on file  . Intimate partner violence:    Fear of current or ex partner: Not on file    Emotionally abused: Not on file    Physically abused: Not on file    Forced sexual activity: Not on file  Other Topics Concern  . Not on file  Social History Narrative   Originally from [place]   Refugees--lived in [place] 2 years.   Came to the [place].   Lived in the [place] until moving to [facility] Nov. 2016    Family History  Problem Relation Age of Onset  . Asthma Brother      Review of Systems  Constitutional: Negative.  Negative for chills and fever.  HENT: Negative.   Respiratory: Negative.   Cardiovascular: Negative.  Negative for chest pain and palpitations.  Gastrointestinal: Negative for abdominal pain, diarrhea, nausea and vomiting.  Genitourinary: Positive for dysuria, frequency and hematuria.  Skin: Negative.   Neurological: Negative for dizziness and headaches.  All other systems reviewed and are negative.  Vitals:   10/07/18 1048  BP: 118/75  Pulse: 90  Resp: 18  Temp: (!) 97.5 F (36.4 C)  SpO2: 99%     Physical Exam Vitals signs reviewed.  Constitutional:      Appearance: Normal appearance.  HENT:     Head: Normocephalic and atraumatic.  Eyes:     Extraocular Movements: Extraocular movements intact.     Pupils: Pupils are equal, round, and reactive to light.  Cardiovascular:     Rate and Rhythm: Normal rate and regular rhythm.     Heart sounds: Normal heart sounds.  Pulmonary:     Effort: Pulmonary effort is normal.     Breath sounds: Normal breath sounds.  Abdominal:     Palpations: Abdomen is soft.     Tenderness: There is no  abdominal tenderness.  Musculoskeletal: Normal range of motion.  Skin:    General: Skin is warm and dry.     Capillary Refill: Capillary refill takes less than 2 seconds.  Neurological:     General: No focal deficit present.     Mental Status: She is alert and oriented to person, place, and time.  Psychiatric:        Mood and  Affect: Mood normal.        Behavior: Behavior normal.     Results for orders placed or performed in visit on 10/07/18 (from the past 24 hour(s))  POCT urinalysis dipstick     Status: Abnormal   Collection Time: 10/07/18 10:52 AM  Result Value Ref Range   Color, UA yellow yellow   Clarity, UA cloudy (A) clear   Glucose, UA =500 (A) negative mg/dL   Bilirubin, UA negative negative   Ketones, POC UA trace (5) (A) negative mg/dL   Spec Grav, UA 1.020 1.010 - 1.025   Blood, UA large (A) negative   pH, UA 5.5 5.0 - 8.0   Protein Ur, POC =30 (A) negative mg/dL   Urobilinogen, UA 0.2 0.2 or 1.0 E.U./dL   Nitrite, UA Negative Negative   Leukocytes, UA Small (1+) (A) Negative    ASSESSMENT & PLAN: Suhaylah was seen today for dysuria.  Diagnoses and all orders for this visit:  Dysuria -     POCT urinalysis dipstick -     Urine Culture  Acute UTI -     ciprofloxacin (CIPRO) 500 MG tablet; Take 1 tablet (500 mg total) by mouth 2 (two) times daily for 7 days.  Vaginal discomfort -     Ambulatory referral to Gynecology    Patient Instructions       If you have lab work done today you will be contacted with your lab results within the next 2 weeks.  If you have not heard from [place] then please contact us. The fastest way to get your results is to register for My Chart.   IF you received an x-ray today, you will receive an invoice from [facility]. Please contact [facility] at [phone] with questions or concerns regarding your invoice.   IF you received labwork today, you will receive an invoice from [facility]. Please contact [facility]  at [phone] with questions or concerns regarding your invoice.   Our billing staff will not be able to assist you with questions regarding bills from these companies.  You will be contacted with the lab results as soon as they are available. The fastest way to get your results is to activate your My Chart account. Instructions are located on the last page of this paperwork. If you have not heard from [place] regarding the results in 2 weeks, please contact this office.     Urinary Tract Infection, Adult A urinary tract infection (UTI) is an infection of any part of the urinary tract. The urinary tract includes:  The kidneys.  The ureters.  The bladder.  The urethra. These organs make, store, and get rid of pee (urine) in the body. What are the causes? This is caused by germs (bacteria) in your genital area. These germs grow and cause swelling (inflammation) of your urinary tract. What increases the risk? You are more likely to develop this condition if:  You have a small, thin tube (catheter) to drain pee.  You cannot control when you pee or poop (incontinence).  You are female, and: ? You use these methods to prevent pregnancy: ? A medicine that kills sperm (spermicide). ? A device that blocks sperm (diaphragm). ? You have low levels of a female hormone (estrogen). ? You are pregnant.  You have genes that add to your risk.  You are sexually active.  You take antibiotic medicines.  You have trouble peeing because of: ? A prostate that is bigger than normal, if you are  female. ? A blockage in the part of your body that drains pee from the bladder (urethra). ? A kidney stone. ? A nerve condition that affects your bladder (neurogenic bladder). ? Not getting enough to drink. ? Not peeing often enough.  You have other conditions, such as: ? Diabetes. ? A weak disease-fighting system (immune system). ? Sickle cell disease. ? Gout. ? Injury of the spine. What are the  signs or symptoms? Symptoms of this condition include:  Needing to pee right away (urgently).  Peeing often.  Peeing small amounts often.  Pain or burning when peeing.  Blood in the pee.  Pee that smells bad or not like normal.  Trouble peeing.  Pee that is cloudy.  Fluid coming from the vagina, if you are female.  Pain in the belly or lower back. Other symptoms include:  Throwing up (vomiting).  No urge to eat.  Feeling mixed up (confused).  Being tired and grouchy (irritable).  A fever.  Watery poop (diarrhea). How is this treated? This condition may be treated with:  Antibiotic medicine.  Other medicines.  Drinking enough water. Follow these instructions at home:  Medicines  Take over-the-counter and prescription medicines only as told by your doctor.  If you were prescribed an antibiotic medicine, take it as told by your doctor. Do not stop taking it even if you start to feel better. General instructions  Make sure you: ? Pee until your bladder is empty. ? Do not hold pee for a long time. ? Empty your bladder after sex. ? Wipe from front to back after pooping if you are a female. Use each tissue one time when you wipe.  Drink enough fluid to keep your pee pale yellow.  Keep all follow-up visits as told by your doctor. This is important. Contact a doctor if:  You do not get better after 1-2 days.  Your symptoms go away and then come back. Get help right away if:  You have very bad back pain.  You have very bad pain in your lower belly.  You have a fever.  You are sick to your stomach (nauseous).  You are throwing up. Summary  A urinary tract infection (UTI) is an infection of any part of the urinary tract.  This condition is caused by germs in your genital area.  There are many risk factors for a UTI. These include having a small, thin tube to drain pee and not being able to control when you pee or poop.  Treatment includes  antibiotic medicines for germs.  Drink enough fluid to keep your pee pale yellow. This information is not intended to replace advice given to you by your health care provider. Make sure you discuss any questions you have with your health care provider. Document Released: 01/03/2008 Document Revised: 01/24/2018 Document Reviewed: 01/24/2018 Elsevier Interactive Patient Education  2019 Elsevier Inc.      Agustina Caroli, MD [facility]

## 2018-10-08 LAB — URINE CULTURE

## 2018-10-24 IMAGING — CT CT CHEST LUNG CANCER SCREENING LOW DOSE W/O CM
2 of 3 series · 15 of 36 positions shown, 18 images · non-contrast
Comparison: None.

CLINICAL DATA: 66-year-old asymptomatic female current smoker with
32 pack-year smoking history.

EXAM:
CT CHEST WITHOUT CONTRAST LOW-DOSE FOR LUNG CANCER SCREENING
TECHNIQUE: Multidetector CT imaging of the chest was performed following the
standard protocol without IV contrast.

[Series 2: thorax 5.0 i31f 3 · axial · 0.64mm/px · z∈[-288,-38]mm · 12 of 60 slices shown, 15 images]
[im 5/60  mediastinal]
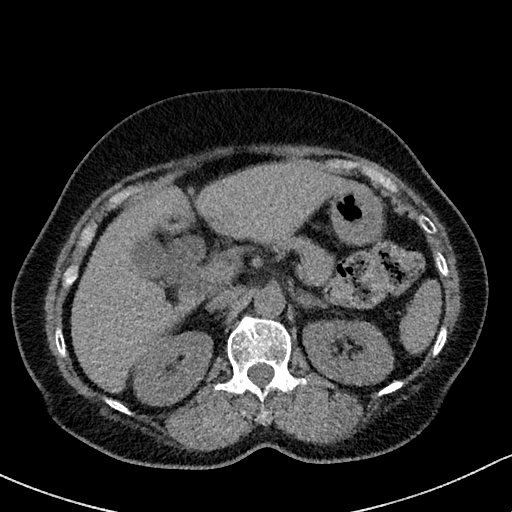
[im 5/60  lung]
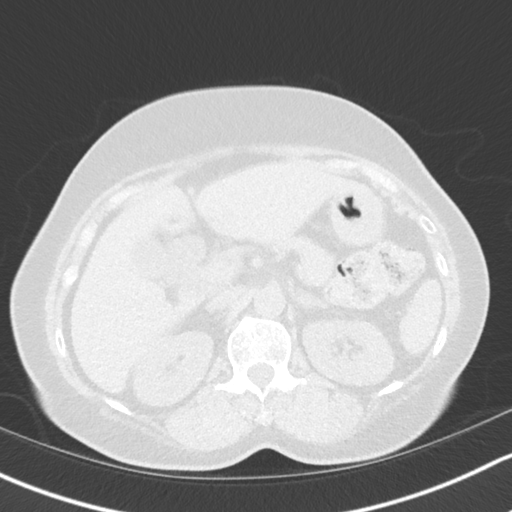
[im 9/60  lung]
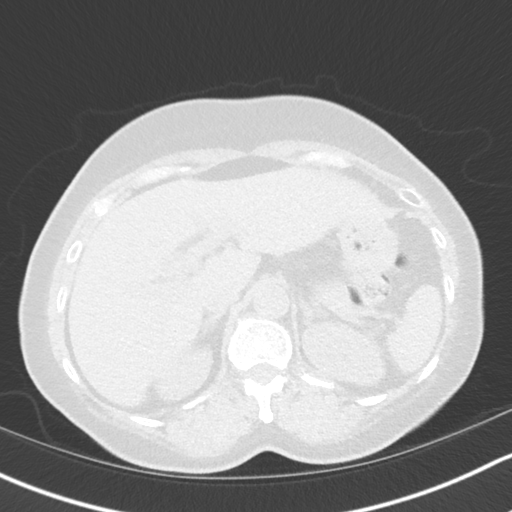
[im 14/60  lung]
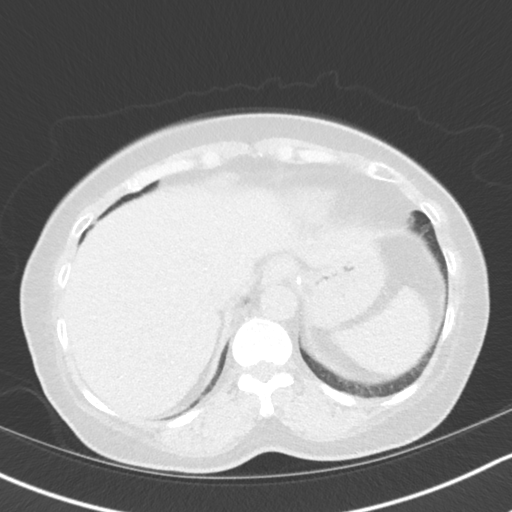
[im 18/60  lung]
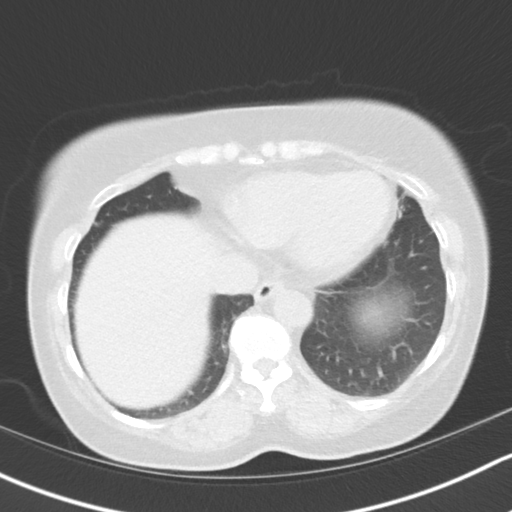
[im 22/60  mediastinal]
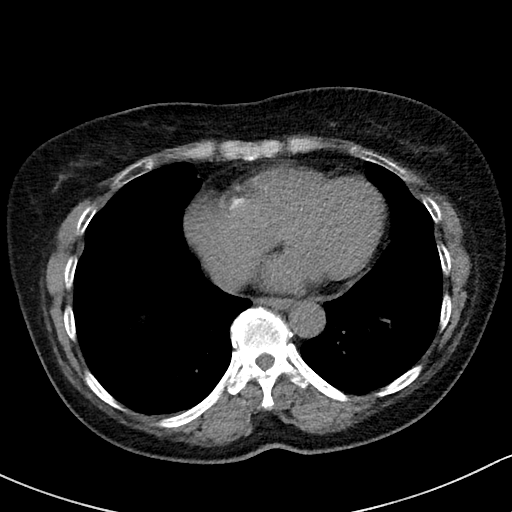
[im 22/60  lung]
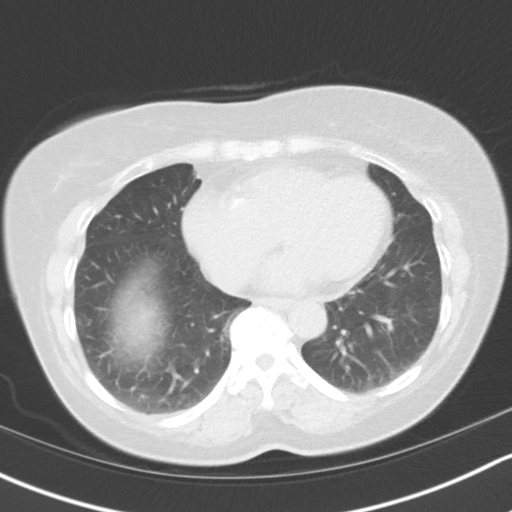
[im 27/60  lung]
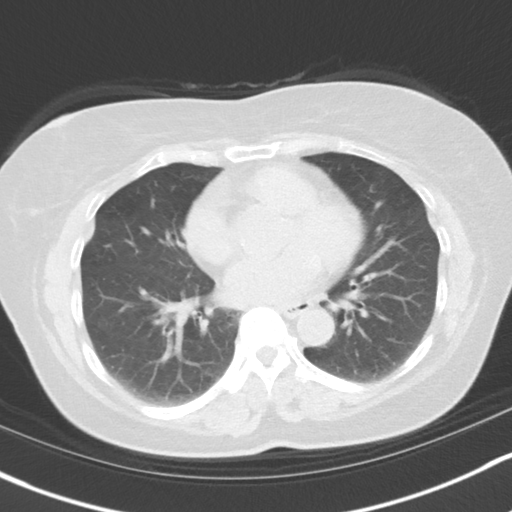
[im 33/60  lung]
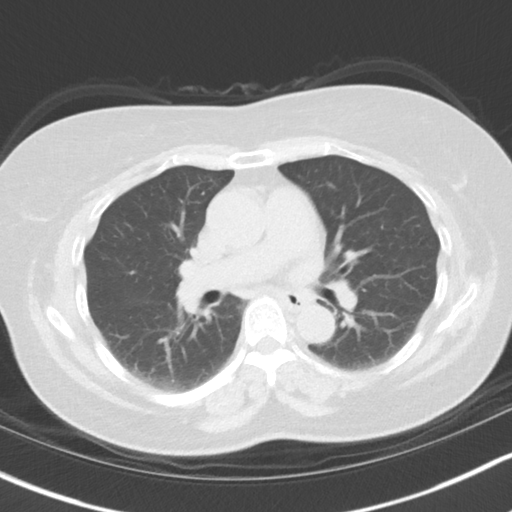
[im 38/60  lung]
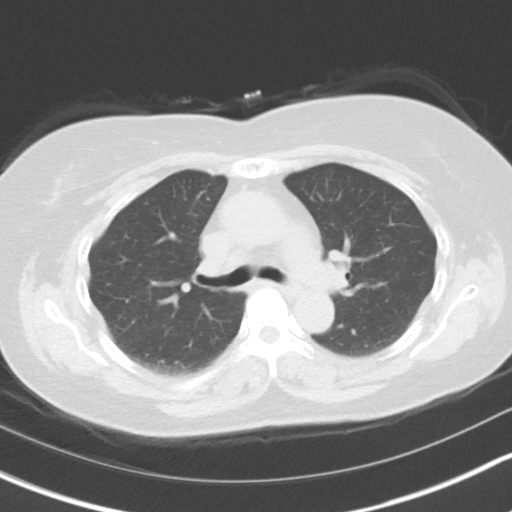
[im 42/60  mediastinal]
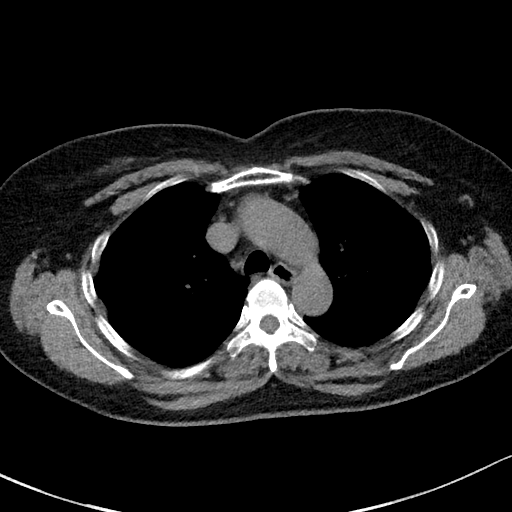
[im 42/60  lung]
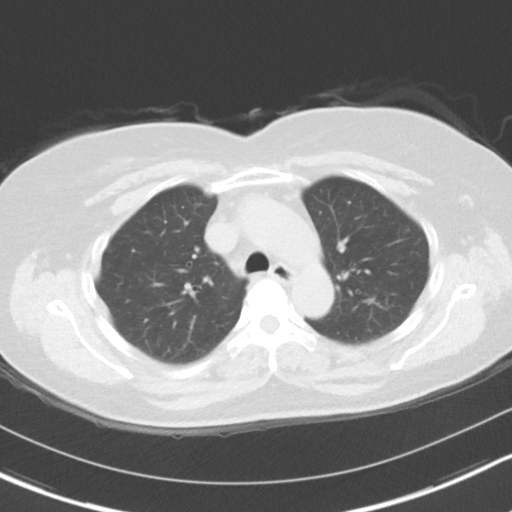
[im 46/60  lung]
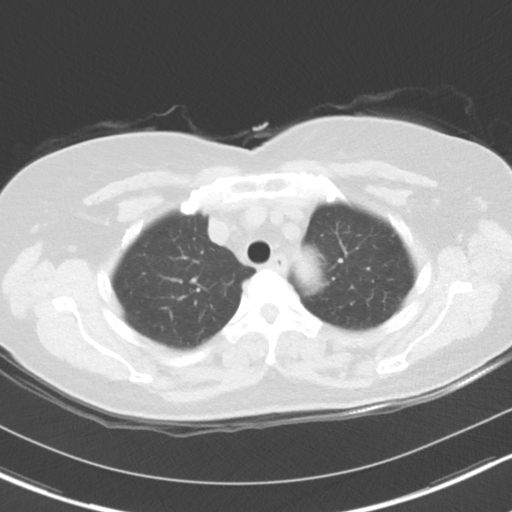
[im 51/60  lung]
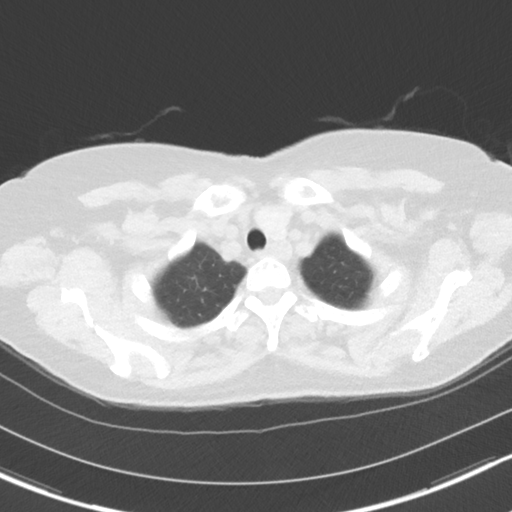
[im 55/60  lung]
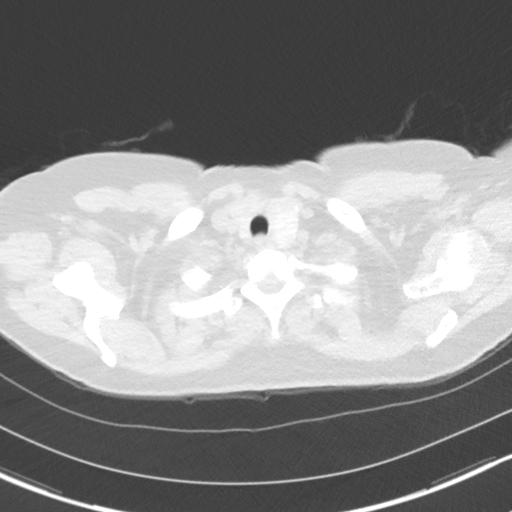

[Series 5: coronal · coronal · 0.57mm/px · 3 of 106 slices shown]
[im 22/106  lung]
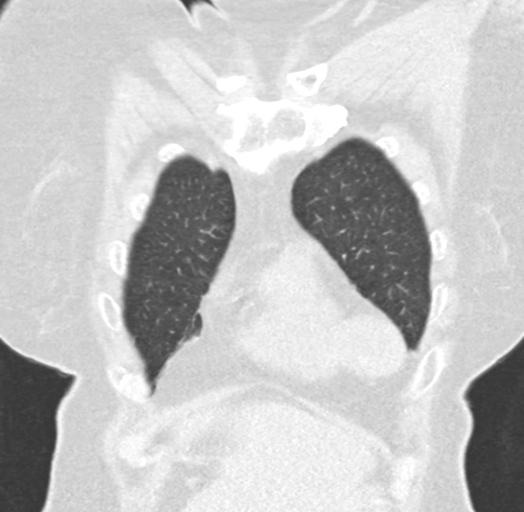
[im 43/106  lung]
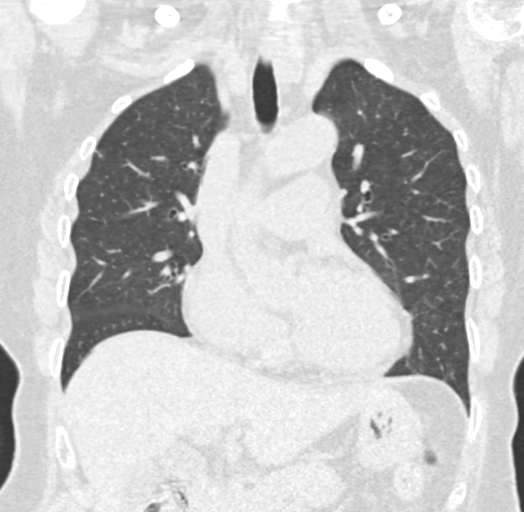
[im 64/106  lung]
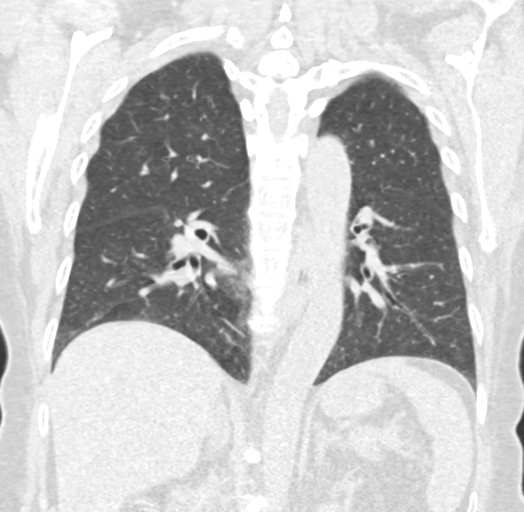

[15 of 36 positions shown; findings below may reference images not displayed]

FINDINGS: Cardiovascular: Normal heart size. No significant pericardial
effusion/thickening. Left anterior descending and right coronary
atherosclerosis. Great vessels are normal in course and caliber.

Mediastinum/Nodes: Partially calcified 1.4 cm anterior left thyroid
lobe nodule. Unremarkable esophagus. No pathologically enlarged
axillary, mediastinal or hilar lymph nodes, noting limited
sensitivity for the detection of hilar adenopathy on this
noncontrast study.

Lungs/Pleura: No pneumothorax. No pleural effusion. Mild
centrilobular emphysema with mild diffuse bronchial wall thickening.
No acute consolidative airspace disease or lung masses. A few
scattered tiny solid pulmonary nodules in both lungs, largest 3.1 mm
in volume derived mean diameter in the medial apical right upper
lobe (series 3/image 43).

Upper abdomen: No acute abnormality.

Musculoskeletal: No aggressive appearing focal osseous lesions. Mild
thoracic spondylosis.
IMPRESSION: 1. Lung-RADS 2, benign appearance or behavior. Continue annual
screening with low-dose chest CT without contrast in 12 months.
2. Two vessel coronary atherosclerosis.

Emphysema ([id number]-IFV.5).

## 2018-11-12 ENCOUNTER — Other Ambulatory Visit: Payer: Self-pay

## 2018-11-12 DIAGNOSIS — Z794 Long term (current) use of insulin: Principal | ICD-10-CM

## 2018-11-12 DIAGNOSIS — E1165 Type 2 diabetes mellitus with hyperglycemia: Secondary | ICD-10-CM

## 2018-11-15 ENCOUNTER — Encounter: Payer: Medicare Other | Admitting: Obstetrics & Gynecology

## 2018-11-18 ENCOUNTER — Ambulatory Visit: Payer: Medicare Other | Admitting: Family Medicine

## 2018-11-18 ENCOUNTER — Other Ambulatory Visit: Payer: Self-pay

## 2018-11-18 ENCOUNTER — Telehealth: Payer: Medicare Other | Admitting: Family Medicine

## 2018-11-25 ENCOUNTER — Other Ambulatory Visit: Payer: Self-pay

## 2018-11-25 ENCOUNTER — Telehealth (INDEPENDENT_AMBULATORY_CARE_PROVIDER_SITE_OTHER): Payer: Medicare Other | Admitting: Family Medicine

## 2018-11-25 DIAGNOSIS — Z794 Long term (current) use of insulin: Secondary | ICD-10-CM | POA: Diagnosis not present

## 2018-11-25 DIAGNOSIS — E782 Mixed hyperlipidemia: Secondary | ICD-10-CM

## 2018-11-25 DIAGNOSIS — E1165 Type 2 diabetes mellitus with hyperglycemia: Secondary | ICD-10-CM | POA: Diagnosis not present

## 2018-11-25 DIAGNOSIS — E0590 Thyrotoxicosis, unspecified without thyrotoxic crisis or storm: Secondary | ICD-10-CM

## 2018-11-25 DIAGNOSIS — E042 Nontoxic multinodular goiter: Secondary | ICD-10-CM

## 2018-11-25 MED ORDER — INSULIN LISPRO (1 UNIT DIAL) 100 UNIT/ML (KWIKPEN): 5.0000 [IU] | PEN_INJECTOR | Freq: Three times a day (TID) | SUBCUTANEOUS | 5 refills | Status: DC

## 2018-11-25 MED ORDER — METFORMIN HCL 1000 MG PO TABS: 1000.0000 mg | ORAL_TABLET | Freq: Two times a day (BID) | ORAL | 1 refills | Status: DC

## 2018-11-25 MED ORDER — INSULIN LISPRO (1 UNIT DIAL) 100 UNIT/ML (KWIKPEN): 5.0000 [IU] | PEN_INJECTOR | Freq: Three times a day (TID) | SUBCUTANEOUS | 99 refills | Status: DC

## 2018-11-25 MED ORDER — ATORVASTATIN CALCIUM 80 MG PO TABS: 80.0000 mg | ORAL_TABLET | Freq: Every day | ORAL | 1 refills | Status: DC

## 2018-11-25 NOTE — Progress Notes (Signed)
Virtual Visit Note  I connected with a patient on 11/25/18 at 1011am by telephone and verified that I am speaking with the correct person using two identifiers. Alyssa Weaver is currently located at home and patient is currently with them during visit. The provider, Rutherford Guys, MD is located in their office at time of visit.  I discussed the limitations, risks, security and privacy concerns of performing an evaluation and management service by telephone and the availability of in person appointments. I also discussed with the patient that there may be a patient responsible charge related to this service. The patient expressed understanding and agreed to proceed.   CC: routine followup  HPI   Previous PCP Dr Brigitte Pulse Last OV Dec 2019  67yo F with DM2, insulin dependent, HLP, sublclinical hyperthryoidism/multinodular thyroid and seasonal allergies ? [place] interpreter present via phone, [place] Her husband also helps provide part of history  Takes lantus 20 units at bedtime humalog 5 units AC Metformin 1072m BID fastings cbgs: 85 - 110, she denies any current/recent nocturnal hypoglycemia Before lunch cbgs: 110-120 Before dinner cbgs: 130-140 Bedtime cbgs: not checking  Denies any daytime hypoglycemia  Does not check BP at home  Dr SBrigitte Pulsehad referred her to endo for sublclinical hyperthyroidism, patient reports appt happened Benign thyroid FNA  BP Readings from Last 3 Encounters:  10/07/18 118/75  07/19/18 125/70  06/10/18 111/68   Lab Results  Component Value Date   HGBA1C 8.6 (A) 07/19/2018   HGBA1C 8.6 (H) 03/06/2018   HGBA1C 10.0 (H) 11/22/2017   Lab Results  Component Value Date   LDLCALC 55 11/22/2017   CREATININE 0.78 07/19/2018   Lab Results  Component Value Date   TSH 0.196 (L) 07/19/2018    No Known Allergies  Prior to Admission medications   Medication Sig Start Date End Date Taking? Authorizing Provider  atorvastatin (LIPITOR) 80 MG  tablet Take 1 tablet (80 mg total) by mouth daily. 07/19/18   SShawnee Knapp MD  Blood Glucose Monitoring Suppl ([facility]) w/Device KIT Twice daily sugar checks before meals 04/18/17   MMack Hook MD  Calcium Citrate-Vitamin D (CITRACAL/VITAMIN D) 250-200 MG-UNIT TABS 2 tabs by mouth twice daily 08/24/17   MMack Hook MD  cetirizine (ZYRTEC) 10 MG tablet Take 1 tablet (10 mg total) by mouth daily. 12/07/17   SShawnee Knapp MD  glucose blood (ONE TOUCH ULTRA TEST) test strip Use to check CBGs qam fasting, 2 hrs postprandial, & if having hyper or hypoglycemic sxs. Dx E11.65, Z79.4 05/07/18   SShawnee Knapp MD  ibuprofen (ADVIL,MOTRIN) 200 MG tablet Take 200 mg by mouth every 6 (six) hours as needed.    [provider]  Insulin Glargine (LANTUS SOLOSTAR) 100 UNIT/ML Solostar Pen Inject 20 Units into the skin every morning. 07/19/18   SShawnee Knapp MD  insulin lispro (HUMALOG KWIKPEN) 100 UNIT/ML KwikPen Inject 0.05 mLs (5 Units total) into the skin 3 (three) times daily before meals. Increase dose to 0.163m(10u) if cbg >200 before a meal. 07/19/18   ShShawnee KnappMD  levofloxacin (LEVAQUIN) 750 MG tablet Take 1 tablet (750 mg total) by mouth daily. 07/31/18   ShShawnee KnappMD  metFORMIN (GLUCOPHAGE) 1000 MG tablet Take 1 tablet (1,000 mg total) by mouth 2 (two) times daily with a meal. 07/19/18   ShShawnee KnappMD  mometasone (NASONEX) 50 MCG/ACT nasal spray 2 sprays each nostril daily 07/19/18   ShShawnee Knapp  MD    Past Medical History:  Diagnosis Date  . Diabetes mellitus without complication ([place])   . Hyperlipidemia     Past Surgical History:  Procedure Laterality Date  . TUBAL LIGATION  1985    Social History   Tobacco Use  . Smoking status: Current Every Day Smoker    Packs/day: 0.75    Years: 43.00    Pack years: 32.25    Types: Cigarettes  . Smokeless tobacco: Never Used  . Tobacco comment: Husband smokes 1 ppd.  He might be willing to quit at same time.   Substance Use Topics  . Alcohol use: No    Family History  Problem Relation Age of Onset  . Asthma Brother     ROS Neg f/c/cough/cob/cp/palpitation/edema  Objective  Vitals as reported by the patient: per above   ASSESSMENT and PLAN  1. Type 2 diabetes mellitus with hyperglycemia, with long-term current use of insulin ([place]) Per reported cbgs, at goal. Cont current regime, labs per below - TSH; Future - CMP14+EGFR; Future - metFORMIN (GLUCOPHAGE) 1000 MG tablet; Take 1 tablet (1,000 mg total) by mouth 2 (two) times daily with a meal. - Hemoglobin A1c; Future - insulin lispro (HUMALOG KWIKPEN) 100 UNIT/ML KwikPen; Inject 0.05 mLs (5 Units total) into the skin 3 (three) times daily before meals. Increase dose to 0.61m (10u) if cbg >200 before a meal.  2. Mixed hyperlipidemia Checking labs, medications will be adjusted as needed.  - atorvastatin (LIPITOR) 80 MG tablet; Take 1 tablet (80 mg total) by mouth daily. - Lipid panel; Future  3. Subclinical hyperthyroidism 4. Multinodular thyroid eval by endo, routine surveillance per patient. - TSH; Future - Free T4, Future   FOLLOW-UP: fasting labs within 1 week, followup with me in 3 months   The above assessment and management plan was discussed with the patient. The patient verbalized understanding of and has agreed to the management plan. Patient is aware to call the clinic if symptoms persist or worsen. Patient is aware when to return to the clinic for a follow-up visit. Patient educated on when it is appropriate to go to the emergency department.    I provided 29 minutes of non-face-to-face time during this encounter.  IRutherford Guys MD [facility] [place] [address]Ph.  [phone] [phone]

## 2018-11-25 NOTE — Progress Notes (Signed)
Follow up on diabetes. Needing refills on the medication pended. No medical problems at this time. Pt has not traveled or has issue with anxiety or depression.

## 2018-12-02 NOTE — Telephone Encounter (Signed)
See note

## 2018-12-02 NOTE — Telephone Encounter (Signed)
See phone note

## 2019-01-21 ENCOUNTER — Other Ambulatory Visit: Payer: Self-pay | Admitting: *Deleted

## 2019-01-21 DIAGNOSIS — Z122 Encounter for screening for malignant neoplasm of respiratory organs: Secondary | ICD-10-CM

## 2019-01-21 DIAGNOSIS — Z87891 Personal history of nicotine dependence: Secondary | ICD-10-CM

## 2019-01-21 DIAGNOSIS — F17210 Nicotine dependence, cigarettes, uncomplicated: Secondary | ICD-10-CM

## 2019-01-27 ENCOUNTER — Telehealth: Payer: Self-pay | Admitting: Family Medicine

## 2019-01-27 ENCOUNTER — Other Ambulatory Visit: Payer: Self-pay | Admitting: *Deleted

## 2019-01-27 MED ORDER — CETIRIZINE HCL 10 MG PO TABS: 10.0000 mg | ORAL_TABLET | Freq: Every day | ORAL | 0 refills | Status: DC

## 2019-01-27 NOTE — Telephone Encounter (Signed)
Patient states that they need a refill on her allergy medication until her appnt in July

## 2019-01-28 ENCOUNTER — Ambulatory Visit: Payer: Medicare Other | Admitting: Family Medicine

## 2019-02-18 ENCOUNTER — Ambulatory Visit (INDEPENDENT_AMBULATORY_CARE_PROVIDER_SITE_OTHER): Payer: Medicare Other | Admitting: Family Medicine

## 2019-02-18 ENCOUNTER — Other Ambulatory Visit: Payer: Self-pay

## 2019-02-18 ENCOUNTER — Encounter: Payer: Self-pay | Admitting: Family Medicine

## 2019-02-18 VITALS — BP 128/80 | HR 75 | Temp 98.2°F | Ht 63.0 in | Wt 140.0 lb

## 2019-02-18 DIAGNOSIS — Z23 Encounter for immunization: Secondary | ICD-10-CM

## 2019-02-18 DIAGNOSIS — Z1211 Encounter for screening for malignant neoplasm of colon: Secondary | ICD-10-CM

## 2019-02-18 DIAGNOSIS — E0590 Thyrotoxicosis, unspecified without thyrotoxic crisis or storm: Secondary | ICD-10-CM | POA: Diagnosis not present

## 2019-02-18 DIAGNOSIS — Z794 Long term (current) use of insulin: Secondary | ICD-10-CM

## 2019-02-18 DIAGNOSIS — E782 Mixed hyperlipidemia: Secondary | ICD-10-CM | POA: Diagnosis not present

## 2019-02-18 DIAGNOSIS — Z780 Asymptomatic menopausal state: Secondary | ICD-10-CM

## 2019-02-18 DIAGNOSIS — E1165 Type 2 diabetes mellitus with hyperglycemia: Secondary | ICD-10-CM | POA: Diagnosis not present

## 2019-02-18 NOTE — Progress Notes (Signed)
7/21/202011:58 AM  Alyssa Weaver 1951/06/12, 68 y.o., female [id number]  Chief Complaint  Patient presents with  . Diabetes    hs lab result a1c is 8.9  . Hyperlipidemia  . Medication Refill    omeprazole 73m    HPI:   Patient is a 68y.o. female with past medical history significant for DM2 insulin dependent, HLP, subclinical hypothyroidism/multinodular thyroid, seasonal allergies who presents today for routine followup  Last OV April 2020 - telemedicine [place] after recurring technical difficulties with interpreter services  Has never seen endo Had benign thyroid bx in nov 2019  lantus 20units before breakfast humalog uses if cbg > 150 will do 5 units Also takes metformin 10046mBID cbg log, fasting, before lunch and dinner: 10 day avg: 130, 132, 137 Reports rare nocturnal hypoglycemia Hardly ever uses humalog Last eye exam 2 years ago, [facility]  Lung cancer screening via pulm  Lab Results  Component Value Date   HGBA1C 8.6 (A) 07/19/2018   HGBA1C 8.6 (H) 03/06/2018   HGBA1C 10.0 (H) 11/22/2017   Lab Results  Component Value Date   LDLCALC 55 11/22/2017   CREATININE 0.78 07/19/2018   Lab Results  Component Value Date   TSH 0.196 (L) 07/19/2018    Depression screen PHQ 2/9 11/25/2018 10/07/2018 07/19/2018  Decreased Interest 0 0 0  Down, Depressed, Hopeless 0 0 0  PHQ - 2 Score 0 0 0  Altered sleeping - - -  Tired, decreased energy - - -  Change in appetite - - -  Feeling bad or failure about yourself  - - -  Trouble concentrating - - -  Moving slowly or fidgety/restless - - -  Suicidal thoughts - - -  PHQ-9 Score - - -    Fall Risk  11/25/2018 10/07/2018 07/19/2018 06/10/2018 01/07/2018  Falls in the past year? 0 0 0 0 No  Number falls in past yr: 0 - - - -  Injury with Fall? 0 - - - -  Follow up - Falls evaluation completed - - -     No Known Allergies  Prior to Admission medications   Medication  Sig Start Date End Date Taking? Authorizing Provider  atorvastatin (LIPITOR) 80 MG tablet Take 1 tablet (80 mg total) by mouth daily. 11/25/18   SaRutherford GuysMD  Blood Glucose Monitoring Suppl ([facility]) w/Device KIT Twice daily sugar checks before meals 04/18/17   MuMack HookMD  Calcium Citrate-Vitamin D (CITRACAL/VITAMIN D) 250-200 MG-UNIT TABS 2 tabs by mouth twice daily 08/24/17   MuMack HookMD  cetirizine (ZYRTEC) 10 MG tablet Take 1 tablet (10 mg total) by mouth daily. 01/27/19   SaRutherford GuysMD  glucose blood (ONE TOUCH ULTRA TEST) test strip Use to check CBGs qam fasting, 2 hrs postprandial, & if having hyper or hypoglycemic sxs. Dx E11.65, Z79.4 05/07/18   ShShawnee KnappMD  ibuprofen (ADVIL,MOTRIN) 200 MG tablet Take 200 mg by mouth every 6 (six) hours as needed.    [provider]  Insulin Glargine (LANTUS SOLOSTAR) 100 UNIT/ML Solostar Pen Inject 20 Units into the skin every morning. 07/19/18   ShShawnee KnappMD  insulin lispro (HUMALOG KWIKPEN) 100 UNIT/ML KwikPen Inject 0.05 mLs (5 Units total) into the skin 3 (three) times daily before meals. Increase dose to 0.74m58m10u) if cbg >200 before a meal. 11/25/18   SanRutherford GuysD  metFORMIN (GLUCOPHAGE) 1000 MG tablet Take 1  tablet (1,000 mg total) by mouth 2 (two) times daily with a meal. 11/25/18   Rutherford Guys, MD  mometasone (NASONEX) 50 MCG/ACT nasal spray 2 sprays each nostril daily 07/19/18   Shawnee Knapp, MD    Past Medical History:  Diagnosis Date  . Diabetes mellitus without complication ([place])   . Hyperlipidemia     Past Surgical History:  Procedure Laterality Date  . TUBAL LIGATION  1985    Social History   Tobacco Use  . Smoking status: Current Every Day Smoker    Packs/day: 0.75    Years: 43.00    Pack years: 32.25    Types: Cigarettes  . Smokeless tobacco: Never Used  . Tobacco comment: Husband smokes 1 ppd.  He might be willing to quit at same time.  Substance Use  Topics  . Alcohol use: No    Family History  Problem Relation Age of Onset  . Asthma Brother     Review of Systems  Constitutional: Negative for chills and fever.  Respiratory: Negative for cough and shortness of breath.   Cardiovascular: Negative for chest pain, palpitations and leg swelling.  Gastrointestinal: Negative for abdominal pain, nausea and vomiting.     OBJECTIVE:  Today's Vitals   02/18/19 1138  BP: 128/80  Pulse: 75  Temp: 98.2 F (36.8 C)  TempSrc: Oral  SpO2: 98%  Weight: 140 lb (63.5 kg)  Height: '5\' 3"'  (1.6 m)   Body mass index is 24.8 kg/m.   Physical Exam Vitals signs and nursing note reviewed.  Constitutional:      Appearance: She is well-developed.  HENT:     Head: Normocephalic and atraumatic.     Mouth/Throat:     Pharynx: No oropharyngeal exudate.  Eyes:     General: No scleral icterus.    Conjunctiva/sclera: Conjunctivae normal.     Pupils: Pupils are equal, round, and reactive to light.  Neck:     Musculoskeletal: Neck supple.  Cardiovascular:     Rate and Rhythm: Normal rate and regular rhythm.     Heart sounds: Normal heart sounds. No murmur. No friction rub. No gallop.   Pulmonary:     Effort: Pulmonary effort is normal.     Breath sounds: Normal breath sounds. No wheezing or rales.  Skin:    General: Skin is warm and dry.  Neurological:     Mental Status: She is alert and oriented to person, place, and time.     ASSESSMENT and PLAN  1. Type 2 diabetes mellitus with hyperglycemia, with long-term current use of insulin ([place]) Checking labs today, medications will be adjusted as needed. Consider d/c humalog in favor of trulicity - QHK25+JDYN - TSH - Ambulatory referral to Ophthalmology  2. Mixed hyperlipidemia Controlled. Continue current regime.  - Lipid panel  3. Subclinical hyperthyroidism Checking labs. Consider endo referral - T4, Free - TSH  4. Colon cancer screening - Cologuard  5. Need for vaccination  - Pneumococcal polysaccharide vaccine 23-valent greater than or equal to 2yo subcutaneous/IM  6. Postmenopausal estrogen deficiency - DG Bone Density; Future  Return in about 3 months (around 05/21/2019).    Rutherford Guys, MD Primary Care at [place], [place] [address] Ph.  [phone] Fax [phone]

## 2019-02-19 LAB — CMP14+EGFR
ALT: 18 IU/L (ref 0–32)
AST: 21 IU/L (ref 0–40)
Albumin/Globulin Ratio: 2 (ref 1.2–2.2)
Albumin: 4.6 g/dL (ref 3.8–4.8)
Alkaline Phosphatase: 62 IU/L (ref 39–117)
BUN/Creatinine Ratio: 17 (ref 12–28)
BUN: 12 mg/dL (ref 8–27)
Bilirubin Total: <0.2 mg/dL (ref 0.0–1.2)
CO2: 21 mmol/L (ref 20–29)
Calcium: 9.6 mg/dL (ref 8.7–10.3)
Chloride: 105 mmol/L (ref 96–106)
Creatinine, Ser: 0.7 mg/dL (ref 0.57–1.00)
GFR calc Af Amer: 104 mL/min/{1.73_m2} (ref >59)
GFR calc non Af Amer: 91 mL/min/{1.73_m2} (ref >59)
Globulin, Total: 2.3 g/dL (ref 1.5–4.5)
Glucose: 75 mg/dL (ref 65–99)
Potassium: 4 mmol/L (ref 3.5–5.2)
Sodium: 146 mmol/L — ABNORMAL HIGH (ref 134–144)
Total Protein: 6.9 g/dL (ref 6.0–8.5)

## 2019-02-19 LAB — LIPID PANEL
Chol/HDL Ratio: 2.6 ratio (ref 0.0–4.4)
Cholesterol, Total: 106 mg/dL (ref 100–199)
HDL: 41 mg/dL (ref >39)
LDL Calculated: 41 mg/dL (ref 0–99)
Triglycerides: 119 mg/dL (ref 0–149)
VLDL Cholesterol Cal: 24 mg/dL (ref 5–40)

## 2019-02-19 LAB — T4, FREE: Free T4: 1.28 ng/dL (ref 0.82–1.77)

## 2019-02-19 LAB — TSH: TSH: 0.273 u[IU]/mL — ABNORMAL LOW (ref 0.450–4.500)

## 2019-02-24 ENCOUNTER — Ambulatory Visit: Payer: Medicare Other | Admitting: Family Medicine

## 2019-02-25 LAB — HM DIABETES EYE EXAM

## 2019-03-09 ENCOUNTER — Other Ambulatory Visit: Payer: Self-pay | Admitting: Family Medicine

## 2019-03-09 NOTE — Telephone Encounter (Signed)
Requested Prescriptions  Pending Prescriptions Disp Refills  . cetirizine (ZYRTEC) 10 MG tablet [Pharmacy Med Name: Cetirizine HCl 10 MG Oral Tablet] 30 tablet 0    Sig: Take 1 tablet by mouth once daily     Ear, Nose, and Throat:  Antihistamines Passed - 03/09/2019  4:08 PM      Passed - Valid encounter within last 12 months    Recent Outpatient Visits          2 weeks ago Type 2 diabetes mellitus with hyperglycemia, with long-term current use of insulin [facility])   Primary Care at [facility], Lilia Argue, MD   5 months ago Dysuria   Primary Care at [place], [place], MD   7 months ago Type 2 diabetes mellitus with hyperglycemia, with long-term current use of insulin [facility])   Primary Care at [facility], Laurey Arrow, MD   9 months ago Acute maxillary sinusitis, recurrence not specified   Primary Care at [facility], Reather Laurence, PA-C   1 year ago Microcytosis   Primary Care at [facility], Laurey Arrow, MD      Future Appointments            In 2 months Rutherford Guys, MD Primary Care at [place], [facility]

## 2019-03-10 ENCOUNTER — Ambulatory Visit (INDEPENDENT_AMBULATORY_CARE_PROVIDER_SITE_OTHER)
Admission: RE | Admit: 2019-03-10 | Discharge: 2019-03-10 | Disposition: A | Discharge status: Home / Self Care | Payer: Medicare Other | Source: Ambulatory Visit | Attending: Acute Care | Admitting: Acute Care

## 2019-03-10 ENCOUNTER — Other Ambulatory Visit: Payer: Self-pay

## 2019-03-10 DIAGNOSIS — F17210 Nicotine dependence, cigarettes, uncomplicated: Secondary | ICD-10-CM

## 2019-03-10 DIAGNOSIS — Z122 Encounter for screening for malignant neoplasm of respiratory organs: Secondary | ICD-10-CM

## 2019-03-10 DIAGNOSIS — Z87891 Personal history of nicotine dependence: Secondary | ICD-10-CM

## 2019-03-12 ENCOUNTER — Other Ambulatory Visit: Payer: Self-pay | Admitting: *Deleted

## 2019-03-12 DIAGNOSIS — F17210 Nicotine dependence, cigarettes, uncomplicated: Secondary | ICD-10-CM

## 2019-03-12 DIAGNOSIS — Z87891 Personal history of nicotine dependence: Secondary | ICD-10-CM

## 2019-03-12 DIAGNOSIS — Z122 Encounter for screening for malignant neoplasm of respiratory organs: Secondary | ICD-10-CM

## 2019-05-13 ENCOUNTER — Other Ambulatory Visit: Payer: Self-pay

## 2019-05-13 ENCOUNTER — Ambulatory Visit
Admission: RE | Admit: 2019-05-13 | Discharge: 2019-05-13 | Disposition: A | Discharge status: Home / Self Care | Payer: Medicare Other | Source: Ambulatory Visit | Attending: Family Medicine | Admitting: Family Medicine

## 2019-05-13 DIAGNOSIS — Z780 Asymptomatic menopausal state: Secondary | ICD-10-CM

## 2019-05-20 ENCOUNTER — Ambulatory Visit (INDEPENDENT_AMBULATORY_CARE_PROVIDER_SITE_OTHER): Payer: Medicare Other | Admitting: Family Medicine

## 2019-05-20 ENCOUNTER — Ambulatory Visit: Payer: Medicare Other | Admitting: Family Medicine

## 2019-05-20 ENCOUNTER — Encounter: Payer: Self-pay | Admitting: Family Medicine

## 2019-05-20 ENCOUNTER — Other Ambulatory Visit: Payer: Self-pay

## 2019-05-20 VITALS — BP 124/75 | HR 82 | Temp 98.2°F | Ht 63.0 in | Wt 143.6 lb

## 2019-05-20 DIAGNOSIS — M810 Age-related osteoporosis without current pathological fracture: Secondary | ICD-10-CM | POA: Diagnosis not present

## 2019-05-20 DIAGNOSIS — Z794 Long term (current) use of insulin: Secondary | ICD-10-CM

## 2019-05-20 DIAGNOSIS — E782 Mixed hyperlipidemia: Secondary | ICD-10-CM

## 2019-05-20 DIAGNOSIS — E1165 Type 2 diabetes mellitus with hyperglycemia: Secondary | ICD-10-CM | POA: Diagnosis not present

## 2019-05-20 DIAGNOSIS — Z1211 Encounter for screening for malignant neoplasm of colon: Secondary | ICD-10-CM

## 2019-05-20 DIAGNOSIS — E0590 Thyrotoxicosis, unspecified without thyrotoxic crisis or storm: Secondary | ICD-10-CM

## 2019-05-20 DIAGNOSIS — E042 Nontoxic multinodular goiter: Secondary | ICD-10-CM | POA: Insufficient documentation

## 2019-05-20 LAB — POCT GLYCOSYLATED HEMOGLOBIN (HGB A1C): Hemoglobin A1C: 8.6 % — AB (ref 4.0–5.6)

## 2019-05-20 MED ORDER — TRULICITY 0.75 MG/0.5ML ~~LOC~~ SOAJ: 0.7500 mg | SUBCUTANEOUS | 5 refills | Status: DC

## 2019-05-20 MED ORDER — ALENDRONATE SODIUM 70 MG PO TABS: 70.0000 mg | ORAL_TABLET | ORAL | 11 refills | Status: DC

## 2019-05-20 NOTE — Patient Instructions (Addendum)
Calcium 600 mg twice a day Vitamin D3 2000 units a day Alendronate 70mg  once a week Walk for 30 minutes every day   If you have lab work done today you will be contacted with your lab results within the next 2 weeks.  If you have not heard from [place] then please contact us. The fastest way to get your results is to register for My Chart.   IF you received an x-ray today, you will receive an invoice from [facility]. Please contact [facility] at [phone] with questions or concerns regarding your invoice.   IF you received labwork today, you will receive an invoice from [place]. Please contact [facility] at [phone] with questions or concerns regarding your invoice.   Our billing staff will not be able to assist you with questions regarding bills from these companies.  You will be contacted with the lab results as soon as they are available. The fastest way to get your results is to activate your My Chart account. Instructions are located on the last page of this paperwork. If you have not heard from [place] regarding the results in 2 weeks, please contact this office.

## 2019-05-20 NOTE — Progress Notes (Signed)
10/20/20201:39 PM  Alyssa Weaver [birth year], 68 y.o., female [id number]  Chief Complaint  Patient presents with  . Diabetes    HPI:   Patient is a 68 y.o. female with past medical history significant for DM2 insulin dependent, HLP, subclinical hypothyroidism/multinodular thyroid, seasonal allergies who presents today for routine followup   Last OV July 2020 - no changes, A1c was not done for unclear reasons  [place]ian interpreter used  lantus 20 units daily Metformin 1047m BID humalog if cbgs > 150 Hardly ever uses humalog Denies any lows fastings ~ 130s  Lab Results  Component Value Date   TSH 0.273 (L) 02/18/2019  FT4 1.28  Lab Results  Component Value Date   HGBA1C 8.6 (A) 07/19/2018   Lab Results  Component Value Date   CREATININE 0.70 02/18/2019   BUN 12 02/18/2019   NA 146 (H) 02/18/2019   K 4.0 02/18/2019   CL 105 02/18/2019   CO2 21 02/18/2019   Lab Results  Component Value Date   CHOL 106 02/18/2019   HDL 41 02/18/2019   LDLCALC 41 02/18/2019   TRIG 119 02/18/2019   CHOLHDL 2.6 02/18/2019   dexa - osteoporosis, T score -2.5 spine Smoker - not willing to quit She does not take calcium or D3 She walks twice a day  Depression screen [facility]9 05/20/2019 11/25/2018 10/07/2018  Decreased Interest 0 0 0  Down, Depressed, Hopeless 0 0 0  PHQ - 2 Score 0 0 0  Altered sleeping - - -  Tired, decreased energy - - -  Change in appetite - - -  Feeling bad or failure about yourself  - - -  Trouble concentrating - - -  Moving slowly or fidgety/restless - - -  Suicidal thoughts - - -  PHQ-9 Score - - -    Fall Risk  05/20/2019 11/25/2018 10/07/2018 07/19/2018 06/10/2018  Falls in the past year? 0 0 0 0 0  Number falls in past yr: 0 0 - - -  Injury with Fall? 0 0 - - -  Follow up - - Falls evaluation completed - -     No Known Allergies  Prior to Admission medications   Medication Sig Start Date End Date Taking? Authorizing Provider  atorvastatin  (LIPITOR) 80 MG tablet Take 1 tablet (80 mg total) by mouth daily. 11/25/18  Yes SRutherford Guys MD  Blood Glucose Monitoring Suppl ([facility]) w/Device KIT Twice daily sugar checks before meals 04/18/17  Yes MMack Hook MD  Calcium Citrate-Vitamin D (CITRACAL/VITAMIN D) 250-200 MG-UNIT TABS 2 tabs by mouth twice daily 08/24/17  Yes MMack Hook MD  cetirizine (ZYRTEC) 10 MG tablet Take 1 tablet by mouth once daily 03/09/19  Yes SPamella Pert Chirstopher Iovino M, MD  glucose blood (ONE TOUCH ULTRA TEST) test strip Use to check CBGs qam fasting, 2 hrs postprandial, & if having hyper or hypoglycemic sxs. Dx E11.65, Z79.4 05/07/18  Yes SShawnee Knapp MD  ibuprofen (ADVIL,MOTRIN) 200 MG tablet Take 200 mg by mouth every 6 (six) hours as needed.   Yes [provider]  Insulin Glargine (LANTUS SOLOSTAR) 100 UNIT/ML Solostar Pen Inject 20 Units into the skin every morning. 07/19/18  Yes SShawnee Knapp MD  insulin lispro (HUMALOG KWIKPEN) 100 UNIT/ML KwikPen Inject 0.05 mLs (5 Units total) into the skin 3 (three) times daily before meals. Increase dose to 0.145m(10u) if cbg >200 before a meal. 11/25/18  Yes SaRutherford GuysMD  latanoprost (XALATAN) 0.005 % ophthalmic  solution INSTILL 1 DROP INTO EACH EYE ONCE DAILY IN THE EVENING 04/30/19  Yes [provider]  metFORMIN (GLUCOPHAGE) 1000 MG tablet Take 1 tablet (1,000 mg total) by mouth 2 (two) times daily with a meal. 11/25/18  Yes Rutherford Guys, MD  mometasone (NASONEX) 50 MCG/ACT nasal spray 2 sprays each nostril daily 07/19/18  Yes Shawnee Knapp, MD    Past Medical History:  Diagnosis Date  . Diabetes mellitus without complication ([place])   . Hyperlipidemia     Past Surgical History:  Procedure Laterality Date  . TUBAL LIGATION  1985    Social History   Tobacco Use  . Smoking status: Current Every Day Smoker    Packs/day: 0.75    Years: 43.00    Pack years: 32.25    Types: Cigarettes  . Smokeless tobacco: Never Used  .  Tobacco comment: Husband smokes 1 ppd.  He might be willing to quit at same time.  Substance Use Topics  . Alcohol use: No    Family History  Problem Relation Age of Onset  . Asthma Brother     Review of Systems  Constitutional: Negative for chills and fever.  Respiratory: Negative for cough and shortness of breath.   Cardiovascular: Negative for chest pain, palpitations and leg swelling.  Gastrointestinal: Negative for abdominal pain, nausea and vomiting.     OBJECTIVE:  Today's Vitals   05/20/19 1332  BP: 124/75  Pulse: 82  Temp: 98.2 F (36.8 C)  SpO2: 98%  Weight: 143 lb 9.6 oz (65.1 kg)  Height: '5\' 3"'  (1.6 m)   Body mass index is 25.44 kg/m.   Physical Exam Vitals signs and nursing note reviewed.  Constitutional:      Appearance: She is well-developed.  HENT:     Head: Normocephalic and atraumatic.     Mouth/Throat:     Pharynx: No oropharyngeal exudate.  Eyes:     General: No scleral icterus.    Conjunctiva/sclera: Conjunctivae normal.     Pupils: Pupils are equal, round, and reactive to light.  Neck:     Musculoskeletal: Neck supple.  Cardiovascular:     Rate and Rhythm: Normal rate and regular rhythm.     Heart sounds: Normal heart sounds. No murmur. No friction rub. No gallop.   Pulmonary:     Effort: Pulmonary effort is normal.     Breath sounds: Normal breath sounds. No wheezing or rales.  Skin:    General: Skin is warm and dry.  Neurological:     Mental Status: She is alert and oriented to person, place, and time.     Results for orders placed or performed in visit on 05/20/19 (from the past 24 hour(s))  POCT glycosylated hemoglobin (Hb A1C)     Status: Abnormal   Collection Time: 05/20/19  1:57 PM  Result Value Ref Range   Hemoglobin A1C 8.6 (A) 4.0 - 5.6 %   HbA1c POC (<> result, manual entry)     HbA1c, POC (prediabetic range)     HbA1c, POC (controlled diabetic range)      No results found.   ASSESSMENT and PLAN  1. Type 2  diabetes mellitus with hyperglycemia, with long-term current use of insulin ([facility]) Not controlled. Adding trulicity, reviewed r/se/b. cont lantus and metformin.  - POCT glycosylated hemoglobin (Hb A1C)  2. Mixed hyperlipidemia Controlled. Continue current regime.   3. Subclinical hyperthyroidism - Ambulatory referral to Endocrinology  4. Age-related osteoporosis without current pathological fracture New diagnosis.  Starting alendronate. Reviewed r/se/b. Discussed OTC ca/d3 supplement. Cont walking. Discussed smoking contribution. - Vitamin D, 25-hydroxy - Ambulatory referral to Endocrinology  5. Colon cancer screening - Ambulatory referral to Gastroenterology  Other orders - alendronate (FOSAMAX) 70 MG tablet; Take 1 tablet (70 mg total) by mouth every 7 (seven) days. Take with a full glass of water on an empty stomach. - Dulaglutide (TRULICITY) 1.38 IT/1.9LV SOPN; Inject 0.75 mg into the skin once a week.  Return in about 3 months (around 08/20/2019).    Rutherford Guys, MD Primary Care at [place], [place] [address] Ph.  [phone] Fax [phone]

## 2019-05-21 LAB — VITAMIN D 25 HYDROXY (VIT D DEFICIENCY, FRACTURES): Vit D, 25-Hydroxy: 39.9 ng/mL (ref 30.0–100.0)

## 2019-05-29 ENCOUNTER — Other Ambulatory Visit: Payer: Self-pay

## 2019-06-02 ENCOUNTER — Other Ambulatory Visit: Payer: Self-pay

## 2019-06-02 ENCOUNTER — Encounter: Payer: Self-pay | Admitting: Endocrinology

## 2019-06-02 ENCOUNTER — Ambulatory Visit (INDEPENDENT_AMBULATORY_CARE_PROVIDER_SITE_OTHER): Payer: Medicare Other | Admitting: Endocrinology

## 2019-06-02 VITALS — BP 120/70 | HR 90 | Ht 63.0 in | Wt 141.2 lb

## 2019-06-02 DIAGNOSIS — R7989 Other specified abnormal findings of blood chemistry: Secondary | ICD-10-CM

## 2019-06-02 DIAGNOSIS — E042 Nontoxic multinodular goiter: Secondary | ICD-10-CM | POA: Diagnosis not present

## 2019-06-02 DIAGNOSIS — E0590 Thyrotoxicosis, unspecified without thyrotoxic crisis or storm: Secondary | ICD-10-CM

## 2019-06-02 DIAGNOSIS — M810 Age-related osteoporosis without current pathological fracture: Secondary | ICD-10-CM

## 2019-06-02 MED ORDER — METHIMAZOLE 5 MG PO TABS: 2.5000 mg | ORAL_TABLET | Freq: Every day | ORAL | 1 refills | Status: DC

## 2019-06-02 NOTE — Patient Instructions (Addendum)
I have sent a prescription to your pharmacy, to slow the thyroid. If ever you have fever while taking methimazole, stop it and call us, even if the reason is obvious, because of the risk of a rare side-effect. Please come back for a follow-up appointment in 3-4 weeks. When you come back, we'll check some blood tests for the osteoporosis, too.    ?????????????????????????????????????????, ??????????????????????????. ???????????????????????????????? methimazole, ????????????????????, ???????????????????????, ????????????????????????????????????. ?????????????? ??? ??????????? 3-4 ?????. ??????????????, ??????????????????????????????????????????????.

## 2019-06-02 NOTE — Progress Notes (Signed)
Subjective:    Patient ID: Alyssa Weaver, female    DOB: May 31, 1951, 68 y.o.   MRN: [id number]  HPI Pt is referred by Dr Pamella Pert, for hyperthyroidism.  Pt reports he was dx'ed with hyperthyroidism in.  He has never been on therapy for this.  He has never had XRT to the anterior neck, or thyroid surgery.  He does not consume kelp or any other non-prescribed thyroid medication.  He has never been on amiodarone.   Pt is referred by for osteoporosis.  Pt was noted to have osteoporosis in.  Pt says she noted nodular swelling at the ant neck since approx 1970.  she has never had bony fracture.  She has no history of any of the following: early menopause, multiple myeloma, renal dz, steroids, alcoholism, liver dz, hyperparathyroidism.  She does not take heparin or anticonvulsants.  She has been on alendronate x a few weeks.  She is a smoker.  She has slight myalgias throughout the body, but no assoc weakness.   Past Medical History:  Diagnosis Date  . Diabetes mellitus without complication ([place])   . Hyperlipidemia     Past Surgical History:  Procedure Laterality Date  . TUBAL LIGATION  1985    Social History   Socioeconomic History  . Marital status: Married    Spouse name: [name]  . Number of children: 5  . Years of education: 49  . Highest education level: Not on file  Occupational History  . Occupation: housewife  Social Needs  . Financial resource strain: Not on file  . Food insecurity    Worry: Not on file    Inability: Not on file  . Transportation needs    Medical: Not on file    Non-medical: Not on file  Tobacco Use  . Smoking status: Current Every Day Smoker    Packs/day: 0.75    Years: 43.00    Pack years: 32.25    Types: Cigarettes  . Smokeless tobacco: Never Used  . Tobacco comment: Husband smokes 1 ppd.  He might be willing to quit at same time.  Substance and Sexual Activity  . Alcohol use: No  . Drug use: No  . Sexual activity: Yes    Birth  control/protection: Post-menopausal  Lifestyle  . Physical activity    Days per week: Not on file    Minutes per session: Not on file  . Stress: Not on file  Relationships  . Social Herbalist on phone: Not on file    Gets together: Not on file    Attends religious service: Not on file    Active member of club or organization: Not on file    Attends meetings of clubs or organizations: Not on file    Relationship status: Not on file  . Intimate partner violence    Fear of current or ex partner: Not on file    Emotionally abused: Not on file    Physically abused: Not on file    Forced sexual activity: Not on file  Other Topics Concern  . Not on file  Social History Narrative   Originally from [place]   Refugees--lived in [place] 2 years.   Came to the [place] In [place].   Lived in the [place] until moving to [facility] Nov. 2016    Current Outpatient Medications on File Prior to Visit  Medication Sig Dispense Refill  . alendronate (FOSAMAX) 70 MG tablet Take 1 tablet (70 mg total) by mouth every  7 (seven) days. Take with a full glass of water on an empty stomach. 4 tablet 11  . atorvastatin (LIPITOR) 80 MG tablet Take 1 tablet (80 mg total) by mouth daily. 90 tablet 1  . Blood Glucose Monitoring Suppl (AGAMATRIX PRESTO) w/Device KIT Twice daily sugar checks before meals 1 kit 0  . Calcium Citrate-Vitamin D (CITRACAL/VITAMIN D) 250-200 MG-UNIT TABS 2 tabs by mouth twice daily 120 each   . cetirizine (ZYRTEC) 10 MG tablet Take 1 tablet by mouth once daily 30 tablet 0  . Dulaglutide (TRULICITY) 1.61 WR/6.0AV SOPN Inject 0.75 mg into the skin once a week. 4 pen 5  . glucose blood (ONE TOUCH ULTRA TEST) test strip Use to check CBGs qam fasting, 2 hrs postprandial, & if having hyper or hypoglycemic sxs. Dx E11.65, Z79.4 400 each 4  . ibuprofen (ADVIL,MOTRIN) 200 MG tablet Take 200 mg by mouth every 6 (six) hours as needed.    . Insulin Glargine (LANTUS SOLOSTAR) 100 UNIT/ML Solostar  Pen Inject 20 Units into the skin every morning. 5 pen PRN  . latanoprost (XALATAN) 0.005 % ophthalmic solution INSTILL 1 DROP INTO EACH EYE ONCE DAILY IN THE EVENING    . metFORMIN (GLUCOPHAGE) 1000 MG tablet Take 1 tablet (1,000 mg total) by mouth 2 (two) times daily with a meal. 180 tablet 1  . mometasone (NASONEX) 50 MCG/ACT nasal spray 2 sprays each nostril daily (Patient taking differently: 2 sprays each nostril daily prn) 17 g 12   No current facility-administered medications on file prior to visit.     No Known Allergies  Family History  Problem Relation Age of Onset  . Asthma Brother   . Thyroid disease Neg Hx   . Osteoporosis Neg Hx     BP 120/70 (BP Location: Right Arm, Patient Position: Sitting, Cuff Size: Normal)   Pulse 90   Ht '5\' 3"'  (1.6 m)   Wt 141 lb 3.2 oz (64 kg)   SpO2 99%   BMI 25.01 kg/m    Review of Systems denies weight loss, headache, hoarseness, visual loss, palpitations, sob, diarrhea, polyuria, edema, excessive diaphoresis, tremor, anxiety, heat intolerance, easy bruising, and rhinorrhea.      Objective:   Physical Exam VS: see vs page GEN: no distress HEAD: head: no deformity eyes: no periorbital swelling, no proptosis external nose and ears are normal NECK: thyroid is slightly enlarged, with multinodular surface CHEST WALL: no deformity LUNGS: clear to auscultation CV: reg rate and rhythm, no murmur ABD: abdomen is soft, nontender.  no hepatosplenomegaly.  not distended.  no hernia MUSCULOSKELETAL: muscle bulk and strength are grossly normal.  no obvious joint swelling.  gait is normal and steady EXTEMITIES: no deformity.  no edema PULSES: no carotid bruit NEURO:  cn 2-12 grossly intact.   readily moves all 4's.  sensation is intact to touch on all 4's.  No tremor SKIN:  Normal texture and temperature.  No rash or suspicious lesion is visible.  Not diaphoretic.   NODES:  None palpable at the neck PSYCH: alert, well-oriented.  Does not  appear anxious nor depressed.    Lab Results  Component Value Date   CREATININE 0.70 02/18/2019   BUN 12 02/18/2019   NA 146 (H) 02/18/2019   K 4.0 02/18/2019   CL 105 02/18/2019   CO2 21 02/18/2019    LMP (SPECIMEN 2 OF 2 COLLECTED 06-13-2018) CONSISTENT WITH BENIGN FOLLICULAR NODULE (BETHESDA CATEGORY II).  LUP (SPECIMEN 1 OF 2 COLLECTED 06-13-2018) CONSISTENT WITH  BENIGN FOLLICULAR NODULE (BETHESDA CATEGORY II).   [place]: There are additional bilateral subcentimeter isoechoic, mixed cystic/solid, and calcified nodules all measuring 10 mm or less in size. These would not meet criteria for any biopsy or follow-up.  Lab Results  Component Value Date   TSH 0.273 (L) 02/18/2019   Lab Results  Component Value Date   CALCIUM 9.6 02/18/2019   I have reviewed outside records, and summarized: Pt was noted to have low TSH, and referred here.  DM, dyslipidemia, and wellness were also addressed     Assessment & Plan:  MNG, new to me Hyperthyroidism, de to the goiter Osteoporosis, new to me  Patient Instructions  I have sent a prescription to your pharmacy, to slow the thyroid. If ever you have fever while taking methimazole, stop it and call us, even if the reason is obvious, because of the risk of a rare side-effect. Please come back for a follow-up appointment in 3-4 weeks. When you come back, we'll check some blood tests for the osteoporosis, too.    ?????????????????????????????????????????, ??????????????????????????. ???????????????????????????????? methimazole, ????????????????????, ???????????????????????, ????????????????????????????????????. ?????????????? ??? ??????????? 3-4 ?????. ??????????????, ??????????????????????????????????????????????.

## 2019-06-20 ENCOUNTER — Other Ambulatory Visit: Payer: Self-pay

## 2019-06-20 DIAGNOSIS — Z20822 Contact with and (suspected) exposure to covid-19: Secondary | ICD-10-CM

## 2019-06-22 LAB — NOVEL CORONAVIRUS, NAA: SARS-CoV-2, NAA: NOT DETECTED

## 2019-06-23 ENCOUNTER — Telehealth: Payer: Self-pay | Admitting: General Practice

## 2019-06-23 NOTE — Telephone Encounter (Signed)
Negative COVID results given. Patient results "NOT Detected." Caller expressed understanding. ° °

## 2019-06-27 ENCOUNTER — Other Ambulatory Visit: Payer: Self-pay | Admitting: Family Medicine

## 2019-06-27 ENCOUNTER — Telehealth: Payer: Self-pay | Admitting: Family Medicine

## 2019-06-27 NOTE — Telephone Encounter (Signed)
Patient requesting not on current med list.

## 2019-06-27 NOTE — Telephone Encounter (Signed)
Medication refill: esomeprazole (NEXIUM) 40 MG capsule [id number]   Pharmacy:  [facility], [address] [phone] (Phone) [phone] (Fax)   Pt aware of turn around time.

## 2019-06-30 ENCOUNTER — Other Ambulatory Visit: Payer: Self-pay

## 2019-07-01 ENCOUNTER — Ambulatory Visit (INDEPENDENT_AMBULATORY_CARE_PROVIDER_SITE_OTHER): Payer: Medicare Other | Admitting: Endocrinology

## 2019-07-01 VITALS — BP 122/68 | HR 92 | Temp 98.4°F | Ht 63.0 in | Wt 141.6 lb

## 2019-07-01 DIAGNOSIS — R7989 Other specified abnormal findings of blood chemistry: Secondary | ICD-10-CM

## 2019-07-01 DIAGNOSIS — M810 Age-related osteoporosis without current pathological fracture: Secondary | ICD-10-CM

## 2019-07-01 DIAGNOSIS — E0590 Thyrotoxicosis, unspecified without thyrotoxic crisis or storm: Secondary | ICD-10-CM | POA: Diagnosis not present

## 2019-07-01 LAB — VITAMIN D 25 HYDROXY (VIT D DEFICIENCY, FRACTURES): VITD: 46.66 ng/mL (ref 30.00–100.00)

## 2019-07-01 LAB — T4, FREE: Free T4: 0.98 ng/dL (ref 0.60–1.60)

## 2019-07-01 LAB — TSH: TSH: 0.18 u[IU]/mL — ABNORMAL LOW (ref 0.35–4.50)

## 2019-07-01 NOTE — Patient Instructions (Addendum)
Please try the methimazole again.  If ever you have fever while taking methimazole, stop it and call us, even if the reason is obvious, because of the risk of a rare side-effect. Please come back for a follow-up appointment in January. Blood tests are requested for you today.  We'll call your daughter with the results. You also have osteoporosis (soft bones).  We are also checking blood tests for this today, too.     ????????? methimazole ???????. ???????????????????????????????? methimazole, ????????????????????, ???????????????????????, ??????????????????????????????????????. ????????????????????????????????????. ????????????????????????????????????????. ??????????????????????????????????????????. ???????????????????? (?????????). ??????? ?? ??????????????????? ?? ?????????????????.

## 2019-07-01 NOTE — Progress Notes (Signed)
Subjective:    Patient ID: Alyssa Weaver, female    DOB: Mar 03, 1951, 68 y.o.   MRN: [id number]  HPI Pt returns for f/u of hyperthyroidism( dx'ed 2019; she was rx'ed tapazole in 2020 (she declines RAI rx); US showed MNG; bxs of 2 left sided nodules in 2018 showed beth cat 2: [place] says no f/u is needed).  2 weeks after starting tapazole, she developed slight pain at the mid-abdomen, but no assoc n/v.  sxs resolved after stopping it.  Pt returns for f/u of osteoporosis: Dx'ed: 2020 Secondary cause: smoking and hyperthyroidism Fractures: none Past rx: she took alendronate for a few weeks in 2020 Current rx: Last DEXA result (2020) worst T-score (spine) was -2.5 Other: none Interval hx: she is on no rx now Past Medical History:  Diagnosis Date  . Diabetes mellitus without complication ([place])   . Hyperlipidemia     Past Surgical History:  Procedure Laterality Date  . TUBAL LIGATION  1985    Social History   Socioeconomic History  . Marital status: Married    Spouse name: [name]  . Number of children: 5  . Years of education: 46  . Highest education level: Not on file  Occupational History  . Occupation: housewife  Social Needs  . Financial resource strain: Not on file  . Food insecurity    Worry: Not on file    Inability: Not on file  . Transportation needs    Medical: Not on file    Non-medical: Not on file  Tobacco Use  . Smoking status: Current Every Day Smoker    Packs/day: 0.75    Years: 43.00    Pack years: 32.25    Types: Cigarettes  . Smokeless tobacco: Never Used  . Tobacco comment: Husband smokes 1 ppd.  He might be willing to quit at same time.  Substance and Sexual Activity  . Alcohol use: No  . Drug use: No  . Sexual activity: Yes    Birth control/protection: Post-menopausal  Lifestyle  . Physical activity    Days per week: Not on file    Minutes per session: Not on file  . Stress: Not on file  Relationships  . Social Herbalist on  phone: Not on file    Gets together: Not on file    Attends religious service: Not on file    Active member of club or organization: Not on file    Attends meetings of clubs or organizations: Not on file    Relationship status: Not on file  . Intimate partner violence    Fear of current or ex partner: Not on file    Emotionally abused: Not on file    Physically abused: Not on file    Forced sexual activity: Not on file  Other Topics Concern  . Not on file  Social History Narrative   Originally from [place]   Refugees--lived in [place] 2 years.   [place] the [place].   Lived in the [place] until moving to [facility] Nov. 2016    Current Outpatient Medications on File Prior to Visit  Medication Sig Dispense Refill  . alendronate (FOSAMAX) 70 MG tablet Take 1 tablet (70 mg total) by mouth every 7 (seven) days. Take with a full glass of water on an empty stomach. 4 tablet 11  . atorvastatin (LIPITOR) 80 MG tablet Take 1 tablet (80 mg total) by mouth daily. 90 tablet 1  . Blood Glucose Monitoring Suppl (AGAMATRIX  PRESTO) w/Device KIT Twice daily sugar checks before meals 1 kit 0  . Calcium Citrate-Vitamin D (CITRACAL/VITAMIN D) 250-200 MG-UNIT TABS 2 tabs by mouth twice daily 120 each   . cetirizine (ZYRTEC) 10 MG tablet Take 1 tablet by mouth once daily 30 tablet 0  . Dulaglutide (TRULICITY) 8.63 OT/7.7NH SOPN Inject 0.75 mg into the skin once a week. 4 pen 5  . glucose blood (ONE TOUCH ULTRA TEST) test strip Use to check CBGs qam fasting, 2 hrs postprandial, & if having hyper or hypoglycemic sxs. Dx E11.65, Z79.4 400 each 4  . ibuprofen (ADVIL,MOTRIN) 200 MG tablet Take 200 mg by mouth every 6 (six) hours as needed.    . Insulin Glargine (LANTUS SOLOSTAR) 100 UNIT/ML Solostar Pen Inject 20 Units into the skin every morning. 5 pen PRN  . latanoprost (XALATAN) 0.005 % ophthalmic solution INSTILL 1 DROP INTO EACH EYE ONCE DAILY IN THE EVENING    . metFORMIN (GLUCOPHAGE) 1000 MG tablet Take  1 tablet (1,000 mg total) by mouth 2 (two) times daily with a meal. 180 tablet 1  . methimazole (TAPAZOLE) 5 MG tablet Take 0.5 tablets (2.5 mg total) by mouth daily. 45 tablet 1  . mometasone (NASONEX) 50 MCG/ACT nasal spray 2 sprays each nostril daily (Patient taking differently: 2 sprays each nostril daily prn) 17 g 12   No current facility-administered medications on file prior to visit.     No Known Allergies  Family History  Problem Relation Age of Onset  . Asthma Brother   . Thyroid disease Neg Hx   . Osteoporosis Neg Hx     BP 122/68 (BP Location: Right Arm, Patient Position: Sitting, Cuff Size: Normal)   Pulse 92   Temp 98.4 F (36.9 C)   Ht '5\' 3"'  (1.6 m)   Wt 141 lb 9.6 oz (64.2 kg)   SpO2 99%   BMI 25.08 kg/m    Review of Systems Denies fever and falls.      Objective:   Physical Exam VITAL SIGNS:  See vs page GENERAL: no distress NECK: thyroid is slightly enlarged, with multinodular surface.    Lab Results  Component Value Date   ALT 18 02/18/2019   AST 21 02/18/2019   ALKPHOS 62 02/18/2019   BILITOT <0.2 02/18/2019        Assessment & Plan:  Osteoporosis: check labs.  She prob needs ibandronate Hyperthyroidism abd pain, prob not related to methimazole.  Patient Instructions  Please try the methimazole again.  If ever you have fever while taking methimazole, stop it and call us, even if the reason is obvious, because of the risk of a rare side-effect. Please come back for a follow-up appointment in January. Blood tests are requested for you today.  We'll call your daughter with the results. You also have osteoporosis (soft bones).  We are also checking blood tests for this today, too.     ????????? methimazole ???????. ???????????????????????????????? methimazole, ????????????????????, ???????????????????????, ??????????????????????????????????????. ????????????????????????????????????. ????????????????????????????????????????.  ??????????????????????????????????????????. ???????????????????? (?????????). ??????? ?? ??????????????????? ?? ?????????????????.

## 2019-07-02 LAB — PTH, INTACT AND CALCIUM
Calcium: 9.3 mg/dL (ref 8.6–10.4)
PTH: 20 pg/mL (ref 14–64)

## 2019-07-02 MED ORDER — IBANDRONATE SODIUM 150 MG PO TABS: 150.0000 mg | ORAL_TABLET | ORAL | 3 refills | Status: DC

## 2019-07-03 ENCOUNTER — Telehealth: Payer: Self-pay

## 2019-07-03 NOTE — Telephone Encounter (Signed)
-----   Message from Renato Shin, MD sent at 07/02/2019  6:36 PM EST ----- please contact patient's dtr: The thyroid is still overactive.  Please take the methimazole as rx'ed. The other blood tests are good.  I have sent a prescription to your pharmacy, for a once a month osteoporosis pill. It has instructions about taking it while upright, with water only, and on an empty stomach. I'll see you next month.

## 2019-07-03 NOTE — Telephone Encounter (Signed)
Lab results reviewed by Dr. Ellison. Letter has been mailed. For future reference, letter can be found in Epic. 

## 2019-07-15 NOTE — Telephone Encounter (Signed)
Please schedule patient to discuss her request for nexium, thanks

## 2019-07-17 ENCOUNTER — Ambulatory Visit (INDEPENDENT_AMBULATORY_CARE_PROVIDER_SITE_OTHER): Payer: Medicare Other | Admitting: Family Medicine

## 2019-07-17 ENCOUNTER — Encounter: Payer: Self-pay | Admitting: Family Medicine

## 2019-07-17 ENCOUNTER — Other Ambulatory Visit: Payer: Self-pay

## 2019-07-17 VITALS — BP 118/76 | HR 86 | Temp 99.1°F | Ht 63.0 in | Wt 139.2 lb

## 2019-07-17 DIAGNOSIS — M818 Other osteoporosis without current pathological fracture: Secondary | ICD-10-CM

## 2019-07-17 DIAGNOSIS — K219 Gastro-esophageal reflux disease without esophagitis: Secondary | ICD-10-CM | POA: Insufficient documentation

## 2019-07-17 DIAGNOSIS — E0590 Thyrotoxicosis, unspecified without thyrotoxic crisis or storm: Secondary | ICD-10-CM | POA: Diagnosis not present

## 2019-07-17 MED ORDER — PANTOPRAZOLE SODIUM 20 MG PO TBEC: 20.0000 mg | DELAYED_RELEASE_TABLET | Freq: Every day | ORAL | 3 refills | Status: DC | PRN

## 2019-07-17 NOTE — Patient Instructions (Signed)
° ° ° °  If you have lab work done today you will be contacted with your lab results within the next 2 weeks.  If you have not heard from us then please contact us. The fastest way to get your results is to register for My Chart. ° ° °IF you received an x-ray today, you will receive an invoice from [facility]. Please contact [facility] at [phone] with questions or concerns regarding your invoice.  ° °IF you received labwork today, you will receive an invoice from [facility]. Please contact [facility] at [phone] with questions or concerns regarding your invoice.  ° °Our billing staff will not be able to assist you with questions regarding bills from these companies. ° °You will be contacted with the lab results as soon as they are available. The fastest way to get your results is to activate your My Chart account. Instructions are located on the last page of this paperwork. If you have not heard from us regarding the results in 2 weeks, please contact this office. °  ° ° ° °

## 2019-07-17 NOTE — Progress Notes (Signed)
12/17/20208:45 AM  Alyssa Weaver [birth year], 68 y.o., female [id number]  Chief Complaint  Patient presents with  . Follow-up    wants refill on nexium. was given sodium tabs by tsh doctor, thinks med is too strong. Wants to know can you adjust or does she need to go bk to tsh doctor. Also, given methimazole 5 mg by tsh doctor and does not want to take that med. Given fosamax by pcp. Which one should she take?    HPI:   Patient is a 68 y.o. female with past medical history significant for  DM2 insulin dependent, HLP, subclinical hypothyroidism/multinodular thyroid, seasonal allergies who presents today with several concerns  1. Requesting refill of nexium  2. Started fosamax by me oct 2020 for dexa with osteoporosis  3. Endo - last OV dec 2020, restarted methimazole,ordered ibandronate (not sure if due to GI issues or did not see rx for fosamax I had just started) ordered labs, fu in Jan  Patient had been taking alendronate for the past couple of months with mild GI upset, she has not started boniva yet  Has been taking methimazole daily wo issues  Reports that she was started on nexium prn while she lived in [place] She reports she uses nexium maybe 3 x month She has never tried anything else  [place] interpreter present  Depression screen [facility] 2/9 07/17/2019 05/20/2019 11/25/2018  Decreased Interest 0 0 0  Down, Depressed, Hopeless 0 0 0  PHQ - 2 Score 0 0 0  Altered sleeping - - -  Tired, decreased energy - - -  Change in appetite - - -  Feeling bad or failure about yourself  - - -  Trouble concentrating - - -  Moving slowly or fidgety/restless - - -  Suicidal thoughts - - -  PHQ-9 Score - - -    Fall Risk  07/17/2019 05/20/2019 11/25/2018 10/07/2018 07/19/2018  Falls in the past year? 0 0 0 0 0  Number falls in past yr: 0 0 0 - -  Injury with Fall? 0 0 0 - -  Follow up - - - Falls evaluation completed -     No Known Allergies  Prior to Admission medications    Medication Sig Start Date End Date Taking? Authorizing Provider  alendronate (FOSAMAX) 70 MG tablet Take 1 tablet (70 mg total) by mouth every 7 (seven) days. Take with a full glass of water on an empty stomach. 05/20/19  Yes Rutherford Guys, MD  atorvastatin (LIPITOR) 80 MG tablet Take 1 tablet (80 mg total) by mouth daily. 11/25/18  Yes Rutherford Guys, MD  Blood Glucose Monitoring Suppl [place] PRESTO) w/Device KIT Twice daily sugar checks before meals 04/18/17  Yes Mack Hook, MD  Calcium Citrate-Vitamin D (CITRACAL/VITAMIN D) 250-200 MG-UNIT TABS 2 tabs by mouth twice daily 08/24/17  Yes Mack Hook, MD  cetirizine (ZYRTEC) 10 MG tablet Take 1 tablet by mouth once daily 06/27/19  Yes Rutherford Guys, MD  Dulaglutide (TRULICITY) 3.15 XY/5.8PF SOPN Inject 0.75 mg into the skin once a week. 05/20/19  Yes Rutherford Guys, MD  glucose blood (ONE TOUCH ULTRA TEST) test strip Use to check CBGs qam fasting, 2 hrs postprandial, & if having hyper or hypoglycemic sxs. Dx E11.65, Z79.4 05/07/18  Yes Shawnee Knapp, MD  ibandronate (BONIVA) 150 MG tablet Take 1 tablet (150 mg total) by mouth every 30 (thirty) days. Take in the morning with a full glass of water, on an empty  stomach, and do not take anything else by mouth or lie down for the next 30 min. 07/02/19  Yes Renato Shin, MD  ibuprofen (ADVIL,MOTRIN) 200 MG tablet Take 200 mg by mouth every 6 (six) hours as needed.   Yes [provider]  Insulin Glargine (LANTUS SOLOSTAR) 100 UNIT/ML Solostar Pen Inject 20 Units into the skin every morning. 07/19/18  Yes Shawnee Knapp, MD  latanoprost (XALATAN) 0.005 % ophthalmic solution INSTILL 1 DROP INTO EACH EYE ONCE DAILY IN THE EVENING 04/30/19  Yes [provider]  metFORMIN (GLUCOPHAGE) 1000 MG tablet Take 1 tablet (1,000 mg total) by mouth 2 (two) times daily with a meal. 11/25/18  Yes Rutherford Guys, MD  methimazole (TAPAZOLE) 5 MG tablet Take 0.5 tablets (2.5 mg total) by  mouth daily. 06/02/19  Yes Renato Shin, MD  mometasone (NASONEX) 50 MCG/ACT nasal spray 2 sprays each nostril daily Patient taking differently: 2 sprays each nostril daily prn 07/19/18  Yes Shawnee Knapp, MD    Past Medical History:  Diagnosis Date  . Diabetes mellitus without complication ([place])   . Hyperlipidemia     Past Surgical History:  Procedure Laterality Date  . TUBAL LIGATION  1985    Social History   Tobacco Use  . Smoking status: Current Every Day Smoker    Packs/day: 0.75    Years: 43.00    Pack years: 32.25    Types: Cigarettes  . Smokeless tobacco: Never Used  . Tobacco comment: Husband smokes 1 ppd.  He might be willing to quit at same time.  Substance Use Topics  . Alcohol use: No    Family History  Problem Relation Age of Onset  . Asthma Brother   . Thyroid disease Neg Hx   . Osteoporosis Neg Hx     Review of Systems  Constitutional: Negative for chills and fever.  Respiratory: Negative for cough and shortness of breath.   Cardiovascular: Negative for chest pain, palpitations and leg swelling.  Gastrointestinal: Negative for abdominal pain, nausea and vomiting.     OBJECTIVE:  Today's Vitals   07/17/19 0840  BP: 118/76  Pulse: 86  Temp: 99.1 F (37.3 C)  SpO2: 100%  Weight: 139 lb 3.2 oz (63.1 kg)  Height: _0  (1.6 m)   Body mass index is 24.66 kg/m.   Physical Exam Vitals and nursing note reviewed.  Constitutional:      Appearance: She is well-developed.  HENT:     Head: Normocephalic and atraumatic.     Mouth/Throat:     Pharynx: No oropharyngeal exudate.  Eyes:     General: No scleral icterus.    Conjunctiva/sclera: Conjunctivae normal.     Pupils: Pupils are equal, round, and reactive to light.  Cardiovascular:     Rate and Rhythm: Normal rate and regular rhythm.     Heart sounds: Normal heart sounds. No murmur. No friction rub. No gallop.   Pulmonary:     Effort: Pulmonary effort is normal.     Breath sounds: Normal  breath sounds. No wheezing or rales.  Musculoskeletal:     Cervical back: Neck supple.     Right lower leg: No edema.     Left lower leg: No edema.  Skin:    General: Skin is warm and dry.  Neurological:     Mental Status: She is alert and oriented to person, place, and time.     No results found for this or any previous visit (from the  past 24 hour(s)).  No results found.   ASSESSMENT and PLAN  1. Secondary osteoporosis Discussed starting boniva given GI issues on alendronate. Endo has pending labs, next dexa due oct 2022  2. Gastroesophageal reflux disease without esophagitis Trial of prn protonix per insurance preference  3. Hyperthyroidism Managed by endo, cont with methimazole  Other orders - pantoprazole (PROTONIX) 20 MG tablet; Take 1 tablet (20 mg total) by mouth daily as needed for heartburn.  Return for as scheduled.    Rutherford Guys, MD [facility] at [facility] [place], [place] [address] Ph.  [phone] Fax [phone]

## 2019-08-15 ENCOUNTER — Other Ambulatory Visit: Payer: Self-pay

## 2019-08-18 ENCOUNTER — Ambulatory Visit (INDEPENDENT_AMBULATORY_CARE_PROVIDER_SITE_OTHER): Payer: Medicare Other | Admitting: Family Medicine

## 2019-08-18 ENCOUNTER — Other Ambulatory Visit: Payer: Self-pay

## 2019-08-18 ENCOUNTER — Encounter: Payer: Self-pay | Admitting: Family Medicine

## 2019-08-18 VITALS — BP 122/76 | HR 95 | Temp 98.3°F | Ht 63.0 in | Wt 140.0 lb

## 2019-08-18 DIAGNOSIS — T887XXA Unspecified adverse effect of drug or medicament, initial encounter: Secondary | ICD-10-CM | POA: Diagnosis not present

## 2019-08-18 DIAGNOSIS — E782 Mixed hyperlipidemia: Secondary | ICD-10-CM | POA: Diagnosis not present

## 2019-08-18 DIAGNOSIS — Z794 Long term (current) use of insulin: Secondary | ICD-10-CM | POA: Diagnosis not present

## 2019-08-18 DIAGNOSIS — E1165 Type 2 diabetes mellitus with hyperglycemia: Secondary | ICD-10-CM | POA: Diagnosis not present

## 2019-08-18 LAB — POCT GLYCOSYLATED HEMOGLOBIN (HGB A1C): Hemoglobin A1C: 7.6 % — AB (ref 4.0–5.6)

## 2019-08-18 NOTE — Patient Instructions (Signed)
° ° ° °  If you have lab work done today you will be contacted with your lab results within the next 2 weeks.  If you have not heard from us then please contact us. The fastest way to get your results is to register for My Chart. ° ° °IF you received an x-ray today, you will receive an invoice from [facility]. Please contact [facility] at [phone] with questions or concerns regarding your invoice.  ° °IF you received labwork today, you will receive an invoice from [facility]. Please contact [facility] at [phone] with questions or concerns regarding your invoice.  ° °Our billing staff will not be able to assist you with questions regarding bills from these companies. ° °You will be contacted with the lab results as soon as they are available. The fastest way to get your results is to activate your My Chart account. Instructions are located on the last page of this paperwork. If you have not heard from us regarding the results in 2 weeks, please contact this office. °  ° ° ° °

## 2019-08-18 NOTE — Progress Notes (Signed)
1/18/20212:07 PM  Alyssa Weaver 07/18/1951, 69 y.o., female [id number]  Chief Complaint  Patient presents with  . Diabetes  . Fatigue    noticed when she wakes in the morning when meds were changed for bone health    HPI:   Patient is a 69 y.o. female with past medical history significant for DM2 insulin dependent, HLP, subclinical hyperthyroidism/multinodular thyroid, seasonal allergieswho presents today for routine followup   Last OV Dec 2020 - started boniva and restarted protonix Sees endo tomorrow for hyperthyroidisim  Reports that since she started trulicity has been feeling tired and occasionally dizzy, she reports that it has not improved with time She checks cbgs TID, 80-130s She reports infrequent hypoglycemia, nocturnal into the 60s x 2   Lab Results  Component Value Date   HGBA1C 8.6 (A) 05/20/2019   HGBA1C 8.6 (A) 07/19/2018   HGBA1C 8.6 (H) 03/06/2018   Lab Results  Component Value Date   LDLCALC 41 02/18/2019   CREATININE 0.70 02/18/2019    loatian interpreter present  Depression screen [facility] 2/9 07/17/2019 05/20/2019 11/25/2018  Decreased Interest 0 0 0  Down, Depressed, Hopeless 0 0 0  PHQ - 2 Score 0 0 0  Altered sleeping - - -  Tired, decreased energy - - -  Change in appetite - - -  Feeling bad or failure about yourself  - - -  Trouble concentrating - - -  Moving slowly or fidgety/restless - - -  Suicidal thoughts - - -  PHQ-9 Score - - -    Fall Risk  08/18/2019 07/17/2019 05/20/2019 11/25/2018 10/07/2018  Falls in the past year? 0 0 0 0 0  Number falls in past yr: 0 0 0 0 -  Injury with Fall? 0 0 0 0 -  Follow up - - - - Falls evaluation completed     No Known Allergies  Prior to Admission medications   Medication Sig Start Date End Date Taking? Authorizing Provider  atorvastatin (LIPITOR) 80 MG tablet Take 1 tablet (80 mg total) by mouth daily. 11/25/18  Yes Rutherford Guys, MD  Blood Glucose Monitoring Suppl [facility] PRESTO)  w/Device KIT Twice daily sugar checks before meals 04/18/17  Yes Mack Hook, MD  Calcium Citrate-Vitamin D (CITRACAL/VITAMIN D) 250-200 MG-UNIT TABS 2 tabs by mouth twice daily 08/24/17  Yes Mack Hook, MD  cetirizine (ZYRTEC) 10 MG tablet Take 1 tablet by mouth once daily 06/27/19  Yes Rutherford Guys, MD  Dulaglutide (TRULICITY) 6.78 LF/8.1OF SOPN Inject 0.75 mg into the skin once a week. 05/20/19  Yes Rutherford Guys, MD  glucose blood (ONE TOUCH ULTRA TEST) test strip Use to check CBGs qam fasting, 2 hrs postprandial, & if having hyper or hypoglycemic sxs. Dx E11.65, Z79.4 05/07/18  Yes Shawnee Knapp, MD  ibandronate (BONIVA) 150 MG tablet Take 1 tablet (150 mg total) by mouth every 30 (thirty) days. Take in the morning with a full glass of water, on an empty stomach, and do not take anything else by mouth or lie down for the next 30 min. 07/02/19  Yes Renato Shin, MD  ibuprofen (ADVIL,MOTRIN) 200 MG tablet Take 200 mg by mouth every 6 (six) hours as needed.   Yes [provider]  Insulin Glargine (LANTUS SOLOSTAR) 100 UNIT/ML Solostar Pen Inject 20 Units into the skin every morning. 07/19/18  Yes Shawnee Knapp, MD  latanoprost (XALATAN) 0.005 % ophthalmic solution INSTILL 1 DROP INTO EACH EYE ONCE DAILY IN THE EVENING  04/30/19  Yes [provider]  metFORMIN (GLUCOPHAGE) 1000 MG tablet Take 1 tablet (1,000 mg total) by mouth 2 (two) times daily with a meal. 11/25/18  Yes Rutherford Guys, MD  methimazole (TAPAZOLE) 5 MG tablet Take 0.5 tablets (2.5 mg total) by mouth daily. 06/02/19  Yes Renato Shin, MD  mometasone (NASONEX) 50 MCG/ACT nasal spray 2 sprays each nostril daily Patient taking differently: 2 sprays each nostril daily prn 07/19/18  Yes Shawnee Knapp, MD  pantoprazole (PROTONIX) 20 MG tablet Take 1 tablet (20 mg total) by mouth daily as needed for heartburn. 07/17/19  Yes Rutherford Guys, MD    Past Medical History:  Diagnosis Date  . Diabetes mellitus  without complication ([place])   . Hyperlipidemia     Past Surgical History:  Procedure Laterality Date  . TUBAL LIGATION  1985    Social History   Tobacco Use  . Smoking status: Current Every Day Smoker    Packs/day: 0.75    Years: 43.00    Pack years: 32.25    Types: Cigarettes  . Smokeless tobacco: Never Used  . Tobacco comment: Husband smokes 1 ppd.  He might be willing to quit at same time.  Substance Use Topics  . Alcohol use: No    Family History  Problem Relation Age of Onset  . Asthma Brother   . Thyroid disease Neg Hx   . Osteoporosis Neg Hx     Review of Systems  Constitutional: Negative for chills and fever.  Respiratory: Negative for cough and shortness of breath.   Cardiovascular: Negative for chest pain, palpitations and leg swelling.  Gastrointestinal: Negative for abdominal pain, constipation, diarrhea, nausea and vomiting.   Per hpi  OBJECTIVE:  Today's Vitals   08/18/19 1403  BP: 122/76  Pulse: 95  Temp: 98.3 F (36.8 C)  SpO2: 99%  Weight: 140 lb (63.5 kg)  Height: '5\' 3"'  (1.6 m)   Body mass index is 24.8 kg/m.   Physical Exam Vitals and nursing note reviewed.  Constitutional:      Appearance: She is well-developed.  HENT:     Head: Normocephalic and atraumatic.     Mouth/Throat:     Pharynx: No oropharyngeal exudate.  Eyes:     General: No scleral icterus.    Conjunctiva/sclera: Conjunctivae normal.     Pupils: Pupils are equal, round, and reactive to light.  Cardiovascular:     Rate and Rhythm: Normal rate and regular rhythm.     Heart sounds: Normal heart sounds. No murmur. No friction rub. No gallop.   Pulmonary:     Effort: Pulmonary effort is normal.     Breath sounds: Normal breath sounds. No wheezing or rales.  Musculoskeletal:     Cervical back: Neck supple.  Skin:    General: Skin is warm and dry.  Neurological:     Mental Status: She is alert and oriented to person, place, and time.     Results for orders  placed or performed in visit on 08/18/19 (from the past 24 hour(s))  POCT glycosylated hemoglobin (Hb A1C)     Status: Abnormal   Collection Time: 08/18/19  2:35 PM  Result Value Ref Range   Hemoglobin A1C 7.6 (A) 4.0 - 5.6 %   HbA1c POC (<> result, manual entry)     HbA1c, POC (prediabetic range)     HbA1c, POC (controlled diabetic range)      No results found.   ASSESSMENT and PLAN  1.  Type 2 diabetes mellitus with hyperglycemia, with long-term current use of insulin (HCC) Improving. Discussed treatment options given side effect on trulicity, at this time she wants to remain on current regime. She will reach out if wanting to change. Would change to jardiance, reviewed r/se/b. Cont LFM - Lipid panel - CMP14+EGFR - Microalbumin / creatinine urine ratio - POCT glycosylated hemoglobin (Hb A1C)  2. Medication side effect See #1  3. Mixed hyperlipidemia Checking labs today, medications will be adjusted as needed.  - Lipid panel - CMP14+EGFR  Return in about 3 months (around 11/16/2019).    Rutherford Guys, MD Primary Care at [place], [place] [address] Ph.  [phone] Fax [phone]

## 2019-08-19 ENCOUNTER — Ambulatory Visit: Payer: Medicare Other | Admitting: Endocrinology

## 2019-08-19 ENCOUNTER — Other Ambulatory Visit: Payer: Self-pay | Admitting: Family Medicine

## 2019-08-19 DIAGNOSIS — E1165 Type 2 diabetes mellitus with hyperglycemia: Secondary | ICD-10-CM

## 2019-08-19 DIAGNOSIS — Z794 Long term (current) use of insulin: Secondary | ICD-10-CM

## 2019-08-19 LAB — CMP14+EGFR
ALT: 23 IU/L (ref 0–32)
AST: 22 IU/L (ref 0–40)
Albumin/Globulin Ratio: 2 (ref 1.2–2.2)
Albumin: 4.6 g/dL (ref 3.8–4.8)
Alkaline Phosphatase: 55 IU/L (ref 39–117)
BUN/Creatinine Ratio: 18 (ref 12–28)
BUN: 14 mg/dL (ref 8–27)
Bilirubin Total: 0.2 mg/dL (ref 0.0–1.2)
CO2: 25 mmol/L (ref 20–29)
Calcium: 9.3 mg/dL (ref 8.7–10.3)
Chloride: 101 mmol/L (ref 96–106)
Creatinine, Ser: 0.76 mg/dL (ref 0.57–1.00)
GFR calc Af Amer: 94 mL/min/{1.73_m2} (ref >59)
GFR calc non Af Amer: 81 mL/min/{1.73_m2} (ref >59)
Globulin, Total: 2.3 g/dL (ref 1.5–4.5)
Glucose: 196 mg/dL — ABNORMAL HIGH (ref 65–99)
Potassium: 4.6 mmol/L (ref 3.5–5.2)
Sodium: 140 mmol/L (ref 134–144)
Total Protein: 6.9 g/dL (ref 6.0–8.5)

## 2019-08-19 LAB — LIPID PANEL
Chol/HDL Ratio: 2.9 ratio (ref 0.0–4.4)
Cholesterol, Total: 100 mg/dL (ref 100–199)
HDL: 35 mg/dL — ABNORMAL LOW (ref >39)
LDL Chol Calc (NIH): 36 mg/dL (ref 0–99)
Triglycerides: 177 mg/dL — ABNORMAL HIGH (ref 0–149)
VLDL Cholesterol Cal: 29 mg/dL (ref 5–40)

## 2019-08-19 LAB — MICROALBUMIN / CREATININE URINE RATIO
Creatinine, Urine: 198.2 mg/dL
Microalb/Creat Ratio: 12 mg/g creat (ref 0–29)
Microalbumin, Urine: 24.2 ug/mL

## 2019-08-21 NOTE — Telephone Encounter (Signed)
Patient recieived metformin , but she needs glucose test strip ONE TOUCH   glucose blood (ONE TOUCH ULTRA TEST) test strip [id number]  FR

## 2019-08-25 MED ORDER — GLUCOSE BLOOD VI STRP: ORAL_STRIP | 4 refills | Status: DC

## 2019-08-25 NOTE — Telephone Encounter (Signed)
Rx has been sent to pharmacy on file.  

## 2019-09-01 ENCOUNTER — Encounter: Payer: Self-pay | Admitting: Radiology

## 2019-09-18 ENCOUNTER — Other Ambulatory Visit: Payer: Self-pay | Admitting: Family Medicine

## 2019-09-18 NOTE — Telephone Encounter (Signed)
Requested medications are due for refill today?  Not on patient's active medication list.  Original RX was discontinued by Dr. Pamella Pert on 08/18/2019  Requested medications are on active medication list? no  Last Refill:   07/14/2019  Future visit scheduled? no  Notes to Clinic:

## 2019-09-23 ENCOUNTER — Telehealth: Payer: Self-pay | Admitting: Family Medicine

## 2019-09-23 NOTE — Telephone Encounter (Signed)
Pls make telemed appt for this pt, she has questions about the covid vaccine

## 2019-09-23 NOTE — Telephone Encounter (Signed)
Pt's  daughter has made her mom an appointment on 09/29/19 for the covid vaccine. She is wondering if it's ok for her to get this done. Please advise at [phone]

## 2019-09-24 NOTE — Telephone Encounter (Signed)
Called patient and scheduled a virtual

## 2019-09-25 ENCOUNTER — Telehealth (INDEPENDENT_AMBULATORY_CARE_PROVIDER_SITE_OTHER): Payer: Medicare Other | Admitting: Family Medicine

## 2019-09-25 ENCOUNTER — Other Ambulatory Visit: Payer: Self-pay

## 2019-09-25 DIAGNOSIS — Z7185 Encounter for immunization safety counseling: Secondary | ICD-10-CM

## 2019-09-25 DIAGNOSIS — Z7189 Other specified counseling: Secondary | ICD-10-CM

## 2019-09-25 NOTE — Progress Notes (Signed)
Virtual Visit Note  I connected with patient on 09/25/19 at 1104am by phone and verified that I am speaking with the correct person using two identifiers. Alyssa Weaver is currently located at home and patient, husband and daughter is currently with them during visit. The provider, Rutherford Guys, MD is located in their office at time of visit.  I discussed the limitations, risks, security and privacy concerns of performing an evaluation and management service by telephone and the availability of in person appointments. I also discussed with the patient that there may be a patient responsible charge related to this service. The patient expressed understanding and agreed to proceed.   CC: questions about covid vaccine  HPI ? Patient is scheduled to get her first covid vaccine 09/29/19 at noon She wants to make sure that there are no contraindications with her current medications She has no other concerns today    No Known Allergies  Prior to Admission medications   Medication Sig Start Date End Date Taking? Authorizing Provider  atorvastatin (LIPITOR) 80 MG tablet Take 1 tablet (80 mg total) by mouth daily. 11/25/18  Yes Rutherford Guys, MD  Blood Glucose Monitoring Suppl [facility] PRESTO) w/Device KIT Twice daily sugar checks before meals 04/18/17  Yes Mack Hook, MD  Calcium Citrate-Vitamin D (CITRACAL/VITAMIN D) 250-200 MG-UNIT TABS 2 tabs by mouth twice daily 08/24/17  Yes Mack Hook, MD  cetirizine (ZYRTEC) 10 MG tablet Take 1 tablet by mouth once daily 06/27/19  Yes Pamella Pert, Coral Soler M, MD  glucose blood (ONE TOUCH ULTRA TEST) test strip Use to check CBGs qam fasting, 2 hrs postprandial, & if having hyper or hypoglycemic sxs. Dx E11.65, Z79.4 08/25/19  Yes Rutherford Guys, MD  ibandronate (BONIVA) 150 MG tablet Take 1 tablet (150 mg total) by mouth every 30 (thirty) days. Take in the morning with a full glass of water, on an empty stomach, and do not take anything  else by mouth or lie down for the next 30 min. 07/02/19  Yes Renato Shin, MD  ibuprofen (ADVIL,MOTRIN) 200 MG tablet Take 200 mg by mouth every 6 (six) hours as needed.   Yes [provider]  Insulin Glargine (LANTUS SOLOSTAR) 100 UNIT/ML Solostar Pen Inject 20 Units into the skin every morning. 07/19/18  Yes Shawnee Knapp, MD  latanoprost (XALATAN) 0.005 % ophthalmic solution INSTILL 1 DROP INTO EACH EYE ONCE DAILY IN THE EVENING 04/30/19  Yes [provider]  metFORMIN (GLUCOPHAGE) 1000 MG tablet TAKE 1 TABLET BY MOUTH TWICE DAILY WITH A MEAL 08/19/19  Yes Rutherford Guys, MD  methimazole (TAPAZOLE) 5 MG tablet Take 0.5 tablets (2.5 mg total) by mouth daily. 06/02/19  Yes Renato Shin, MD  mometasone (NASONEX) 50 MCG/ACT nasal spray 2 sprays each nostril daily Patient taking differently: 2 sprays each nostril daily prn 07/19/18  Yes Shawnee Knapp, MD  pantoprazole (PROTONIX) 20 MG tablet Take 1 tablet (20 mg total) by mouth daily as needed for heartburn. 07/17/19  Yes Rutherford Guys, MD  TRULICITY 7.82 NF/6.2ZH SOPN INJECT 0.75MG INTO THE SKIN ONCE WEEK 09/22/19  Yes Rutherford Guys, MD    Past Medical History:  Diagnosis Date  . Diabetes mellitus without complication ([place])   . Hyperlipidemia     Past Surgical History:  Procedure Laterality Date  . TUBAL LIGATION  1985    Social History   Tobacco Use  . Smoking status: Current Every Day Smoker    Packs/day: 0.75  Years: 43.00    Pack years: 32.25    Types: Cigarettes  . Smokeless tobacco: Never Used  . Tobacco comment: Husband smokes 1 ppd.  He might be willing to quit at same time.  Substance Use Topics  . Alcohol use: No    Family History  Problem Relation Age of Onset  . Asthma Brother   . Thyroid disease Neg Hx   . Osteoporosis Neg Hx     ROS Per hpi  Objective  Vitals as reported by the patient: none Gen: AAOx3, NAD Speaking in full sentences, no resp distress noted   ASSESSMENT and  PLAN  1. Vaccine counseling Provided education regarding covid vaccine, no contraindications noted. Discussed known potential reactions, supportive measures and importance of completing series.  FOLLOW-UP: prn   The above assessment and management plan was discussed with the patient. The patient verbalized understanding of and has agreed to the management plan. Patient is aware to call the clinic if symptoms persist or worsen. Patient is aware when to return to the clinic for a follow-up visit. Patient educated on when it is appropriate to go to the emergency department.    I provided 12 minutes of non-face-to-face time during this encounter.  Rutherford Guys, MD Primary Care at [place], [place] [address] Ph.  [phone] Fax [phone]

## 2019-09-25 NOTE — Progress Notes (Signed)
Pt's daughter is having concerns about the covid vaccine that her mother is scheduled to get 09/29/19. She would like to discuss the meds her mother is taking just to make sure there are no contraindications

## 2019-09-29 ENCOUNTER — Ambulatory Visit: Payer: Medicare Other | Attending: Internal Medicine

## 2019-09-29 DIAGNOSIS — Z23 Encounter for immunization: Secondary | ICD-10-CM | POA: Insufficient documentation

## 2019-09-29 NOTE — Progress Notes (Signed)
   Covid-19 Vaccination Clinic  Name:  Jesley Balka    MRN: [id number] DOB: [birth year]  09/29/2019  Ms. Dishon was observed post Covid-19 immunization for 15 minutes without incidence. She was provided with Vaccine Information Sheet and instruction to access the V-Safe system.   Ms. Negroni was instructed to call 911 with any severe reactions post vaccine: Marland Kitchen Difficulty breathing  . Swelling of your face and throat  . A fast heartbeat  . A bad rash all over your body  . Dizziness and weakness    Immunizations Administered    Name Date Dose VIS Date Route   Pfizer COVID-19 Vaccine 09/29/2019  9:02 AM 0.3 mL 07/11/2019 Intramuscular   Manufacturer: [place]   Lot: [id number]   [place]: [id number]

## 2019-10-08 ENCOUNTER — Other Ambulatory Visit: Payer: Self-pay | Admitting: Family Medicine

## 2019-10-08 NOTE — Telephone Encounter (Signed)
Patient call received requesting insulin glargine (lantus) last given in 2019. Please refill if appropriate.

## 2019-10-08 NOTE — Telephone Encounter (Signed)
What is the name of the medication?Insulin Glargine (LANTUS SOLOSTAR) 100 UNIT/ML Solostar Pen [id number]    Have you contacted your pharmacy to request a refill? Yes , pharmacy reached out   Which pharmacy would you like this sent to?  [facility], [address] [place], [place] [address]    Patient notified that their request is being sent to the clinical staff for review and that they should receive a call once it is complete. If they do not receive a call within 72 hours they can check with their pharmacy or our office.

## 2019-10-09 ENCOUNTER — Other Ambulatory Visit: Payer: Self-pay | Admitting: Family Medicine

## 2019-10-09 MED ORDER — MOMETASONE FUROATE 50 MCG/ACT NA SUSP: NASAL | 12 refills | Status: DC

## 2019-10-09 MED ORDER — LANTUS SOLOSTAR 100 UNIT/ML ~~LOC~~ SOPN: 20.0000 [IU] | PEN_INJECTOR | SUBCUTANEOUS | 5 refills | Status: DC

## 2019-10-09 NOTE — Telephone Encounter (Signed)
Requested medication (s) are due for refill today: Yes  Requested medication (s) are on the active medication list: Yes  Last refill:  07/19/18  Future visit scheduled: Yes  Notes to clinic:  Prescription has expired.    Requested Prescriptions  Pending Prescriptions Disp Refills   mometasone (NASONEX) 50 MCG/ACT nasal spray 17 g 12    Sig: 2 sprays each nostril daily      Ear, Nose, and Throat: Nasal Preparations - Corticosteroids Passed - 10/09/2019 10:27 AM      Passed - Valid encounter within last 12 months    Recent Outpatient Visits           2 weeks ago Vaccine counseling   [facility] at [place], Lilia Argue, MD   1 month ago Type 2 diabetes mellitus with hyperglycemia, with long-term current use of insulin [facility])   [facility], Lilia Argue, MD   2 months ago Secondary osteoporosis   [facility] at [facility], Lilia Argue, MD   4 months ago Type 2 diabetes mellitus with hyperglycemia, with long-term current use of insulin [facility])   [facility] at [facility], Lilia Argue, MD   7 months ago Type 2 diabetes mellitus with hyperglycemia, with long-term current use of insulin [facility])   [facility] at [facility], Lilia Argue, MD       Future Appointments             In 1 month Rutherford Guys, MD Primary Care at [place], [facility]

## 2019-10-09 NOTE — Telephone Encounter (Signed)
Patient last OV with me was for Dm2 was Jan 2021

## 2019-10-09 NOTE — Telephone Encounter (Signed)
Copied from [place] [id number]. Topic: Quick Communication - Rx Refill/Question >> Oct 09, 2019 10:20 AM Leward Quan A wrote: Medication: mometasone (NASONEX) 50 MCG/ACT nasal spray   Has the patient contacted their pharmacy? Yes.   (Agent: If no, request that the patient contact the pharmacy for the refill.) (Agent: If yes, when and what did the pharmacy advise?)  Preferred Pharmacy (with phone number or street name): [facility], [place]  Phone:  [phone] Fax:  [phone]     Agent: Please be advised that RX refills may take up to 3 business days. We ask that you follow-up with your pharmacy.

## 2019-10-28 ENCOUNTER — Ambulatory Visit: Payer: Medicare Other | Attending: Internal Medicine

## 2019-10-28 DIAGNOSIS — Z23 Encounter for immunization: Secondary | ICD-10-CM

## 2019-10-28 NOTE — Progress Notes (Signed)
   Covid-19 Vaccination Clinic  Name:  Alyssa Weaver    MRN: [id number] DOB: [birth year]  10/28/2019  [name] was observed post Covid-19 immunization for 15 minutes without incident. She was provided with Vaccine Information Sheet and instruction to access the V-Safe system.   [name] was instructed to call 911 with any severe reactions post vaccine: Marland Kitchen Difficulty breathing  . Swelling of face and throat  . A fast heartbeat  . A bad rash all over body  . Dizziness and weakness   Immunizations Administered    Name Date Dose VIS Date Route   Pfizer COVID-19 Vaccine 10/28/2019  8:58 AM 0.3 mL 07/11/2019 Intramuscular   Manufacturer: [place]   Lot: [id number]   [place]: [id number]

## 2019-11-10 ENCOUNTER — Other Ambulatory Visit: Payer: Self-pay

## 2019-11-10 ENCOUNTER — Ambulatory Visit (INDEPENDENT_AMBULATORY_CARE_PROVIDER_SITE_OTHER): Payer: Medicare Other | Admitting: Registered Nurse

## 2019-11-10 ENCOUNTER — Ambulatory Visit: Payer: Medicare Other | Admitting: Family Medicine

## 2019-11-10 ENCOUNTER — Encounter: Payer: Self-pay | Admitting: Registered Nurse

## 2019-11-10 VITALS — BP 119/71 | HR 83 | Temp 98.2°F | Ht 63.0 in | Wt 142.2 lb

## 2019-11-10 DIAGNOSIS — Z1211 Encounter for screening for malignant neoplasm of colon: Secondary | ICD-10-CM

## 2019-11-10 DIAGNOSIS — J302 Other seasonal allergic rhinitis: Secondary | ICD-10-CM | POA: Diagnosis not present

## 2019-11-10 DIAGNOSIS — E1165 Type 2 diabetes mellitus with hyperglycemia: Secondary | ICD-10-CM

## 2019-11-10 DIAGNOSIS — Z794 Long term (current) use of insulin: Secondary | ICD-10-CM | POA: Diagnosis not present

## 2019-11-10 LAB — POCT GLYCOSYLATED HEMOGLOBIN (HGB A1C): Hemoglobin A1C: 8 % — AB (ref 4.0–5.6)

## 2019-11-10 LAB — GLUCOSE, POCT (MANUAL RESULT ENTRY): POC Glucose: 284 mg/dl — AB (ref 70–99)

## 2019-11-10 MED ORDER — CETIRIZINE HCL 10 MG PO TABS: 10.0000 mg | ORAL_TABLET | Freq: Every day | ORAL | 11 refills | Status: DC

## 2019-11-10 NOTE — Patient Instructions (Signed)
° ° ° °  If you have lab work done today you will be contacted with your lab results within the next 2 weeks.  If you have not heard from us then please contact us. The fastest way to get your results is to register for My Chart. ° ° °IF you received an x-ray today, you will receive an invoice from [facility]. Please contact [facility] at [phone] with questions or concerns regarding your invoice.  ° °IF you received labwork today, you will receive an invoice from [facility]. Please contact [facility] at [phone] with questions or concerns regarding your invoice.  ° °Our billing staff will not be able to assist you with questions regarding bills from these companies. ° °You will be contacted with the lab results as soon as they are available. The fastest way to get your results is to activate your My Chart account. Instructions are located on the last page of this paperwork. If you have not heard from us regarding the results in 2 weeks, please contact this office. °  ° ° ° °

## 2019-11-12 ENCOUNTER — Encounter: Payer: Self-pay | Admitting: Gastroenterology

## 2019-11-13 NOTE — Progress Notes (Signed)
Established Patient Office Visit  Subjective:  Patient ID: Alyssa Weaver, female    DOB: January 19, 1951  Age: 69 y.o. MRN: [id number]  CC:  Chief Complaint  Patient presents with  . Follow-up    follow up on her medical conditions check her labs for diabetes   . Medication Refill    Zyrtec    HPI Alyssa Weaver presents for t2dm check and refill on zyrtec  Feels well overall. Acknowledges she could improve from a lifestyle perspective, but endorses adherence to medication.  No complaints about further complications from E0EM. No other refills needed at this time.  Past Medical History:  Diagnosis Date  . Diabetes mellitus without complication ([place])   . Hyperlipidemia     Past Surgical History:  Procedure Laterality Date  . TUBAL LIGATION  1985    Family History  Problem Relation Age of Onset  . Asthma Brother   . Thyroid disease Neg Hx   . Osteoporosis Neg Hx     Social History   Socioeconomic History  . Marital status: Married    Spouse name: [name]  . Number of children: 5  . Years of education: 55  . Highest education level: Not on file  Occupational History  . Occupation: housewife  Tobacco Use  . Smoking status: Current Every Day Smoker    Packs/day: 0.75    Years: 43.00    Pack years: 32.25    Types: Cigarettes  . Smokeless tobacco: Never Used  . Tobacco comment: Husband smokes 1 ppd.  He might be willing to quit at same time.  Substance and Sexual Activity  . Alcohol use: No  . Drug use: No  . Sexual activity: Yes    Birth control/protection: Post-menopausal  Other Topics Concern  . Not on file  Social History Narrative   Originally from [place]   Refugees--lived in [place] 2 years.   Came to the [place] In [place].   Lived in the [place] until moving to [facility] Nov. 2016   Social Determinants of Health   Financial Resource Strain:   . Difficulty of Paying Living Expenses:   Food Insecurity:   . Worried About Charity fundraiser in the  Last Year:   . Arboriculturist in the Last Year:   Transportation Needs:   . Film/video editor (Medical):   Marland Kitchen Lack of Transportation (Non-Medical):   Physical Activity:   . Days of Exercise per Week:   . Minutes of Exercise per Session:   Stress:   . Feeling of Stress :   Social Connections:   . Frequency of Communication with Friends and Family:   . Frequency of Social Gatherings with Friends and Family:   . Attends Religious Services:   . Active Member of Clubs or Organizations:   . Attends Archivist Meetings:   Marland Kitchen Marital Status:   Intimate Partner Violence:   . Fear of Current or Ex-Partner:   . Emotionally Abused:   Marland Kitchen Physically Abused:   . Sexually Abused:     Outpatient Medications Prior to Visit  Medication Sig Dispense Refill  . atorvastatin (LIPITOR) 80 MG tablet Take 1 tablet (80 mg total) by mouth daily. 90 tablet 1  . Blood Glucose Monitoring Suppl (AGAMATRIX PRESTO) w/Device KIT Twice daily sugar checks before meals 1 kit 0  . Calcium Citrate-Vitamin D (CITRACAL/VITAMIN D) 250-200 MG-UNIT TABS 2 tabs by mouth twice daily 120 each   . glucose blood (ONE TOUCH ULTRA TEST) test  strip Use to check CBGs qam fasting, 2 hrs postprandial, & if having hyper or hypoglycemic sxs. Dx E11.65, Z79.4 400 each 4  . ibandronate (BONIVA) 150 MG tablet Take 1 tablet (150 mg total) by mouth every 30 (thirty) days. Take in the morning with a full glass of water, on an empty stomach, and do not take anything else by mouth or lie down for the next 30 min. 3 tablet 3  . ibuprofen (ADVIL,MOTRIN) 200 MG tablet Take 200 mg by mouth every 6 (six) hours as needed.    . insulin glargine (LANTUS SOLOSTAR) 100 UNIT/ML Solostar Pen Inject 20 Units into the skin every morning. 5 pen 5  . latanoprost (XALATAN) 0.005 % ophthalmic solution INSTILL 1 DROP INTO EACH EYE ONCE DAILY IN THE EVENING    . metFORMIN (GLUCOPHAGE) 1000 MG tablet TAKE 1 TABLET BY MOUTH TWICE DAILY WITH A MEAL 180  tablet 0  . methimazole (TAPAZOLE) 5 MG tablet Take 0.5 tablets (2.5 mg total) by mouth daily. 45 tablet 1  . mometasone (NASONEX) 50 MCG/ACT nasal spray 2 sprays each nostril daily 17 g 12  . pantoprazole (PROTONIX) 20 MG tablet Take 1 tablet (20 mg total) by mouth daily as needed for heartburn. 30 tablet 3  . TRULICITY 6.23 JS/2.8BT SOPN INJECT 0.75MG INTO THE SKIN ONCE WEEK 12 mL 0  . cetirizine (ZYRTEC) 10 MG tablet Take 1 tablet by mouth once daily 30 tablet 0   No facility-administered medications prior to visit.    No Known Allergies  ROS Review of Systems  Constitutional: Negative.   HENT: Negative.   Eyes: Negative.   Respiratory: Negative.   Cardiovascular: Negative.   Gastrointestinal: Negative.   Endocrine: Negative.   Genitourinary: Negative.   Musculoskeletal: Negative.   Skin: Negative.   Allergic/Immunologic: Negative.   Neurological: Negative.   Hematological: Negative.   Psychiatric/Behavioral: Negative.   All other systems reviewed and are negative.     Objective:    Physical Exam  Constitutional: She is oriented to person, place, and time. She appears well-developed and well-nourished. No distress.  Cardiovascular: Normal rate, regular rhythm and normal heart sounds. Exam reveals no gallop and no friction rub.  No murmur heard. Pulmonary/Chest: Effort normal and breath sounds normal. No respiratory distress. She has no wheezes. She has no rales. She exhibits no tenderness.  Neurological: She is alert and oriented to person, place, and time. No cranial nerve deficit.  Skin: Skin is warm and dry. No rash noted. She is not diaphoretic. No erythema. No pallor.  Psychiatric: She has a normal mood and affect. Her behavior is normal. Judgment and thought content normal.  Nursing note and vitals reviewed.   BP 119/71   Pulse 83   Temp 98.2 F (36.8 C) (Temporal)   Ht '5\' 3"'  (1.6 m)   Wt 142 lb 3.2 oz (64.5 kg)   SpO2 96%   BMI 25.19 kg/m  Wt Readings  from Last 3 Encounters:  11/10/19 142 lb 3.2 oz (64.5 kg)  08/18/19 140 lb (63.5 kg)  07/17/19 139 lb 3.2 oz (63.1 kg)     Health Maintenance Due  Topic Date Due  . COLONOSCOPY  Never done    There are no preventive care reminders to display for this patient.  Lab Results  Component Value Date   TSH 0.18 (L) 07/01/2019   Lab Results  Component Value Date   WBC 6.2 03/06/2018   HGB 12.6 03/06/2018   HCT 40.8 03/06/2018  MCV 84 03/06/2018   PLT 211 03/06/2018   Lab Results  Component Value Date   NA 140 08/18/2019   K 4.6 08/18/2019   CO2 25 08/18/2019   GLUCOSE 196 (H) 08/18/2019   BUN 14 08/18/2019   CREATININE 0.76 08/18/2019   BILITOT 0.2 08/18/2019   ALKPHOS 55 08/18/2019   AST 22 08/18/2019   ALT 23 08/18/2019   PROT 6.9 08/18/2019   ALBUMIN 4.6 08/18/2019   CALCIUM 9.3 08/18/2019   Lab Results  Component Value Date   CHOL 100 08/18/2019   Lab Results  Component Value Date   HDL 35 (L) 08/18/2019   Lab Results  Component Value Date   LDLCALC 36 08/18/2019   Lab Results  Component Value Date   TRIG 177 (H) 08/18/2019   Lab Results  Component Value Date   CHOLHDL 2.9 08/18/2019   Lab Results  Component Value Date   HGBA1C 8.0 (A) 11/10/2019      Assessment & Plan:   Problem List Items Addressed This Visit      Endocrine   Type 2 diabetes mellitus with hyperglycemia, with long-term current use of insulin (HCC) (Chronic)   Relevant Orders   POCT glycosylated hemoglobin (Hb A1C) (Completed)   POCT glucose (manual entry) (Completed)    Other Visit Diagnoses    Screening for colon cancer    -  Primary   Relevant Orders   Ambulatory referral to Gastroenterology   Special screening for malignant neoplasms, colon       Relevant Orders   Ambulatory referral to Gastroenterology   Seasonal allergies       Relevant Medications   cetirizine (ZYRTEC) 10 MG tablet      Meds ordered this encounter  Medications  . cetirizine (ZYRTEC) 10  MG tablet    Sig: Take 1 tablet (10 mg total) by mouth daily.    Dispense:  30 tablet    Refill:  11    Follow-up: Return in about 10 weeks (around 01/19/2020) for t2dm check with pcp.   PLAN  a1c up to 8.0  Pt wants to return sooner than 3 months to ensure adequate control  Agreed to avoid increase in medication at this time and she will increase activity and better control diet.  Referral sent for colonoscopy  Patient encouraged to call clinic with any questions, comments, or concerns.  Maximiano Coss, NP

## 2019-11-19 LAB — MICROALBUMIN, URINE: Microalb, Ur: NEGATIVE

## 2019-11-22 ENCOUNTER — Other Ambulatory Visit: Payer: Self-pay | Admitting: Family Medicine

## 2019-11-22 DIAGNOSIS — E1165 Type 2 diabetes mellitus with hyperglycemia: Secondary | ICD-10-CM

## 2019-11-22 DIAGNOSIS — Z794 Long term (current) use of insulin: Secondary | ICD-10-CM

## 2019-11-22 NOTE — Telephone Encounter (Signed)
Requested Prescriptions  Pending Prescriptions Disp Refills  . metFORMIN (GLUCOPHAGE) 1000 MG tablet [Pharmacy Med Name: metFORMIN HCl 1000 MG Oral Tablet] 180 tablet 0    Sig: TAKE 1 TABLET BY MOUTH TWICE DAILY WITH A MEAL     Endocrinology:  Diabetes - Biguanides Failed - 11/22/2019 10:50 AM      Failed - HBA1C is between 0 and 7.9 and within 180 days    Hemoglobin A1C  Date Value Ref Range Status  11/10/2019 8.0 (A) 4.0 - 5.6 % Final   Hgb A1c MFr Bld  Date Value Ref Range Status  03/06/2018 8.6 (H) 4.8 - 5.6 % Final    Comment:             Prediabetes: 5.7 - 6.4          Diabetes: >6.4          Glycemic control for adults with diabetes: <7.0          Passed - Cr in normal range and within 360 days    Creatinine, Ser  Date Value Ref Range Status  08/18/2019 0.76 0.57 - 1.00 mg/dL Final         Passed - eGFR in normal range and within 360 days    GFR calc Af Amer  Date Value Ref Range Status  08/18/2019 94 >59 mL/min/1.73 Final   GFR calc non Af Amer  Date Value Ref Range Status  08/18/2019 81 >59 mL/min/1.73 Final         Passed - Valid encounter within last 6 months    Recent Outpatient Visits          1 week ago Screening for colon cancer   [facility], Richard, NP   1 month ago Vaccine counseling   [facility] Santiago, Irma M, MD   3 months ago Type 2 diabetes mellitus with hyperglycemia, with long-term current use of insulin (HCC)   [facility] Santiago, Irma M, MD   4 months ago Secondary osteoporosis   [facility] Santiago, Irma M, MD   6 months ago Type 2 diabetes mellitus with hyperglycemia, with long-term current use of insulin (HCC)   [facility] Santiago, Irma M, MD      Future Appointments            In 2 months Santiago, Irma M, MD [facility]             

## 2019-12-01 ENCOUNTER — Other Ambulatory Visit: Payer: Self-pay

## 2019-12-01 ENCOUNTER — Ambulatory Visit (AMBULATORY_SURGERY_CENTER): Payer: Self-pay | Admitting: *Deleted

## 2019-12-01 VITALS — Temp 97.3°F | Ht 63.0 in | Wt 140.5 lb

## 2019-12-01 DIAGNOSIS — Z1211 Encounter for screening for malignant neoplasm of colon: Secondary | ICD-10-CM

## 2019-12-01 NOTE — Progress Notes (Signed)
Previsit completed with interpreter [place]. Pt reports having several colonoscopies in [place].  "all normal" No egg or soy allergy known to patient  No issues with past sedation with any surgeries  or procedures, no intubation problems  No diet pills per patient No home 02 use per patient  No blood thinners per patient  Pt denies issues with constipation  No A fib or A flutter  EMMI video sent to pt's e mail   Due to the COVID-19 pandemic we are asking patients to follow these guidelines. Please only bring one care partner. Please be aware that your care partner may wait in the car in the parking lot or if they feel like they will be too hot to wait in the car, they may wait in the lobby on the 4th floor. All care partners are required to wear a mask the entire time (we do not have any that we can provide them), they need to practice social distancing, and we will do a Covid check for all patient's and care partners when you arrive. Also we will check their temperature and your temperature. If the care partner waits in their car they need to stay in the parking lot the entire time and we will call them on their cell phone when the patient is ready for discharge so they can bring the car to the front of the building. Also all patient's will need to wear a mask into building.

## 2019-12-10 ENCOUNTER — Encounter: Payer: Self-pay | Admitting: Gastroenterology

## 2019-12-16 ENCOUNTER — Encounter: Payer: Self-pay | Admitting: Gastroenterology

## 2019-12-16 ENCOUNTER — Ambulatory Visit (AMBULATORY_SURGERY_CENTER): Payer: Medicare Other | Admitting: Gastroenterology

## 2019-12-16 ENCOUNTER — Other Ambulatory Visit: Payer: Self-pay

## 2019-12-16 VITALS — BP 123/54 | HR 64 | Temp 96.6°F | Resp 18 | Ht 63.0 in | Wt 140.0 lb

## 2019-12-16 DIAGNOSIS — Z1211 Encounter for screening for malignant neoplasm of colon: Secondary | ICD-10-CM

## 2019-12-16 DIAGNOSIS — Z538 Procedure and treatment not carried out for other reasons: Secondary | ICD-10-CM

## 2019-12-16 MED ORDER — SODIUM CHLORIDE 0.9 % IV SOLN: 500.0000 mL | Freq: Once | INTRAVENOUS | Status: DC

## 2019-12-16 MED ORDER — SUTAB 1479-225-188 MG PO TABS: 1.0000 | ORAL_TABLET | Freq: Once | ORAL | 0 refills | Status: AC

## 2019-12-16 NOTE — Progress Notes (Signed)
A/ox3, pleased with MAC, report to RN 

## 2019-12-16 NOTE — Op Note (Signed)
[facility] Patient Name: Alyssa Weaver Procedure Date: 12/16/2019 9:11 AM MRN: [id number] Endoscopist: Mauri Pole , MD Age: 69 Referring MD:  Date of Birth: Sep 01, 1950 Gender: Female Account #: [id number] Procedure:                Colonoscopy Indications:              Screening for colorectal malignant neoplasm Medicines:                Monitored Anesthesia Care Procedure:                Pre-Anesthesia Assessment:                           - Prior to the procedure, a History and Physical                            was performed, and patient medications and                            allergies were reviewed. The patient's tolerance of                            previous anesthesia was also reviewed. The risks                            and benefits of the procedure and the sedation                            options and risks were discussed with the patient.                            All questions were answered, and informed consent                            was obtained. Prior Anticoagulants: The patient has                            taken no previous anticoagulant or antiplatelet                            agents. ASA Grade Assessment: II - A patient with                            mild systemic disease. After reviewing the risks                            and benefits, the patient was deemed in                            satisfactory condition to undergo the procedure.                           After obtaining informed consent, the colonoscope  was passed under direct vision. Throughout the                            procedure, the patient's blood pressure, pulse, and                            oxygen saturations were monitored continuously. The                            Colonoscope was introduced through the anus with                            the intention of advancing to the cecum. The scope                            was  advanced to the sigmoid colon before the                            procedure was aborted. Medications were given. The                            patient tolerated the procedure well. The quality                            of the bowel preparation was poor. The colonoscopy                            was somewhat difficult due to poor endoscopic                            visualization. Successful completion of the                            procedure was aided by lavage. Scope In: 9:19:00 AM Scope Out: 9:23:08 AM Total Procedure Duration: 0 hours 4 minutes 8 seconds  Findings:                 The perianal and digital rectal examinations were                            normal.                           A moderate amount of stool was found in the sigmoid                            colon, interfering with visualization. Lavage of                            the area was performed, resulting in incomplete                            clearance with continued poor visualization. Complications:            No immediate  complications. Estimated Blood Loss:     Estimated blood loss: none. Impression:               - Preparation of the colon was poor.                           - Stool in the sigmoid colon.                           - No specimens collected. Recommendation:           - Patient has a contact number available for                            emergencies. The signs and symptoms of potential                            delayed complications were discussed with the                            patient. Return to normal activities tomorrow.                            Written discharge instructions were provided to the                            patient.                           - Resume previous diet.                           - Continue present medications.                           - Repeat colonoscopy at the next available                            appointment because the bowel preparation  was                            suboptimal. Mauri Pole, MD 12/16/2019 9:28:34 AM This report has been signed electronically.

## 2019-12-16 NOTE — Progress Notes (Signed)
Colonoscopy appt made for 01-05-20 at 800.  Pt has had both doses of the covid vaccine.  2 day Sutab/Miralax prep instructions reviewed with husband and all questions answered.  Instructions clarified- told to arrive at 7:00 am for procedure

## 2019-12-16 NOTE — Progress Notes (Signed)
Pt's states no medical or surgical changes since previsit or office visit. 

## 2019-12-16 NOTE — Patient Instructions (Signed)
YOU HAD AN ENDOSCOPIC PROCEDURE TODAY AT THE [facility]:   Refer to the procedure report that was given to you for any specific questions about what was found during the examination.  If the procedure report does not answer your questions, please call your gastroenterologist to clarify.  If you requested that your care partner not be given the details of your procedure findings, then the procedure report has been included in a sealed envelope for you to review at your convenience later. ° °YOU SHOULD EXPECT: Some feelings of bloating in the abdomen. Passage of more gas than usual.  Walking can help get rid of the air that was put into your GI tract during the procedure and reduce the bloating. If you had a lower endoscopy (such as a colonoscopy or flexible sigmoidoscopy) you may notice spotting of blood in your stool or on the toilet paper. If you underwent a bowel prep for your procedure, you may not have a normal bowel movement for a few days. ° °Please Note:  You might notice some irritation and congestion in your nose or some drainage.  This is from the oxygen used during your procedure.  There is no need for concern and it should clear up in a day or so. ° °SYMPTOMS TO REPORT IMMEDIATELY: ° °Following lower endoscopy (colonoscopy or flexible sigmoidoscopy): ° Excessive amounts of blood in the stool ° Significant tenderness or worsening of abdominal pains ° Swelling of the abdomen that is new, acute ° Fever of 100°F or higher ° °For urgent or emergent issues, a gastroenterologist can be reached at any hour by calling [phone]. °Do not use MyChart messaging for urgent concerns.  ° ° °DIET:  We do recommend a small meal at first, but then you may proceed to your regular diet.  Drink plenty of fluids but you should avoid alcoholic beverages for 24 hours. ° °ACTIVITY:  You should plan to take it easy for the rest of today and you should NOT DRIVE or use heavy machinery until tomorrow (because of  the sedation medicines used during the test).   ° °FOLLOW UP: °Our staff will call the number listed on your records 48-72 hours following your procedure to check on you and address any questions or concerns that you may have regarding the information given to you following your procedure. If we do not reach you, we will leave a message.  We will attempt to reach you two times.  During this call, we will ask if you have developed any symptoms of COVID 19. If you develop any symptoms (ie: fever, flu-like symptoms, shortness of breath, cough etc.) before then, please call [phone].  If you test positive for Covid 19 in the 2 weeks post procedure, please call and report this information to us.   ° °SIGNATURES/CONFIDENTIALITY: °You and/or your care partner have signed paperwork which will be entered into your electronic medical record.  These signatures attest to the fact that that the information above on your After Visit Summary has been reviewed and is understood.  Full responsibility of the confidentiality of this discharge information lies with you and/or your care-partner.  °

## 2019-12-18 ENCOUNTER — Telehealth: Payer: Self-pay | Admitting: *Deleted

## 2019-12-18 ENCOUNTER — Telehealth: Payer: Self-pay

## 2019-12-18 NOTE — Telephone Encounter (Signed)
NO ANSWER, MESSAGE LEFT FOR PATIENT. 

## 2019-12-18 NOTE — Telephone Encounter (Signed)
  Follow up Call-  Call back number 12/16/2019  Post procedure Call Back phone  # [phone]  Permission to leave phone message Yes  Some recent data might be hidden     Patient questions:  Message left to call us if necessary.  Second call.

## 2019-12-31 ENCOUNTER — Telehealth: Payer: Self-pay | Admitting: Gastroenterology

## 2019-12-31 NOTE — Telephone Encounter (Signed)
Pain in the right side off and on. The daughter states this has been going on for a long time. She is unsure about repeating the colonoscopy if she has the pain out of a fear that it will make it worse. Patient was scheduled for a screening colonoscopy directly. Encouraged her to discuss this with her PCP. Admits to having issues most of her life with constipation. She drinks prune juice as her remedy. No rectal blood, fever or nausea. She will let us know if she cannot go through with the colonoscopy.

## 2020-01-01 NOTE — Telephone Encounter (Signed)
Patient needs colonoscopy for evaluation of the pain and CRC screening, last colonoscopy poor prep unable to complete the exam. She will need extended prep. If patient is willing add Miralax 1 capful daily to prune juice to improve constipation. Schedule office visit next available with APP if patient has further questions. Thanks

## 2020-01-02 ENCOUNTER — Other Ambulatory Visit: Payer: Self-pay | Admitting: Family Medicine

## 2020-01-02 DIAGNOSIS — E782 Mixed hyperlipidemia: Secondary | ICD-10-CM

## 2020-01-02 NOTE — Telephone Encounter (Signed)
Spoke with the patient's daughter. She will give this information to her mother. Agrees to go forward with the planned colonoscopy. She states she has been following the instructions.

## 2020-01-02 NOTE — Telephone Encounter (Signed)
Requested medication (s) are due for refill today: Yes  Requested medication (s) are on the active medication list: Yes  Last refill:  11/25/18  Future visit scheduled: Yes  Notes to clinic:  Prescription has expired.    Requested Prescriptions  Pending Prescriptions Disp Refills   atorvastatin (LIPITOR) 80 MG tablet [Pharmacy Med Name: Atorvastatin Calcium 80 MG Oral Tablet] 90 tablet 0    Sig: Take 1 tablet by mouth once daily      Cardiovascular:  Antilipid - Statins Failed - 01/02/2020 10:48 AM      Failed - HDL in normal range and within 360 days    HDL  Date Value Ref Range Status  08/18/2019 35 (L) >39 mg/dL Final          Failed - Triglycerides in normal range and within 360 days    Triglycerides  Date Value Ref Range Status  08/18/2019 177 (H) 0 - 149 mg/dL Final          Passed - Total Cholesterol in normal range and within 360 days    Cholesterol, Total  Date Value Ref Range Status  08/18/2019 100 100 - 199 mg/dL Final          Passed - LDL in normal range and within 360 days    LDL Chol Calc (NIH)  Date Value Ref Range Status  08/18/2019 36 0 - 99 mg/dL Final          Passed - Patient is not pregnant      Passed - Valid encounter within last 12 months    Recent Outpatient Visits           1 month ago Screening for colon cancer   [facility], Delfino Lovett, NP   3 months ago Vaccine counseling   [facility], Lilia Argue, MD   4 months ago Type 2 diabetes mellitus with hyperglycemia, with long-term current use of insulin [facility])   [facility], Lilia Argue, MD   5 months ago Secondary osteoporosis   [facility], Lilia Argue, MD   7 months ago Type 2 diabetes mellitus with hyperglycemia, with long-term current use of insulin [facility])   [facility], Lilia Argue, MD       Future Appointments             In 3 weeks Rutherford Guys, MD [facility] [place], [facility]              Signed Prescriptions Disp Refills   TRULICITY 4.80 XK/5.5VZ SOPN 12 mL 0    Sig: INJECT 0.75MG  INTO THE SKIN ONCE A WEEK      Endocrinology:  Diabetes - GLP-1 Receptor Agonists Failed - 01/02/2020 10:48 AM      Failed - HBA1C is between 0 and 7.9 and within 180 days    Hemoglobin A1C  Date Value Ref Range Status  11/10/2019 8.0 (A) 4.0 - 5.6 % Final   Hgb A1c MFr Bld  Date Value Ref Range Status  03/06/2018 8.6 (H) 4.8 - 5.6 % Final    Comment:             Prediabetes: 5.7 - 6.4          Diabetes: >6.4          Glycemic control for adults with diabetes: <7.0           Passed - Valid encounter within last  6 months    Recent Outpatient Visits           1 month ago Screening for colon cancer   [facility], NP   3 months ago Vaccine counseling   [facility], Lilia Argue, MD   4 months ago Type 2 diabetes mellitus with hyperglycemia, with long-term current use of insulin [facility])   [facility], Lilia Argue, MD   5 months ago Secondary osteoporosis   [facility], Lilia Argue, MD   7 months ago Type 2 diabetes mellitus with hyperglycemia, with long-term current use of insulin [facility])   [facility], Lilia Argue, MD       Future Appointments             In 3 weeks Rutherford Guys, MD [facility] [place], [facility]

## 2020-01-05 ENCOUNTER — Other Ambulatory Visit: Payer: Self-pay

## 2020-01-05 ENCOUNTER — Encounter: Payer: Self-pay | Admitting: Gastroenterology

## 2020-01-05 ENCOUNTER — Ambulatory Visit (AMBULATORY_SURGERY_CENTER): Payer: Medicare Other | Admitting: Gastroenterology

## 2020-01-05 VITALS — BP 137/77 | HR 71 | Temp 96.8°F | Resp 13 | Ht 63.0 in | Wt 140.0 lb

## 2020-01-05 DIAGNOSIS — D123 Benign neoplasm of transverse colon: Secondary | ICD-10-CM

## 2020-01-05 DIAGNOSIS — Z1211 Encounter for screening for malignant neoplasm of colon: Secondary | ICD-10-CM | POA: Diagnosis not present

## 2020-01-05 MED ORDER — SODIUM CHLORIDE 0.9 % IV SOLN: 500.0000 mL | Freq: Once | INTRAVENOUS | Status: DC

## 2020-01-05 MED ORDER — FLEET ENEMA 7-19 GM/118ML RE ENEM
1.0000 | ENEMA | Freq: Once | RECTAL | Status: AC
  Administered 2020-01-05: 1 via RECTAL

## 2020-01-05 NOTE — Progress Notes (Signed)
Report given to PACU, vss 

## 2020-01-05 NOTE — Progress Notes (Signed)
Called to room to assist during endoscopic procedure.  Patient ID and intended procedure confirmed with present staff. Received instructions for my participation in the procedure from the performing physician.  

## 2020-01-05 NOTE — Progress Notes (Addendum)
JB - check-in DT - VS  Pt speaks [place] and has an Astronomer, [place].  maw

## 2020-01-05 NOTE — Patient Instructions (Signed)
Handouts provided on polyps and hemorrhoids.   YOU HAD AN ENDOSCOPIC PROCEDURE TODAY AT THE [facility]:   Refer to the procedure report that was given to you for any specific questions about what was found during the examination.  If the procedure report does not answer your questions, please call your gastroenterologist to clarify.  If you requested that your care partner not be given the details of your procedure findings, then the procedure report has been included in a sealed envelope for you to review at your convenience later.  YOU SHOULD EXPECT: Some feelings of bloating in the abdomen. Passage of more gas than usual.  Walking can help get rid of the air that was put into your GI tract during the procedure and reduce the bloating. If you had a lower endoscopy (such as a colonoscopy or flexible sigmoidoscopy) you may notice spotting of blood in your stool or on the toilet paper. If you underwent a bowel prep for your procedure, you may not have a normal bowel movement for a few days.  Please Note:  You might notice some irritation and congestion in your nose or some drainage.  This is from the oxygen used during your procedure.  There is no need for concern and it should clear up in a day or so.  SYMPTOMS TO REPORT IMMEDIATELY:  Following lower endoscopy (colonoscopy or flexible sigmoidoscopy):  Excessive amounts of blood in the stool  Significant tenderness or worsening of abdominal pains  Swelling of the abdomen that is new, acute  Fever of 100F or higher  For urgent or emergent issues, a gastroenterologist can be reached at any hour by calling [phone]. Do not use MyChart messaging for urgent concerns.    DIET:  We do recommend a small meal at first, but then you may proceed to your regular diet.  Drink plenty of fluids but you should avoid alcoholic beverages for 24 hours.  ACTIVITY:  You should plan to take it easy for the rest of today and you should NOT DRIVE  or use heavy machinery until tomorrow (because of the sedation medicines used during the test).    FOLLOW UP: Our staff will call the number listed on your records 48-72 hours following your procedure to check on you and address any questions or concerns that you may have regarding the information given to you following your procedure. If we do not reach you, we will leave a message.  We will attempt to reach you two times.  During this call, we will ask if you have developed any symptoms of COVID 19. If you develop any symptoms (ie: fever, flu-like symptoms, shortness of breath, cough etc.) before then, please call [phone].  If you test positive for Covid 19 in the 2 weeks post procedure, please call and report this information to us.    If any biopsies were taken you will be contacted by phone or by letter within the next 1-3 weeks.  Please call us at [phone] if you have not heard about the biopsies in 3 weeks.    SIGNATURES/CONFIDENTIALITY: You and/or your care partner have signed paperwork which will be entered into your electronic medical record.  These signatures attest to the fact that that the information above on your After Visit Summary has been reviewed and is understood.  Full responsibility of the confidentiality of this discharge information lies with you and/or your care-partner.  

## 2020-01-05 NOTE — Op Note (Signed)
Agency Patient Name: Alyssa Weaver Procedure Date: 01/05/2020 7:58 AM MRN: [id number] Endoscopist: Mauri Pole , MD Age: 69 Referring MD:  Date of Birth: May 19, 1951 Gender: Female Account #: [id number] Procedure:                Colonoscopy Indications:              Screening for colorectal malignant neoplasm Medicines:                Monitored Anesthesia Care Procedure:                Pre-Anesthesia Assessment:                           - Prior to the procedure, a History and Physical                            was performed, and patient medications and                            allergies were reviewed. The patient's tolerance of                            previous anesthesia was also reviewed. The risks                            and benefits of the procedure and the sedation                            options and risks were discussed with the patient.                            All questions were answered, and informed consent                            was obtained. Prior Anticoagulants: The patient has                            taken no previous anticoagulant or antiplatelet                            agents. ASA Grade Assessment: II - A patient with                            mild systemic disease. After reviewing the risks                            and benefits, the patient was deemed in                            satisfactory condition to undergo the procedure.                           After obtaining informed consent, the colonoscope  was passed under direct vision. Throughout the                            procedure, the patient's blood pressure, pulse, and                            oxygen saturations were monitored continuously. The                            Colonoscope was introduced through the anus and                            advanced to the the cecum, identified by                            appendiceal orifice  and ileocecal valve. The                            colonoscopy was performed without difficulty. The                            patient tolerated the procedure well. The quality                            of the bowel preparation was excellent. The                            ileocecal valve, appendiceal orifice, and rectum                            were photographed. Scope In: 8:22:48 AM Scope Out: 8:39:39 AM Scope Withdrawal Time: 0 hours 9 minutes 25 seconds  Total Procedure Duration: 0 hours 16 minutes 51 seconds  Findings:                 The perianal and digital rectal examinations were                            normal.                           A less than 1 mm polyp was found in the transverse                            colon. The polyp was sessile. The polyp was removed                            with a cold biopsy forceps. Resection and retrieval                            were complete.                           Non-bleeding external and internal hemorrhoids were  found during retroflexion. The hemorrhoids were                            small.                           The exam was otherwise without abnormality. Complications:            No immediate complications. Estimated Blood Loss:     Estimated blood loss was minimal. Impression:               - One less than 1 mm polyp in the transverse colon,                            removed with a cold biopsy forceps. Resected and                            retrieved.                           - Non-bleeding external and internal hemorrhoids.                           - The examination was otherwise normal. Recommendation:           - Patient has a contact number available for                            emergencies. The signs and symptoms of potential                            delayed complications were discussed with the                            patient. Return to normal activities tomorrow.                             Written discharge instructions were provided to the                            patient.                           - Resume previous diet.                           - Continue present medications.                           - Await pathology results.                           - Repeat colonoscopy in 5-10 years for surveillance                            based on pathology results. Mauri Pole, MD 01/05/2020 8:46:30 AM This report has been signed electronically.

## 2020-01-05 NOTE — Progress Notes (Signed)
Interpreter used today at the [facility] for this pt.  Interpreter's name is-Simmaly.

## 2020-01-07 ENCOUNTER — Telehealth: Payer: Self-pay | Admitting: *Deleted

## 2020-01-07 NOTE — Telephone Encounter (Signed)
  Follow up Call-  Call back number 01/05/2020 12/16/2019  Post procedure Call Back phone  # [phone]  Permission to leave phone message Yes Yes  Some recent data might be hidden     Patient questions:  Do you have a fever, pain , or abdominal swelling? No. Pain Score  0 *  Have you tolerated food without any problems? Yes.    Have you been able to return to your normal activities? Yes.    Do you have any questions about your discharge instructions: Diet   No. Medications  No. Follow up visit  No.  Do you have questions or concerns about your Care? No.  Actions: * If pain score is 4 or above: 1. No action needed, pain <4.Have you developed a fever since your procedure? no  2.   Have you had an respiratory symptoms (SOB or cough) since your procedure? no  3.   Have you tested positive for COVID 19 since your procedure no  4.   Have you had any family members/close contacts diagnosed with the COVID 19 since your procedure?  no   If yes to any of these questions please route to Joylene John, RN and Erenest Rasher, RN

## 2020-01-13 ENCOUNTER — Encounter: Payer: Self-pay | Admitting: Gastroenterology

## 2020-01-26 ENCOUNTER — Other Ambulatory Visit: Payer: Self-pay

## 2020-01-26 ENCOUNTER — Encounter: Payer: Self-pay | Admitting: Family Medicine

## 2020-01-26 ENCOUNTER — Ambulatory Visit (INDEPENDENT_AMBULATORY_CARE_PROVIDER_SITE_OTHER): Payer: Medicare Other | Admitting: Family Medicine

## 2020-01-26 VITALS — BP 106/64 | HR 75 | Temp 97.6°F | Resp 15 | Ht 63.0 in | Wt 137.8 lb

## 2020-01-26 DIAGNOSIS — E782 Mixed hyperlipidemia: Secondary | ICD-10-CM | POA: Diagnosis not present

## 2020-01-26 DIAGNOSIS — Z794 Long term (current) use of insulin: Secondary | ICD-10-CM | POA: Diagnosis not present

## 2020-01-26 DIAGNOSIS — M546 Pain in thoracic spine: Secondary | ICD-10-CM | POA: Diagnosis not present

## 2020-01-26 DIAGNOSIS — E11649 Type 2 diabetes mellitus with hypoglycemia without coma: Secondary | ICD-10-CM

## 2020-01-26 LAB — POCT GLYCOSYLATED HEMOGLOBIN (HGB A1C): Hemoglobin A1C: 8.5 % — AB (ref 4.0–5.6)

## 2020-01-26 MED ORDER — ATORVASTATIN CALCIUM 80 MG PO TABS: 80.0000 mg | ORAL_TABLET | Freq: Every day | ORAL | 1 refills | Status: DC

## 2020-01-26 MED ORDER — CYCLOBENZAPRINE HCL 5 MG PO TABS
5.0000 mg | ORAL_TABLET | Freq: Three times a day (TID) | ORAL | 1 refills | Status: DC | PRN
Start: 2020-01-26 — End: 2023-07-11

## 2020-01-26 MED ORDER — LANTUS SOLOSTAR 100 UNIT/ML ~~LOC~~ SOPN: 15.0000 [IU] | PEN_INJECTOR | SUBCUTANEOUS | 5 refills | Status: DC

## 2020-01-26 NOTE — Patient Instructions (Addendum)
  Decrease lantus to 15 units a day Continue metformin and trulicity Cont checking blood glucose three times a day Next appointment in 2 weeks   If you have lab work done today you will be contacted with your lab results within the next 2 weeks.  If you have not heard from [place] then please contact us. The fastest way to get your results is to register for My Chart.   IF you received an x-ray today, you will receive an invoice from [facility]. Please contact [facility] at [phone] with questions or concerns regarding your invoice.   IF you received labwork today, you will receive an invoice from [facility]. Please contact [facility] at [phone] with questions or concerns regarding your invoice.   Our billing staff will not be able to assist you with questions regarding bills from these companies.  You will be contacted with the lab results as soon as they are available. The fastest way to get your results is to activate your My Chart account. Instructions are located on the last page of this paperwork. If you have not heard from [place] regarding the results in 2 weeks, please contact this office.

## 2020-01-26 NOTE — Progress Notes (Signed)
6/28/20212:31 PM  Alyssa Weaver [birth year], 69 y.o., female [id number]  Chief Complaint  Patient presents with  . Diabetes    pt has been doing well no concerns, pt has been working on cutting out some sugars to improve.     HPI:   Patient is a 69 y.o. female with past medical history significant for DM2 insulin dependent, HLP, subclinical hyperthyroidism/multinodular thyroid, seasonal allergieswho presents today for routine followup  Last OV jan 2021 - no changes  She is overall doing ok x for having ruq pain for past 3 days, radiates to her flank Not aggravated with movement or eating Worse with sitting No dysuria,hematuria, nausea, vomiting Reports normal BMs Had colonoscopy in June 2021 - + polyp, repeat in 7 years  She has had to hold metformin for past several days due to recurring lows Checking cbgs TID AC meals Brings in log, range 47-173 for past week, avg 100, with 3 fasting hypoglycemic readings lantus 20 units daily Metformin 6546TK BID trulicity 3.54 weekly  Her husband serves as interpreter per patient preference  Lab Results  Component Value Date   HGBA1C 8.0 (A) 11/10/2019   HGBA1C 7.6 (A) 08/18/2019   HGBA1C 8.6 (A) 05/20/2019   Lab Results  Component Value Date   MICROALBUR neg 11/19/2019   LDLCALC 36 08/18/2019   CREATININE 0.76 08/18/2019    Depression screen PHQ 2/9 01/26/2020 11/10/2019 09/25/2019  Decreased Interest 0 0 0  Down, Depressed, Hopeless 0 0 0  PHQ - 2 Score 0 0 0  Altered sleeping - - -  Tired, decreased energy - - -  Change in appetite - - -  Feeling bad or failure about yourself  - - -  Trouble concentrating - - -  Moving slowly or fidgety/restless - - -  Suicidal thoughts - - -  PHQ-9 Score - - -    Fall Risk  01/26/2020 11/10/2019 09/25/2019 08/18/2019 07/17/2019  Falls in the past year? 0 0 0 0 0  Number falls in past yr: - 0 0 0 0  Injury with Fall? - 0 0 0 0  Follow up Falls evaluation completed Falls  evaluation completed - - -     No Known Allergies  Prior to Admission medications   Medication Sig Start Date End Date Taking? Authorizing Provider  atorvastatin (LIPITOR) 80 MG tablet Take 1 tablet (80 mg total) by mouth daily. 11/25/18  Yes Rutherford Guys, MD  Blood Glucose Monitoring Suppl [facility] PRESTO) w/Device KIT Twice daily sugar checks before meals 04/18/17  Yes Mack Hook, MD  Calcium Citrate-Vitamin D (CITRACAL/VITAMIN D) 250-200 MG-UNIT TABS 2 tabs by mouth twice daily 08/24/17  Yes Mack Hook, MD  cetirizine (ZYRTEC) 10 MG tablet Take 1 tablet (10 mg total) by mouth daily. 11/10/19  Yes Maximiano Coss, NP  glucose blood (ONE TOUCH ULTRA TEST) test strip Use to check CBGs qam fasting, 2 hrs postprandial, & if having hyper or hypoglycemic sxs. Dx E11.65, Z79.4 08/25/19  Yes Rutherford Guys, MD  ibandronate (BONIVA) 150 MG tablet Take 1 tablet (150 mg total) by mouth every 30 (thirty) days. Take in the morning with a full glass of water, on an empty stomach, and do not take anything else by mouth or lie down for the next 30 min. 07/02/19  Yes Renato Shin, MD  ibuprofen (ADVIL,MOTRIN) 200 MG tablet Take 200 mg by mouth every 6 (six) hours as needed.   Yes [provider]  insulin glargine (LANTUS SOLOSTAR)  100 UNIT/ML Solostar Pen Inject 20 Units into the skin every morning. 10/09/19  Yes Rutherford Guys, MD  latanoprost (XALATAN) 0.005 % ophthalmic solution INSTILL 1 DROP INTO EACH EYE ONCE DAILY IN THE EVENING 04/30/19  Yes [provider]  metFORMIN (GLUCOPHAGE) 1000 MG tablet TAKE 1 TABLET BY MOUTH TWICE DAILY WITH A MEAL 11/22/19  Yes Rutherford Guys, MD  methimazole (TAPAZOLE) 5 MG tablet Take 0.5 tablets (2.5 mg total) by mouth daily. 06/02/19  Yes Renato Shin, MD  mometasone (NASONEX) 50 MCG/ACT nasal spray 2 sprays each nostril daily 10/09/19  Yes Rutherford Guys, MD  pantoprazole (PROTONIX) 20 MG tablet Take 1 tablet (20 mg total) by mouth  daily as needed for heartburn. 07/17/19  Yes Rutherford Guys, MD  TRULICITY 6.96 VE/9.3YB SOPN INJECT 0.75MG INTO THE SKIN ONCE A WEEK 01/02/20  Yes Rutherford Guys, MD    Past Medical History:  Diagnosis Date  . Allergy   . Diabetes mellitus without complication ([place])   . GERD (gastroesophageal reflux disease)   . Hyperlipidemia   . Osteoporosis     Past Surgical History:  Procedure Laterality Date  . COLONOSCOPY     in [place]  . TUBAL LIGATION  1985    Social History   Tobacco Use  . Smoking status: Current Every Day Smoker    Packs/day: 0.25    Years: 43.00    Pack years: 10.75    Types: Cigarettes  . Smokeless tobacco: Never Used  . Tobacco comment: Husband smokes 1 ppd.  He might be willing to quit at same time.  Substance Use Topics  . Alcohol use: No    Family History  Problem Relation Age of Onset  . Asthma Brother   . Thyroid disease Neg Hx   . Osteoporosis Neg Hx   . Colon cancer Neg Hx   . Colon polyps Neg Hx   . Stomach cancer Neg Hx   . Esophageal cancer Neg Hx     Review of Systems  Constitutional: Negative for chills and fever.  Respiratory: Negative for cough and shortness of breath.   Cardiovascular: Negative for chest pain, palpitations and leg swelling.  Gastrointestinal: Negative for abdominal pain, nausea and vomiting.   Per hpi  OBJECTIVE:  Today's Vitals   01/26/20 1423  BP: 106/64  Pulse: 75  Resp: 15  Temp: 97.6 F (36.4 C)  TempSrc: Temporal  SpO2: 95%  Weight: 137 lb 12.8 oz (62.5 kg)  Height: 5' 3" (1.6 m)   Body mass index is 24.41 kg/m.   Physical Exam Vitals and nursing note reviewed.  Constitutional:      Appearance: She is well-developed.  HENT:     Head: Normocephalic and atraumatic.     Mouth/Throat:     Pharynx: No oropharyngeal exudate.  Eyes:     General: No scleral icterus.    Conjunctiva/sclera: Conjunctivae normal.     Pupils: Pupils are equal, round, and reactive to light.  Cardiovascular:      Rate and Rhythm: Normal rate and regular rhythm.     Heart sounds: Normal heart sounds. No murmur heard.  No friction rub. No gallop.   Pulmonary:     Effort: Pulmonary effort is normal.     Breath sounds: Normal breath sounds. No wheezing or rales.  Abdominal:     General: Bowel sounds are normal. There is no distension.     Palpations: Abdomen is soft. There is no hepatomegaly or splenomegaly.  Tenderness: There is no abdominal tenderness. There is no right CVA tenderness or left CVA tenderness.  Musculoskeletal:     Cervical back: Neck supple.     Thoracic back: Spasms (right paraspinal) present. No bony tenderness. Normal range of motion.  Skin:    General: Skin is warm and dry.  Neurological:     Mental Status: She is alert and oriented to person, place, and time.     Results for orders placed or performed in visit on 01/26/20 (from the past 24 hour(s))  POCT A1C     Status: Abnormal   Collection Time: 01/26/20  2:58 PM  Result Value Ref Range   Hemoglobin A1C 8.5 (A) 4.0 - 5.6 %   HbA1c POC (<> result, manual entry)     HbA1c, POC (prediabetic range)     HbA1c, POC (controlled diabetic range)      No results found.   ASSESSMENT and PLAN  1. Type 2 diabetes mellitus with hypoglycemia without coma, with long-term current use of insulin (HCC) a1c not at goal however past week cbgs with very tight control and recurring hypoglycemia, decrease lantus to 15units daily. Discussed ssx/mgt of hypoglycemia and ER precautions - Lipid panel - CMP14+EGFR - POCT A1C  2. Mixed hyperlipidemia Checking labs today, medications will be adjusted as needed.  - atorvastatin (LIPITOR) 80 MG tablet; Take 1 tablet (80 mg total) by mouth daily.  3. Acute right-sided thoracic back pain Discussed supportive measures. rx for flexeril given, reveiwed r/se/b  Other orders - insulin glargine (LANTUS SOLOSTAR) 100 UNIT/ML Solostar Pen; Inject 15 Units into the skin every morning. -  cyclobenzaprine (FLEXERIL) 5 MG tablet; Take 1 tablet (5 mg total) by mouth 3 (three) times daily as needed for muscle spasms.  Return in about 2 weeks (around 02/09/2020) for hypoglycemia, ok to overbook.    Rutherford Guys, MD Primary Care at [facility] [place], [place] [address] Ph.  [phone] Fax [phone]

## 2020-01-27 LAB — CMP14+EGFR
ALT: 23 IU/L (ref 0–32)
AST: 25 IU/L (ref 0–40)
Albumin/Globulin Ratio: 1.6 (ref 1.2–2.2)
Albumin: 4.2 g/dL (ref 3.8–4.8)
Alkaline Phosphatase: 79 IU/L (ref 48–121)
BUN/Creatinine Ratio: 15 (ref 12–28)
BUN: 12 mg/dL (ref 8–27)
Bilirubin Total: <0.2 mg/dL (ref 0.0–1.2)
CO2: 25 mmol/L (ref 20–29)
Calcium: 9.8 mg/dL (ref 8.7–10.3)
Chloride: 104 mmol/L (ref 96–106)
Creatinine, Ser: 0.82 mg/dL (ref 0.57–1.00)
GFR calc Af Amer: 86 mL/min/{1.73_m2} (ref >59)
GFR calc non Af Amer: 74 mL/min/{1.73_m2} (ref >59)
Globulin, Total: 2.7 g/dL (ref 1.5–4.5)
Glucose: 230 mg/dL — ABNORMAL HIGH (ref 65–99)
Potassium: 4 mmol/L (ref 3.5–5.2)
Sodium: 143 mmol/L (ref 134–144)
Total Protein: 6.9 g/dL (ref 6.0–8.5)

## 2020-01-27 LAB — LIPID PANEL
Chol/HDL Ratio: 3.4 ratio (ref 0.0–4.4)
Cholesterol, Total: 112 mg/dL (ref 100–199)
HDL: 33 mg/dL — ABNORMAL LOW (ref >39)
LDL Chol Calc (NIH): 50 mg/dL (ref 0–99)
Triglycerides: 170 mg/dL — ABNORMAL HIGH (ref 0–149)
VLDL Cholesterol Cal: 29 mg/dL (ref 5–40)

## 2020-02-09 ENCOUNTER — Encounter: Payer: Self-pay | Admitting: Family Medicine

## 2020-02-09 ENCOUNTER — Ambulatory Visit (INDEPENDENT_AMBULATORY_CARE_PROVIDER_SITE_OTHER): Payer: Medicare Other | Admitting: Family Medicine

## 2020-02-09 ENCOUNTER — Other Ambulatory Visit: Payer: Self-pay

## 2020-02-09 VITALS — BP 103/66 | HR 91 | Temp 97.8°F | Wt 136.0 lb

## 2020-02-09 DIAGNOSIS — Z794 Long term (current) use of insulin: Secondary | ICD-10-CM

## 2020-02-09 DIAGNOSIS — E1165 Type 2 diabetes mellitus with hyperglycemia: Secondary | ICD-10-CM | POA: Diagnosis not present

## 2020-02-09 NOTE — Progress Notes (Signed)
7/12/20218:47 AM  Alyssa Weaver 08-Nov-1950, 69 y.o., female [id number]  Chief Complaint  Patient presents with  . Diabetes    follow up / FBS 98 this am at home, reports no concerns    HPI:   Patient is a 69 y.o. female with past medical history significant for DM2 insulin dependent, HLP, subclinical hyperthyroidism/multinodular thyroid, seasonal allergieswho presents today for routine followup  Last OV 2 weeks ago, recurring fasting hypoglycemia, decreased lantus to 15 units  She is overall doing well She has not had anymore low cbgs She checks her cbgs TID AC Last 2 weeks avg Fasting: 125 AC lunch: 127 AC dinner: 169  Her husband serves as interpreter  Lab Results  Component Value Date   HGBA1C 8.5 (A) 01/26/2020    Depression screen [facility] 2/9 02/09/2020 01/26/2020 11/10/2019  Decreased Interest 0 0 0  Down, Depressed, Hopeless 0 0 0  PHQ - 2 Score 0 0 0  Altered sleeping - - -  Tired, decreased energy - - -  Change in appetite - - -  Feeling bad or failure about yourself  - - -  Trouble concentrating - - -  Moving slowly or fidgety/restless - - -  Suicidal thoughts - - -  PHQ-9 Score - - -    Fall Risk  02/09/2020 01/26/2020 11/10/2019 09/25/2019 08/18/2019  Falls in the past year? 0 0 0 0 0  Number falls in past yr: 0 - 0 0 0  Injury with Fall? 0 - 0 0 0  Follow up - Falls evaluation completed Falls evaluation completed - -     No Known Allergies  Prior to Admission medications   Medication Sig Start Date End Date Taking? Authorizing Provider  atorvastatin (LIPITOR) 80 MG tablet Take 1 tablet (80 mg total) by mouth daily. 01/26/20  Yes Rutherford Guys, MD  Blood Glucose Monitoring Suppl [facility] PRESTO) w/Device KIT Twice daily sugar checks before meals 04/18/17  Yes Mack Hook, MD  Calcium Citrate-Vitamin D (CITRACAL/VITAMIN D) 250-200 MG-UNIT TABS 2 tabs by mouth twice daily 08/24/17  Yes Mack Hook, MD  cetirizine (ZYRTEC) 10 MG  tablet Take 1 tablet (10 mg total) by mouth daily. 11/10/19  Yes Maximiano Coss, NP  cyclobenzaprine (FLEXERIL) 5 MG tablet Take 1 tablet (5 mg total) by mouth 3 (three) times daily as needed for muscle spasms. 01/26/20  Yes Rutherford Guys, MD  glucose blood (ONE TOUCH ULTRA TEST) test strip Use to check CBGs qam fasting, 2 hrs postprandial, & if having hyper or hypoglycemic sxs. Dx E11.65, Z79.4 08/25/19  Yes Rutherford Guys, MD  ibandronate (BONIVA) 150 MG tablet Take 1 tablet (150 mg total) by mouth every 30 (thirty) days. Take in the morning with a full glass of water, on an empty stomach, and do not take anything else by mouth or lie down for the next 30 min. 07/02/19  Yes Renato Shin, MD  ibuprofen (ADVIL,MOTRIN) 200 MG tablet Take 200 mg by mouth every 6 (six) hours as needed.   Yes [provider]  insulin glargine (LANTUS SOLOSTAR) 100 UNIT/ML Solostar Pen Inject 15 Units into the skin every morning. 01/26/20  Yes Rutherford Guys, MD  latanoprost (XALATAN) 0.005 % ophthalmic solution INSTILL 1 DROP INTO EACH EYE ONCE DAILY IN THE EVENING 04/30/19  Yes [provider]  metFORMIN (GLUCOPHAGE) 1000 MG tablet TAKE 1 TABLET BY MOUTH TWICE DAILY WITH A MEAL 11/22/19  Yes Rutherford Guys, MD  methimazole (TAPAZOLE) 5  MG tablet Take 0.5 tablets (2.5 mg total) by mouth daily. 06/02/19  Yes Renato Shin, MD  mometasone (NASONEX) 50 MCG/ACT nasal spray 2 sprays each nostril daily 10/09/19  Yes Rutherford Guys, MD  pantoprazole (PROTONIX) 20 MG tablet Take 1 tablet (20 mg total) by mouth daily as needed for heartburn. 07/17/19  Yes Rutherford Guys, MD  TRULICITY 3.90 ZE/0.9QZ SOPN INJECT 0.75MG INTO THE SKIN ONCE A WEEK 01/02/20  Yes Rutherford Guys, MD    Past Medical History:  Diagnosis Date  . Allergy   . Diabetes mellitus without complication ([place])   . GERD (gastroesophageal reflux disease)   . Hyperlipidemia   . Osteoporosis     Past Surgical History:  Procedure  Laterality Date  . COLONOSCOPY     in [place]  . TUBAL LIGATION  1985    Social History   Tobacco Use  . Smoking status: Current Every Day Smoker    Packs/day: 0.25    Years: 43.00    Pack years: 10.75    Types: Cigarettes  . Smokeless tobacco: Never Used  . Tobacco comment: Husband smokes 1 ppd.  He might be willing to quit at same time.  Substance Use Topics  . Alcohol use: No    Family History  Problem Relation Age of Onset  . Asthma Brother   . Thyroid disease Neg Hx   . Osteoporosis Neg Hx   . Colon cancer Neg Hx   . Colon polyps Neg Hx   . Stomach cancer Neg Hx   . Esophageal cancer Neg Hx     ROS Per hpi  OBJECTIVE:  Today's Vitals   02/09/20 0826  BP: 103/66  Pulse: 91  Temp: 97.8 F (36.6 C)  SpO2: 98%  Weight: 136 lb (61.7 kg)   Body mass index is 24.09 kg/m.   Physical Exam Vitals and nursing note reviewed.  Constitutional:      Appearance: She is well-developed.  HENT:     Head: Normocephalic and atraumatic.  Eyes:     General: No scleral icterus.    Conjunctiva/sclera: Conjunctivae normal.     Pupils: Pupils are equal, round, and reactive to light.  Pulmonary:     Effort: Pulmonary effort is normal.  Musculoskeletal:     Cervical back: Neck supple.  Skin:    General: Skin is warm and dry.  Neurological:     Mental Status: She is alert and oriented to person, place, and time.     No results found for this or any previous visit (from the past 24 hour(s)).  No results found.   ASSESSMENT and PLAN  1. Type 2 diabetes mellitus with hyperglycemia, with long-term current use of insulin (HCC) Hypoglycemia has resolved. Overall cbgs improved. Consider increasing trulicity at next OV. Discussed LFM.  Return in about 3 months (around 05/11/2020).    Rutherford Guys, MD Primary Care at [place],  [address] Ph.  [phone] Fax [phone]

## 2020-02-09 NOTE — Patient Instructions (Signed)
° ° ° °  If you have lab work done today you will be contacted with your lab results within the next 2 weeks.  If you have not heard from us then please contact us. The fastest way to get your results is to register for My Chart. ° ° °IF you received an x-ray today, you will receive an invoice from [facility]. Please contact [facility] at [phone] with questions or concerns regarding your invoice.  ° °IF you received labwork today, you will receive an invoice from [facility]. Please contact [facility] at [phone] with questions or concerns regarding your invoice.  ° °Our billing staff will not be able to assist you with questions regarding bills from these companies. ° °You will be contacted with the lab results as soon as they are available. The fastest way to get your results is to activate your My Chart account. Instructions are located on the last page of this paperwork. If you have not heard from us regarding the results in 2 weeks, please contact this office. °  ° ° ° °

## 2020-02-23 ENCOUNTER — Other Ambulatory Visit: Payer: Self-pay | Admitting: Family Medicine

## 2020-02-23 DIAGNOSIS — E1165 Type 2 diabetes mellitus with hyperglycemia: Secondary | ICD-10-CM

## 2020-03-24 ENCOUNTER — Other Ambulatory Visit: Payer: Self-pay | Admitting: Family Medicine

## 2020-05-03 ENCOUNTER — Ambulatory Visit (INDEPENDENT_AMBULATORY_CARE_PROVIDER_SITE_OTHER): Payer: Medicare Other | Admitting: Family Medicine

## 2020-05-03 ENCOUNTER — Other Ambulatory Visit: Payer: Self-pay

## 2020-05-03 ENCOUNTER — Encounter: Payer: Self-pay | Admitting: Family Medicine

## 2020-05-03 VITALS — BP 113/71 | HR 87 | Temp 97.6°F | Ht 63.0 in | Wt 137.9 lb

## 2020-05-03 DIAGNOSIS — L659 Nonscarring hair loss, unspecified: Secondary | ICD-10-CM

## 2020-05-03 DIAGNOSIS — E1165 Type 2 diabetes mellitus with hyperglycemia: Secondary | ICD-10-CM

## 2020-05-03 DIAGNOSIS — Z794 Long term (current) use of insulin: Secondary | ICD-10-CM | POA: Diagnosis not present

## 2020-05-03 DIAGNOSIS — E0590 Thyrotoxicosis, unspecified without thyrotoxic crisis or storm: Secondary | ICD-10-CM

## 2020-05-03 DIAGNOSIS — E782 Mixed hyperlipidemia: Secondary | ICD-10-CM

## 2020-05-03 DIAGNOSIS — Z23 Encounter for immunization: Secondary | ICD-10-CM | POA: Diagnosis not present

## 2020-05-03 LAB — POCT GLYCOSYLATED HEMOGLOBIN (HGB A1C): Hemoglobin A1C: 8.5 % — AB (ref 4.0–5.6)

## 2020-05-03 MED ORDER — TRULICITY 1.5 MG/0.5ML ~~LOC~~ SOAJ
1.5000 mg | SUBCUTANEOUS | 5 refills | Status: DC
Start: 2020-05-03 — End: 2020-09-03

## 2020-05-03 MED ORDER — LANTUS SOLOSTAR 100 UNIT/ML ~~LOC~~ SOPN
18.0000 [IU] | PEN_INJECTOR | SUBCUTANEOUS | Status: DC
Start: 2020-05-03 — End: 2021-03-14

## 2020-05-03 MED ORDER — IBANDRONATE SODIUM 150 MG PO TABS
150.0000 mg | ORAL_TABLET | ORAL | 3 refills | Status: DC
Start: 2020-05-03 — End: 2020-09-03

## 2020-05-03 NOTE — Patient Instructions (Addendum)
  Use rogaine for next 2-3 months   If you have lab work done today you will be contacted with your lab results within the next 2 weeks.  If you have not heard from [place] then please contact us. The fastest way to get your results is to register for My Chart.   IF you received an x-ray today, you will receive an invoice from [facility]. Please contact [facility] at [phone] with questions or concerns regarding your invoice.   IF you received labwork today, you will receive an invoice from [facility]. Please contact [facility] at [phone] with questions or concerns regarding your invoice.   Our billing staff will not be able to assist you with questions regarding bills from these companies.  You will be contacted with the lab results as soon as they are available. The fastest way to get your results is to activate your My Chart account. Instructions are located on the last page of this paperwork. If you have not heard from [place] regarding the results in 2 weeks, please contact this office.

## 2020-05-03 NOTE — Progress Notes (Signed)
10/4/20213:04 PM  Alyssa Weaver [birth year], 69 y.o., female [id number]  Chief Complaint  Patient presents with   Diabetes    follow up   Medication Problem    no longer taking vit c making hair come out.     HPI:   Patient is a 69 y.o. female with past medical history significant for  DM2 insulin dependent, HLP, subclinical hyperthyroidism/multinodular thyroid, seasonal allergieswho presents today for routine followup  Last OV July 2021 - no changes  loaotian interpreter present Patient increased her lantus to 18 units as she was having higher fasting She denies any low cbgs She checks her cbgs TID (fasting, afternoon and bedtime) Fasting this morning 133 She walks 2-3 x week Having hair loss for about 2 months ago Denies any fhx female hair loss She last took methimazole a month ago She is not seeing Dr Loanne Drilling, endo anymore  Wt Readings from Last 3 Encounters:  05/03/20 137 lb 14.4 oz (62.6 kg)  02/09/20 136 lb (61.7 kg)  01/26/20 137 lb 12.8 oz (62.5 kg)   BP Readings from Last 3 Encounters:  05/03/20 113/71  02/09/20 103/66  01/26/20 106/64    Lab Results  Component Value Date   HGBA1C 8.5 (A) 01/26/2020   HGBA1C 8.0 (A) 11/10/2019   HGBA1C 7.6 (A) 08/18/2019   Lab Results  Component Value Date   MICROALBUR neg 11/19/2019   LDLCALC 50 01/26/2020   CREATININE 0.82 01/26/2020    Depression screen PHQ 2/9 02/09/2020 01/26/2020 11/10/2019  Decreased Interest 0 0 0  Down, Depressed, Hopeless 0 0 0  PHQ - 2 Score 0 0 0  Altered sleeping - - -  Tired, decreased energy - - -  Change in appetite - - -  Feeling bad or failure about yourself  - - -  Trouble concentrating - - -  Moving slowly or fidgety/restless - - -  Suicidal thoughts - - -  PHQ-9 Score - - -    Fall Risk  02/09/2020 01/26/2020 11/10/2019 09/25/2019 08/18/2019  Falls in the past year? 0 0 0 0 0  Number falls in past yr: 0 - 0 0 0  Injury with Fall? 0 - 0 0 0  Follow up - Falls  evaluation completed Falls evaluation completed - -     No Known Allergies  Prior to Admission medications   Medication Sig Start Date End Date Taking? Authorizing Provider  atorvastatin (LIPITOR) 80 MG tablet Take 1 tablet (80 mg total) by mouth daily. 01/26/20  Yes Rutherford Guys, MD  Blood Glucose Monitoring Suppl [facility] PRESTO) w/Device KIT Twice daily sugar checks before meals 04/18/17  Yes Mack Hook, MD  Calcium Citrate-Vitamin D (CITRACAL/VITAMIN D) 250-200 MG-UNIT TABS 2 tabs by mouth twice daily 08/24/17  Yes Mack Hook, MD  cetirizine (ZYRTEC) 10 MG tablet Take 1 tablet (10 mg total) by mouth daily. 11/10/19  Yes Maximiano Coss, NP  cyclobenzaprine (FLEXERIL) 5 MG tablet Take 1 tablet (5 mg total) by mouth 3 (three) times daily as needed for muscle spasms. 01/26/20  Yes Rutherford Guys, MD  glucose blood (ONE TOUCH ULTRA TEST) test strip Use to check CBGs qam fasting, 2 hrs postprandial, & if having hyper or hypoglycemic sxs. Dx E11.65, Z79.4 08/25/19  Yes Rutherford Guys, MD  ibandronate (BONIVA) 150 MG tablet Take 1 tablet (150 mg total) by mouth every 30 (thirty) days. Take in the morning with a full glass of water, on an empty stomach, and do not  take anything else by mouth or lie down for the next 30 min. 07/02/19  Yes Renato Shin, MD  ibuprofen (ADVIL,MOTRIN) 200 MG tablet Take 200 mg by mouth every 6 (six) hours as needed.   Yes [provider]  insulin glargine (LANTUS SOLOSTAR) 100 UNIT/ML Solostar Pen Inject 15 Units into the skin every morning. 01/26/20  Yes Rutherford Guys, MD  latanoprost (XALATAN) 0.005 % ophthalmic solution INSTILL 1 DROP INTO EACH EYE ONCE DAILY IN THE EVENING 04/30/19  Yes [provider]  metFORMIN (GLUCOPHAGE) 1000 MG tablet TAKE 1 TABLET BY MOUTH TWICE DAILY WITH A MEAL 02/23/20  Yes Rutherford Guys, MD  methimazole (TAPAZOLE) 5 MG tablet Take 0.5 tablets (2.5 mg total) by mouth daily. 06/02/19  Yes Renato Shin,  MD  mometasone (NASONEX) 50 MCG/ACT nasal spray 2 sprays each nostril daily 10/09/19  Yes Rutherford Guys, MD  pantoprazole (PROTONIX) 20 MG tablet Take 1 tablet (20 mg total) by mouth daily as needed for heartburn. 07/17/19  Yes Rutherford Guys, MD  TRULICITY 5.00 XF/8.1WE SOPN INJECT 0.75 MG INTO THE SKIN ONCE A WEEK 03/24/20  Yes Rutherford Guys, MD    Past Medical History:  Diagnosis Date   Allergy    Diabetes mellitus without complication ([place])    GERD (gastroesophageal reflux disease)    Hyperlipidemia    Osteoporosis     Past Surgical History:  Procedure Laterality Date   COLONOSCOPY     in [place]    Social History   Tobacco Use   Smoking status: Current Every Day Smoker    Packs/day: 0.25    Years: 43.00    Pack years: 10.75    Types: Cigarettes   Smokeless tobacco: Never Used   Tobacco comment: Husband smokes 1 ppd.  He might be willing to quit at same time.  Substance Use Topics   Alcohol use: No    Family History  Problem Relation Age of Onset   Asthma Brother    Thyroid disease Neg Hx    Osteoporosis Neg Hx    Colon cancer Neg Hx    Colon polyps Neg Hx    Stomach cancer Neg Hx    Esophageal cancer Neg Hx     Review of Systems  Constitutional: Negative for chills and fever.  Respiratory: Negative for cough and shortness of breath.   Cardiovascular: Negative for chest pain, palpitations and leg swelling.  Gastrointestinal: Negative for abdominal pain, nausea and vomiting.     OBJECTIVE:  Today's Vitals   05/03/20 1458  BP: 113/71  Pulse: 87  Temp: 97.6 F (36.4 C)  SpO2: 98%  Weight: 137 lb 14.4 oz (62.6 kg)  Height: '5\' 3"'  (1.6 m)   Body mass index is 24.43 kg/m.   Physical Exam Vitals and nursing note reviewed.  Constitutional:      Appearance: She is well-developed.  HENT:     Head: Normocephalic and atraumatic.     Mouth/Throat:     Pharynx: No oropharyngeal exudate.  Eyes:     General:  No scleral icterus.    Extraocular Movements: Extraocular movements intact.     Conjunctiva/sclera: Conjunctivae normal.     Pupils: Pupils are equal, round, and reactive to light.  Cardiovascular:     Rate and Rhythm: Normal rate and regular rhythm.     Heart sounds: Normal heart sounds. No murmur heard.  No friction rub. No gallop.   Pulmonary:  Effort: Pulmonary effort is normal.     Breath sounds: Normal breath sounds. No wheezing, rhonchi or rales.  Musculoskeletal:     Cervical back: Neck supple.     Right lower leg: No edema.     Left lower leg: No edema.  Skin:    General: Skin is warm and dry.  Neurological:     Mental Status: She is alert and oriented to person, place, and time.     Results for orders placed or performed in visit on 05/03/20 (from the past 24 hour(s))  POCT glycosylated hemoglobin (Hb A1C)     Status: Abnormal   Collection Time: 05/03/20  3:21 PM  Result Value Ref Range   Hemoglobin A1C 8.5 (A) 4.0 - 5.6 %   HbA1c POC (<> result, manual entry)     HbA1c, POC (prediabetic range)     HbA1c, POC (controlled diabetic range)      No results found.   ASSESSMENT and PLAN  1. Type 2 diabetes mellitus with hyperglycemia, with long-term current use of insulin (HCC) Not controlled. Increase trulicity to 5.7VJ weekly.  - CMP14+EGFR - Lipid panel - POCT glycosylated hemoglobin (Hb A1C) - Ambulatory referral to Ophthalmology - TSH  2. Mixed hyperlipidemia Checking labs today, medications will be adjusted as needed.  - CMP14+EGFR - Lipid panel   3. Need for prophylactic vaccination and inoculation against influenza - Flu Vaccine QUAD High Dose(Fluad)  4. Hair loss Discussed rogaine. Thyroid labs pending  5. Hyperthyroidism Patient off meds, labs pending, consider referring back to endo if abnormal - TSH - T4, Free  Other orders - Dulaglutide (TRULICITY) 1.5 KQ/2.0UO SOPN; Inject 1.5 mg into the skin once a week. - insulin glargine (LANTUS  SOLOSTAR) 100 UNIT/ML Solostar Pen; Inject 18 Units into the skin every morning. - ibandronate (BONIVA) 150 MG tablet; Take 1 tablet (150 mg total) by mouth every 30 (thirty) days. Take in the morning with a full glass of water, on an empty stomach, and do not take anything else by mouth or lie down for the next 30 min.  Return in about 3 months (around 08/03/2020) for please bring in your medicationsu77.    Rutherford Guys, MD Primary Care at [place], [place] [address] Ph.  [phone] Fax [phone]

## 2020-05-04 LAB — CMP14+EGFR
ALT: 24 IU/L (ref 0–32)
AST: 24 IU/L (ref 0–40)
Albumin/Globulin Ratio: 2.1 (ref 1.2–2.2)
Albumin: 4.6 g/dL (ref 3.8–4.8)
Alkaline Phosphatase: 54 IU/L (ref 44–121)
BUN/Creatinine Ratio: 19 (ref 12–28)
BUN: 15 mg/dL (ref 8–27)
Bilirubin Total: <0.2 mg/dL (ref 0.0–1.2)
CO2: 25 mmol/L (ref 20–29)
Calcium: 9.3 mg/dL (ref 8.7–10.3)
Chloride: 101 mmol/L (ref 96–106)
Creatinine, Ser: 0.8 mg/dL (ref 0.57–1.00)
GFR calc Af Amer: 88 mL/min/{1.73_m2} (ref >59)
GFR calc non Af Amer: 77 mL/min/{1.73_m2} (ref >59)
Globulin, Total: 2.2 g/dL (ref 1.5–4.5)
Glucose: 206 mg/dL — ABNORMAL HIGH (ref 65–99)
Potassium: 4.3 mmol/L (ref 3.5–5.2)
Sodium: 139 mmol/L (ref 134–144)
Total Protein: 6.8 g/dL (ref 6.0–8.5)

## 2020-05-04 LAB — LIPID PANEL
Chol/HDL Ratio: 2.9 ratio (ref 0.0–4.4)
Cholesterol, Total: 106 mg/dL (ref 100–199)
HDL: 36 mg/dL — ABNORMAL LOW (ref >39)
LDL Chol Calc (NIH): 43 mg/dL (ref 0–99)
Triglycerides: 160 mg/dL — ABNORMAL HIGH (ref 0–149)
VLDL Cholesterol Cal: 27 mg/dL (ref 5–40)

## 2020-05-04 LAB — TSH: TSH: 0.245 u[IU]/mL — ABNORMAL LOW (ref 0.450–4.500)

## 2020-05-04 LAB — T4, FREE: Free T4: 1.3 ng/dL (ref 0.82–1.77)

## 2020-05-17 ENCOUNTER — Encounter: Payer: Self-pay | Admitting: Family Medicine

## 2020-06-23 ENCOUNTER — Encounter (HOSPITAL_COMMUNITY): Payer: Self-pay | Admitting: Pediatrics

## 2020-06-23 ENCOUNTER — Other Ambulatory Visit: Payer: Self-pay

## 2020-06-23 ENCOUNTER — Emergency Department (HOSPITAL_COMMUNITY)
Admission: EM | Admit: 2020-06-23 | Discharge: 2020-06-23 | Disposition: A | Discharge status: Home / Self Care | Payer: Medicare Other | Attending: Emergency Medicine | Admitting: Emergency Medicine

## 2020-06-23 DIAGNOSIS — Z794 Long term (current) use of insulin: Secondary | ICD-10-CM | POA: Insufficient documentation

## 2020-06-23 DIAGNOSIS — E1165 Type 2 diabetes mellitus with hyperglycemia: Secondary | ICD-10-CM | POA: Diagnosis not present

## 2020-06-23 DIAGNOSIS — Z7984 Long term (current) use of oral hypoglycemic drugs: Secondary | ICD-10-CM | POA: Diagnosis not present

## 2020-06-23 DIAGNOSIS — F17210 Nicotine dependence, cigarettes, uncomplicated: Secondary | ICD-10-CM | POA: Insufficient documentation

## 2020-06-23 DIAGNOSIS — N819 Female genital prolapse, unspecified: Secondary | ICD-10-CM

## 2020-06-23 DIAGNOSIS — R739 Hyperglycemia, unspecified: Secondary | ICD-10-CM

## 2020-06-23 DIAGNOSIS — R102 Pelvic and perineal pain: Secondary | ICD-10-CM | POA: Diagnosis present

## 2020-06-23 LAB — COMPREHENSIVE METABOLIC PANEL
ALT: 24 U/L (ref 0–44)
AST: 24 U/L (ref 15–41)
Albumin: 4 g/dL (ref 3.5–5.0)
Alkaline Phosphatase: 41 U/L (ref 38–126)
Anion gap: 8 (ref 5–15)
BUN: 9 mg/dL (ref 8–23)
CO2: 25 mmol/L (ref 22–32)
Calcium: 9.2 mg/dL (ref 8.9–10.3)
Chloride: 106 mmol/L (ref 98–111)
Creatinine, Ser: 0.66 mg/dL (ref 0.44–1.00)
GFR, Estimated: >60 mL/min (ref >60)
Glucose, Bld: 201 mg/dL — ABNORMAL HIGH (ref 70–99)
Potassium: 4 mmol/L (ref 3.5–5.1)
Sodium: 139 mmol/L (ref 135–145)
Total Bilirubin: 0.6 mg/dL (ref 0.3–1.2)
Total Protein: 6.8 g/dL (ref 6.5–8.1)

## 2020-06-23 LAB — CBC WITH DIFFERENTIAL/PLATELET
Abs Immature Granulocytes: 0.01 10*3/uL (ref 0.00–0.07)
Basophils Absolute: 0 10*3/uL (ref 0.0–0.1)
Basophils Relative: 0 %
Eosinophils Absolute: 0.2 10*3/uL (ref 0.0–0.5)
Eosinophils Relative: 5 %
HCT: 38.5 % (ref 36.0–46.0)
Hemoglobin: 11.7 g/dL — ABNORMAL LOW (ref 12.0–15.0)
Immature Granulocytes: 0 %
Lymphocytes Relative: 26 %
Lymphs Abs: 1.2 10*3/uL (ref 0.7–4.0)
MCH: 26.1 pg (ref 26.0–34.0)
MCHC: 30.4 g/dL (ref 30.0–36.0)
MCV: 85.7 fL (ref 80.0–100.0)
Monocytes Absolute: 0.4 10*3/uL (ref 0.1–1.0)
Monocytes Relative: 8 %
Neutro Abs: 2.9 10*3/uL (ref 1.7–7.7)
Neutrophils Relative %: 61 %
Platelets: 211 10*3/uL (ref 150–400)
RBC: 4.49 MIL/uL (ref 3.87–5.11)
RDW: 14.2 % (ref 11.5–15.5)
WBC: 4.8 10*3/uL (ref 4.0–10.5)
nRBC: 0 % (ref 0.0–0.2)

## 2020-06-23 LAB — URINALYSIS, ROUTINE W REFLEX MICROSCOPIC
Bacteria, UA: NONE SEEN
Bilirubin Urine: NEGATIVE
Glucose, UA: 50 mg/dL — AB
Hgb urine dipstick: NEGATIVE
Ketones, ur: NEGATIVE mg/dL
Nitrite: NEGATIVE
Protein, ur: NEGATIVE mg/dL
Specific Gravity, Urine: 1.013 (ref 1.005–1.030)
pH: 7 (ref 5.0–8.0)

## 2020-06-23 MED ORDER — ACETAMINOPHEN ER 650 MG PO TBCR: 650.0000 mg | EXTENDED_RELEASE_TABLET | Freq: Three times a day (TID) | ORAL | 0 refills | Status: DC | PRN

## 2020-06-23 NOTE — Discharge Instructions (Signed)
As discussed, your labs were all reassuring today. Your glucose was elevated at 200. Please follow-up with your PCP within the next week to check your glucose. Continue taking your medications as prescribed. Your pelvic exam showed you had a mild prolapse. I have included the number for the OBGYN. Call today to schedule an appointment for further evaluation. You may take Tylenol as needed for discomfort. Return to the ER for new or worsening symptoms.

## 2020-06-23 NOTE — ED Provider Notes (Signed)
[facility] Provider Note   CSN: [id number] Arrival date & time: 06/23/20  0488     History Chief Complaint  Patient presents with  . Pelvic Pain    Alyssa [facility] is a 69 y.o. female with a past medical history significant for allergies, insulin-dependent diabetes, GERD, hyperlipidemia, and osteoporosis who presents to the ED due to intermittent vaginal pressure x1 week.  Patient admits to vaginal pressure associated with "discomfort" in the suprapubic region.  She admits to a sensation of feeling like something is coming out of her vagina for the past week which requires her to "push it back up". Pelvic pressure worse after walking and any physical activity. Denies associated dysuria, vaginal discharge, back pain, flank pain, fever, chills. No treatment prior to arrival. No current OBGYN. Patient has not been sexually active for over 4 months and has no concerned for STDs at this time.  History obtained from patient and past medical records. [employer] officer, political party. Daughter acted as Optometrist at bedside.      Past Medical History:  Diagnosis Date  . Allergy   . Diabetes mellitus without complication ([place])   . GERD (gastroesophageal reflux disease)   . Hyperlipidemia   . Osteoporosis     Patient Active Problem List   Diagnosis Date Noted  . Gastroesophageal reflux disease without esophagitis 07/17/2019  . Age-related osteoporosis without current pathological fracture 05/20/2019  . Hyperthyroidism 05/20/2019  . Acute UTI 10/07/2018  . Vaginal discomfort 10/07/2018  . Tobacco use disorder 01/07/2018  . Abnormal TSH 01/07/2018  . Seasonal allergic rhinitis due to pollen 01/07/2018  . Gastritis without bleeding 06/12/2017  . Environmental allergies   . Type 2 diabetes mellitus with hyperglycemia, with long-term current use of insulin ([place]) 01/04/2016  . Hyperlipidemia 01/04/2016  . Left shoulder pain 01/04/2016    Past  Surgical History:  Procedure Laterality Date  . COLONOSCOPY     in [place]  . TUBAL LIGATION  1985     OB History    Gravida  5   Para      Term      Preterm      AB      Living  5     SAB      TAB      Ectopic      Multiple      Live Births              Family History  Problem Relation Age of Onset  . Asthma Brother   . Thyroid disease Neg Hx   . Osteoporosis Neg Hx   . Colon cancer Neg Hx   . Colon polyps Neg Hx   . Stomach cancer Neg Hx   . Esophageal cancer Neg Hx     Social History   Tobacco Use  . Smoking status: Current Every Day Smoker    Packs/day: 0.25    Years: 43.00    Pack years: 10.75    Types: Cigarettes  . Smokeless tobacco: Never Used  . Tobacco comment: Husband smokes 1 ppd.  He might be willing to quit at same time.  Vaping Use  . Vaping Use: Never used  Substance Use Topics  . Alcohol use: No  . Drug use: No    Home Medications Prior to Admission medications   Medication Sig Start Date End Date Taking? Authorizing Provider  atorvastatin (LIPITOR) 80 MG tablet Take 1 tablet (80 mg total) by mouth daily. 01/26/20  Yes Rutherford Guys, MD  Calcium Citrate-Vitamin D (CITRACAL/VITAMIN D) 250-200 MG-UNIT TABS 2 tabs by mouth twice daily 08/24/17  Yes Mack Hook, MD  cetirizine (ZYRTEC) 10 MG tablet Take 1 tablet (10 mg total) by mouth daily. 11/10/19  Yes Maximiano Coss, NP  Dulaglutide (TRULICITY) 1.5 UJ/8.1XB SOPN Inject 1.5 mg into the skin once a week. 05/03/20  Yes Rutherford Guys, MD  ibandronate (BONIVA) 150 MG tablet Take 1 tablet (150 mg total) by mouth every 30 (thirty) days. Take in the morning with a full glass of water, on an empty stomach, and do not take anything else by mouth or lie down for the next 30 min. 05/03/20  Yes Rutherford Guys, MD  insulin glargine (LANTUS SOLOSTAR) 100 UNIT/ML Solostar Pen Inject 18 Units into the skin every morning. 05/03/20  Yes Rutherford Guys, MD  latanoprost (XALATAN) 0.005 %  ophthalmic solution INSTILL 1 DROP INTO EACH EYE ONCE DAILY IN THE EVENING 04/30/19  Yes [provider]  metFORMIN (GLUCOPHAGE) 1000 MG tablet TAKE 1 TABLET BY MOUTH TWICE DAILY WITH A MEAL 02/23/20  Yes Rutherford Guys, MD  mometasone (NASONEX) 50 MCG/ACT nasal spray 2 sprays each nostril daily 10/09/19  Yes Rutherford Guys, MD  pantoprazole (PROTONIX) 20 MG tablet Take 1 tablet (20 mg total) by mouth daily as needed for heartburn. 07/17/19  Yes Rutherford Guys, MD  acetaminophen (TYLENOL 8 HOUR) 650 MG CR tablet Take 1 tablet (650 mg total) by mouth every 8 (eight) hours as needed for pain. 06/23/20   Suzy Bouchard, PA-C  Blood Glucose Monitoring Suppl (AGAMATRIX PRESTO) w/Device KIT Twice daily sugar checks before meals 04/18/17   Mack Hook, MD  cyclobenzaprine (FLEXERIL) 5 MG tablet Take 1 tablet (5 mg total) by mouth 3 (three) times daily as needed for muscle spasms. Patient not taking: Reported on 06/23/2020 01/26/20   Rutherford Guys, MD  glucose blood (ONE TOUCH ULTRA TEST) test strip Use to check CBGs qam fasting, 2 hrs postprandial, & if having hyper or hypoglycemic sxs. Dx E11.65, Z79.4 08/25/19   Rutherford Guys, MD  methimazole (TAPAZOLE) 5 MG tablet Take 0.5 tablets (2.5 mg total) by mouth daily. Patient not taking: Reported on 06/23/2020 06/02/19   Renato Shin, MD    Allergies    Patient has no known allergies.  Review of Systems   Review of Systems  Constitutional: Negative for chills and fever.  Respiratory: Negative for shortness of breath.   Cardiovascular: Negative for chest pain.  Gastrointestinal: Positive for abdominal pain (suprapubic discomfort). Negative for diarrhea, nausea and vomiting.  Genitourinary: Positive for pelvic pain (pelvic pressure). Negative for decreased urine volume, difficulty urinating, dysuria, flank pain, vaginal bleeding and vaginal discharge.  All other systems reviewed and are negative.   Physical Exam Updated  Vital Signs BP 138/62 (BP Location: Left Arm)   Pulse 83   Temp 97.9 F (36.6 C) (Oral)   Resp 18   Ht _0  (1.6 m)   Wt 63.5 kg   SpO2 100%   BMI 24.80 kg/m   Physical Exam Vitals and nursing note reviewed.  Constitutional:      General: She is not in acute distress. HENT:     Head: Normocephalic.  Eyes:     Pupils: Pupils are equal, round, and reactive to light.  Cardiovascular:     Rate and Rhythm: Normal rate and regular rhythm.     Pulses: Normal pulses.  Heart sounds: Normal heart sounds. No murmur heard.  No friction rub. No gallop.   Pulmonary:     Effort: Pulmonary effort is normal.     Breath sounds: Normal breath sounds.  Abdominal:     General: Abdomen is flat. Bowel sounds are normal. There is no distension.     Palpations: Abdomen is soft.     Tenderness: There is no abdominal tenderness. There is no right CVA tenderness, left CVA tenderness, guarding or rebound.     Comments: Abdomen soft, nondistended, nontender to palpation in all quadrants without guarding or peritoneal signs. No rebound.   Genitourinary:    Comments: Small prolapse when bearing down. No tenderness on pelvic exam. Normal appearing female genitalia. No discharge or lesions. Musculoskeletal:     Cervical back: Neck supple.     Comments: Able to move all 4 extremities without difficulty.  Skin:    General: Skin is warm and dry.  Neurological:     General: No focal deficit present.  Psychiatric:        Mood and Affect: Mood normal.        Behavior: Behavior normal.     ED Results / Procedures / Treatments   Labs (all labs ordered are listed, but only abnormal results are displayed) Labs Reviewed  URINALYSIS, ROUTINE W REFLEX MICROSCOPIC - Abnormal; Notable for the following components:      Result Value   Glucose, UA 50 (*)    Leukocytes,Ua TRACE (*)    All other components within normal limits  CBC WITH DIFFERENTIAL/PLATELET - Abnormal; Notable for the following components:     Hemoglobin 11.7 (*)    All other components within normal limits  COMPREHENSIVE METABOLIC PANEL - Abnormal; Notable for the following components:   Glucose, Bld 201 (*)    All other components within normal limits  URINE CULTURE    EKG None  Radiology No results found.  Procedures Procedures (including critical care time)  Medications Ordered in ED Medications - No data to display  ED Course  I have reviewed the triage vital signs and the nursing notes.  Pertinent labs & imaging results that were available during my care of the patient were reviewed by me and considered in my medical decision making (see chart for details).  Clinical Course as of Jun 24 1055  Wed Jun 23, 2020  1037 Glucose(!): 201 [CA]  1037 Hemoglobin(!): 11.7 [CA]  1053 Leukocytes,Ua(!): TRACE [CA]    Clinical Course User Index [CA] Suzy Bouchard, PA-C   MDM Rules/Calculators/A&P                         69 year old female presents to the ED due to vaginal pressure x1 week associated with bulging sensation in vaginal region. Denies dysuria, hematuria, vaginal discharge, fever, chills, and flank pain. Last sexually active 4 months ago. No concern for STDs. Upon arrival, vitals all within normal limits.  Patient is afebrile, not tachycardic or hypoxic.  Patient in no acute distress and non-ill-appearing.  Physical exam reassuring.  Abdomen soft, nondistended, nontender. No concern for acute abdomen. Suspect symptoms related to prolapse. Routine labs and UA obtained at triage. Will perform pelvic exam to check for prolapse. Will most likely need OBGYN referral for further evaluation. Deferred pain medication at this time.   Pelvic exam performed with chaperone in the room. Small prolapse only visible when bearing down. No tenderness. Suspect symptoms related to pelvic organ prolapse. Low suspicion  for PID. CBC reassuring with no leukocytosis.  Mild anemia with hemoglobin 11.7.  CMP reassuring with normal  renal function.  Hyperglycemia 201 likely due to known diabetes. No concern for DKA at this time.  Advised patient to follow-up with PCP for further evaluation of hyperglycemia. UA significant for glucosuria and trace leukocytes. Negative nitrites. Given patient is asymptomatic for acute cystitis will hold on antibiotics. Patient agreeable. Urine culture pending. Patient given OBGYN referral for further evaluation of prolapse. Tylenol given for pain management as needed. Instructed patient to call OBGYN today for further evaluation. Strict ED precautions discussed with patient. Patient states understanding and agrees to plan. Patient discharged home in no acute distress and stable vitals  Discussed case with Dr. Ashok Cordia who evaluated patient at bedside and agrees with assessment and plan.  Final Clinical Impression(s) / ED Diagnoses Final diagnoses:  Female genital prolapse, unspecified type  Hyperglycemia    Rx / DC Orders ED Discharge Orders         Ordered    acetaminophen (TYLENOL 8 HOUR) 650 MG CR tablet  Every 8 hours PRN        06/23/20 Frederick, Chantia Amalfitano C, PA-C 06/23/20 1059    Lajean Saver, MD 06/23/20 1331

## 2020-06-23 NOTE — ED Triage Notes (Signed)
Arrived POV, accompanied by daughter and assisting w/ translation. C/O vaginal pressure x 3 days. Stated when she is in the shower, something feels like its coming out and she pushes it back in. Denies GYN hx.

## 2020-06-24 LAB — URINE CULTURE: Culture: <10000 — AB

## 2020-06-28 ENCOUNTER — Telehealth: Payer: Self-pay | Admitting: *Deleted

## 2020-06-28 NOTE — Telephone Encounter (Signed)
Left message through interpretor to schedule a Hospital Follow Up . Patient was discharged 11/24

## 2020-07-13 ENCOUNTER — Ambulatory Visit (INDEPENDENT_AMBULATORY_CARE_PROVIDER_SITE_OTHER): Payer: Medicare Other | Admitting: Obstetrics & Gynecology

## 2020-07-13 ENCOUNTER — Other Ambulatory Visit (HOSPITAL_COMMUNITY)
Admission: RE | Admit: 2020-07-13 | Discharge: 2020-07-13 | Disposition: A | Discharge status: Home / Self Care | Payer: Medicare Other | Source: Ambulatory Visit | Attending: Obstetrics & Gynecology | Admitting: Obstetrics & Gynecology

## 2020-07-13 ENCOUNTER — Other Ambulatory Visit: Payer: Self-pay

## 2020-07-13 ENCOUNTER — Encounter: Payer: Self-pay | Admitting: Obstetrics & Gynecology

## 2020-07-13 VITALS — BP 127/57 | HR 92 | Wt 139.6 lb

## 2020-07-13 DIAGNOSIS — Z124 Encounter for screening for malignant neoplasm of cervix: Secondary | ICD-10-CM

## 2020-07-13 DIAGNOSIS — Z1151 Encounter for screening for human papillomavirus (HPV): Secondary | ICD-10-CM | POA: Insufficient documentation

## 2020-07-13 DIAGNOSIS — N812 Incomplete uterovaginal prolapse: Secondary | ICD-10-CM | POA: Diagnosis not present

## 2020-07-13 DIAGNOSIS — Z01419 Encounter for gynecological examination (general) (routine) without abnormal findings: Secondary | ICD-10-CM | POA: Insufficient documentation

## 2020-07-13 DIAGNOSIS — Z1231 Encounter for screening mammogram for malignant neoplasm of breast: Secondary | ICD-10-CM | POA: Diagnosis not present

## 2020-07-13 NOTE — Progress Notes (Addendum)
69 y.o. G65P0 Married [place] female here for new patient exam/ER follow up due to uterine prolapse.  Pt is accompanied by her daughter today.  Has been experiencing increasing vaginal pressure and bulge that worsened after doing yard work with her husband this fall.  Since that time, she has continued to feel a vaginal bulge and pressure.  It is worse after being up and active all day.  There is some actual pain at times.  Pt denies urinary symptoms and does feel she empties her bladder completely.  Has been menopausal for years.  Has never used HRT.  No LMP recorded. Patient is postmenopausal.            reports that she has been smoking cigarettes. She has a 10.75 pack-year smoking history. She has never used smokeless tobacco. She reports that she does not drink alcohol and does not use drugs.  Past Medical History:  Diagnosis Date  . Allergy   . Diabetes mellitus without complication ([place])   . GERD (gastroesophageal reflux disease)   . Hyperlipidemia   . Osteoporosis     Past Surgical History:  Procedure Laterality Date  . COLONOSCOPY     in [place]  . TUBAL LIGATION  1985    Current Outpatient Medications  Medication Sig Dispense Refill  . acetaminophen (TYLENOL 8 HOUR) 650 MG CR tablet Take 1 tablet (650 mg total) by mouth every 8 (eight) hours as needed for pain. 20 tablet 0  . atorvastatin (LIPITOR) 80 MG tablet Take 1 tablet (80 mg total) by mouth daily. 90 tablet 1  . Blood Glucose Monitoring Suppl (AGAMATRIX PRESTO) w/Device KIT Twice daily sugar checks before meals 1 kit 0  . Calcium Citrate-Vitamin D (CITRACAL/VITAMIN D) 250-200 MG-UNIT TABS 2 tabs by mouth twice daily 120 each   . cetirizine (ZYRTEC) 10 MG tablet Take 1 tablet (10 mg total) by mouth daily. 30 tablet 11  . Dulaglutide (TRULICITY) 1.5 ML/5.4GB SOPN Inject 1.5 mg into the skin once a week. 2 mL 5  . glucose blood (ONE TOUCH ULTRA TEST) test strip Use to check CBGs qam fasting, 2 hrs postprandial, & if having hyper  or hypoglycemic sxs. Dx E11.65, Z79.4 400 each 4  . ibandronate (BONIVA) 150 MG tablet Take 1 tablet (150 mg total) by mouth every 30 (thirty) days. Take in the morning with a full glass of water, on an empty stomach, and do not take anything else by mouth or lie down for the next 30 min. 3 tablet 3  . insulin glargine (LANTUS SOLOSTAR) 100 UNIT/ML Solostar Pen Inject 18 Units into the skin every morning.    . latanoprost (XALATAN) 0.005 % ophthalmic solution INSTILL 1 DROP INTO EACH EYE ONCE DAILY IN THE EVENING    . metFORMIN (GLUCOPHAGE) 1000 MG tablet TAKE 1 TABLET BY MOUTH TWICE DAILY WITH A MEAL 180 tablet 2  . mometasone (NASONEX) 50 MCG/ACT nasal spray 2 sprays each nostril daily 17 g 12  . pantoprazole (PROTONIX) 20 MG tablet Take 1 tablet (20 mg total) by mouth daily as needed for heartburn. 30 tablet 3  . cyclobenzaprine (FLEXERIL) 5 MG tablet Take 1 tablet (5 mg total) by mouth 3 (three) times daily as needed for muscle spasms. (Patient not taking: No sig reported) 30 tablet 1  . methimazole (TAPAZOLE) 5 MG tablet Take 0.5 tablets (2.5 mg total) by mouth daily. (Patient not taking: No sig reported) 45 tablet 1   No current facility-administered medications for this visit.  Family History  Problem Relation Age of Onset  . Asthma Brother   . Thyroid disease Neg Hx   . Osteoporosis Neg Hx   . Colon cancer Neg Hx   . Colon polyps Neg Hx   . Stomach cancer Neg Hx   . Esophageal cancer Neg Hx     Review of Systems  Constitutional: Negative.   Gastrointestinal: Negative.   Genitourinary: Positive for pelvic pain (pressure/discomfort). Negative for urgency and vaginal bleeding.  Neurological: Negative.   Psychiatric/Behavioral: Negative.     Exam:   BP (!) 127/57   Pulse 92   Wt 139 lb 9.6 oz (63.3 kg)   BMI 24.73 kg/m      General appearance: alert, cooperative and appears stated age Head: Normocephalic, without obvious abnormality, atraumatic Abdomen: soft,  non-tender; bowel sounds normal; no masses,  no organomegaly, no surgical scars noted Extremities: extremities normal, atraumatic, no cyanosis or edema Skin: Skin color, texture, turgor normal. No rashes or lesions Lymph nodes: Cervical, supraclavicular, and axillary nodes normal. No abnormal inguinal nodes palpated Neurologic: Grossly normal  Pelvic: External genitalia:  no lesions              Urethra:  normal appearing urethra with no masses, tenderness or lesions              Bartholins and Skenes: normal                 Vagina: atrophic changes noted, incomplete uterine prolapse noted, 3rd degree              Cervix: no lesions              Pap taken: Yes.   Bimanual Exam:  Uterus:  normal size, contour, position, consistency, mobility, non-tender              Adnexa: normal adnexa and no mass, fullness, tenderness               Rectovaginal: Confirms               Anus:  normal sphincter tone, no lesions  Chaperone, Ubaldo Glassing, CMA, was present for exam.  Assessment/Plan: 1. Incomplete uterine prolapse - Ambulatory referral to Urogynecology - Urine Culture - Pessary discussed.  Do not feel pelvic PT will really help.  2.  Health maintenance - Pap obtained today - MMG ordered  3.  Diabetes - D/w pt and daughter that better control will be needed if decides to proceed with surgery.  Last HbA1C was 8.5.  Right now, this would make her not eligible for an elective surgical repair.  They do voice understanding.  4.  Smoking - Needs to stop if at all possible prior to surgery as well  32 minutes of total time was spent for this patient encounter, including preparation, face-to-face counseling with the patient and coordination of care, and documentation of the encounter.

## 2020-07-15 LAB — URINE CULTURE

## 2020-07-16 LAB — CYTOLOGY - PAP
Comment: NEGATIVE
Diagnosis: NEGATIVE
High risk HPV: NEGATIVE

## 2020-07-26 DIAGNOSIS — Z20822 Contact with and (suspected) exposure to covid-19: Secondary | ICD-10-CM | POA: Diagnosis not present

## 2020-08-05 ENCOUNTER — Other Ambulatory Visit: Payer: Self-pay

## 2020-08-05 ENCOUNTER — Encounter: Payer: Self-pay | Admitting: Emergency Medicine

## 2020-08-05 ENCOUNTER — Ambulatory Visit (INDEPENDENT_AMBULATORY_CARE_PROVIDER_SITE_OTHER): Payer: Medicare Other | Admitting: Emergency Medicine

## 2020-08-05 VITALS — BP 136/77 | HR 87 | Temp 97.9°F | Resp 16 | Ht 63.5 in | Wt 137.0 lb

## 2020-08-05 DIAGNOSIS — Z7689 Persons encountering health services in other specified circumstances: Secondary | ICD-10-CM | POA: Diagnosis not present

## 2020-08-05 DIAGNOSIS — E1169 Type 2 diabetes mellitus with other specified complication: Secondary | ICD-10-CM | POA: Diagnosis not present

## 2020-08-05 DIAGNOSIS — Z794 Long term (current) use of insulin: Secondary | ICD-10-CM

## 2020-08-05 DIAGNOSIS — E1165 Type 2 diabetes mellitus with hyperglycemia: Secondary | ICD-10-CM

## 2020-08-05 DIAGNOSIS — E785 Hyperlipidemia, unspecified: Secondary | ICD-10-CM | POA: Diagnosis not present

## 2020-08-05 DIAGNOSIS — M810 Age-related osteoporosis without current pathological fracture: Secondary | ICD-10-CM | POA: Diagnosis not present

## 2020-08-05 MED ORDER — OLOPATADINE HCL 0.2 % OP SOLN: 1.0000 [drp] | Freq: Two times a day (BID) | OPHTHALMIC | 2 refills | Status: AC

## 2020-08-05 NOTE — Progress Notes (Signed)
Alyssa Weaver 70 y.o.   Chief Complaint  Patient presents with  . Transitions Of Care    Follow up 3 month - Former Dr Pamella Pert    HISTORY OF PRESENT ILLNESS: This is a 70 y.o. female here to establish care with me.  Used to see Dr. Pamella Pert. Patient has history of insulin-dependent diabetes on weekly Trulicity and Metformin. History of dyslipidemia on atorvastatin. History of osteoporosis on monthly Boniva. Current smoker about 5 cigarettes/day. History of hyperthyroidism. Has no complaints or medical concerns today.  HPI   Prior to Admission medications   Medication Sig Start Date End Date Taking? Authorizing Provider  atorvastatin (LIPITOR) 80 MG tablet Take 1 tablet (80 mg total) by mouth daily. 01/26/20  Yes Jacelyn Pi, Irma M, MD  Blood Glucose Monitoring Suppl [facility] PREST[facility]) w/Device KIT Twice daily sugar checks before meals 04/18/17  Yes Mack Hook, MD  Calcium Citrate-Vitamin D (CITRACAL/VITAMIN D) 250-200 MG-UNIT TABS 2 tabs by mouth twice daily 08/24/17  Yes Mack Hook, MD  cetirizine (ZYRTEC) 10 MG tablet Take 1 tablet (10 mg total) by mouth daily. 11/10/19  Yes Maximiano Coss, NP  cyclobenzaprine (FLEXERIL) 5 MG tablet Take 1 tablet (5 mg total) by mouth 3 (three) times daily as needed for muscle spasms. 01/26/20  Yes Jacelyn Pi, Irma M, MD  Dulaglutide (TRULICITY) 1.5 GE/9.5MW SOPN Inject 1.5 mg into the skin once a week. 05/03/20  Yes Jacelyn Pi, Lilia Argue, MD  Ergocalciferol (VITAMIN D2) 50 MCG (2000 UT) TABS Take by mouth daily.   Yes [provider]  glucose blood (ONE TOUCH ULTRA TEST) test strip Use to check CBGs qam fasting, 2 hrs postprandial, & if having hyper or hypoglycemic sxs. Dx E11.65, Z79.4 08/25/19  Yes Jacelyn Pi, Lilia Argue, MD  ibandronate (BONIVA) 150 MG tablet Take 1 tablet (150 mg total) by mouth every 30 (thirty) days. Take in the morning with a full glass of water, on an empty stomach, and do not take anything  else by mouth or lie down for the next 30 min. 05/03/20  Yes Jacelyn Pi, Irma M, MD  insulin glargine (LANTUS SOLOSTAR) 100 UNIT/ML Solostar Pen Inject 18 Units into the skin every morning. 05/03/20  Yes Jacelyn Pi, Irma M, MD  latanoprost (XALATAN) 0.005 % ophthalmic solution INSTILL 1 DROP INTO EACH EYE ONCE DAILY IN THE EVENING 04/30/19  Yes [provider]  metFORMIN (GLUCOPHAGE) 1000 MG tablet TAKE 1 TABLET BY MOUTH TWICE DAILY WITH A MEAL 02/23/20  Yes Jacelyn Pi, Irma M, MD  mometasone [facility]) 50 MCG/ACT nasal spray 2 sprays each nostril daily 10/09/19  Yes Jacelyn Pi, Lilia Argue, MD  pantoprazole (PROTONIX) 20 MG tablet Take 1 tablet (20 mg total) by mouth daily as needed for heartburn. 07/17/19  Yes Jacelyn Pi, Lilia Argue, MD  acetaminophen (TYLENOL 8 HOUR) 650 MG CR tablet Take 1 tablet (650 mg total) by mouth every 8 (eight) hours as needed for pain. Patient not taking: Reported on 08/05/2020 06/23/20   Suzy Bouchard, PA-C    No Known Allergies  Patient Active Problem List   Diagnosis Date Noted  . Age-related osteoporosis without current pathological fracture 05/20/2019  . Hyperthyroidism 05/20/2019  . Tobacco use disorder 01/07/2018  . Abnormal TSH 01/07/2018  . Seasonal allergic rhinitis due to pollen 01/07/2018  . Environmental allergies   . Type 2 diabetes mellitus with hyperglycemia, with long-term current use of insulin ([address]) 01/04/2016  . Hyperlipidemia 01/04/2016    Past Medical History:  Diagnosis Date  . Allergy   . Diabetes mellitus without complication ([place])   . GERD (gastroesophageal reflux disease)   . Hyperlipidemia   . Hypertension   . Osteoporosis     Past Surgical History:  Procedure Laterality Date  . APPENDECTOMY  1978  . COLONOSCOPY     in [place]  . TUBAL LIGATION  1985    Social History   Socioeconomic History  . Marital status: Married    Spouse name: [name]  . Number of children: 5  . Years of education: 70  . Highest  education level: Not on file  Occupational History  . Occupation: housewife  Tobacco Use  . Smoking status: Current Every Day Smoker    Packs/day: 0.25    Years: 43.00    Pack years: 10.75    Types: Cigarettes  . Smokeless tobacco: Never Used  . Tobacco comment: Husband smokes 1 ppd.  He might be willing to quit at same time.  Vaping Use  . Vaping Use: Never used  Substance and Sexual Activity  . Alcohol use: No  . Drug use: No  . Sexual activity: Yes    Birth control/protection: Post-menopausal  Other Topics Concern  . Not on file  Social History Narrative   Originally from [place]   Refugees--lived in [place] 2 years.   Came to the [place] In [place].   Lived in the [place] until moved to [facility] Nov. 2016   Social Determinants of Health   Financial Resource Strain: Not on file  Food Insecurity: Not on file  Transportation Needs: Not on file  Physical Activity: Not on file  Stress: Not on file  Social Connections: Not on file  Intimate Partner Violence: Not on file    Family History  Problem Relation Age of Onset  . Asthma Brother   . Thyroid disease Neg Hx   . Osteoporosis Neg Hx   . Colon cancer Neg Hx   . Colon polyps Neg Hx   . Stomach cancer Neg Hx   . Esophageal cancer Neg Hx      Review of Systems  Constitutional: Negative.  Negative for chills and fever.  HENT: Negative.  Negative for congestion and sore throat.   Respiratory: Negative.  Negative for cough and shortness of breath.   Cardiovascular: Negative.  Negative for chest pain and palpitations.  Gastrointestinal: Negative.  Negative for abdominal pain, blood in stool, diarrhea, melena, nausea and vomiting.  Genitourinary: Negative.  Negative for dysuria and hematuria.  Musculoskeletal: Negative.  Negative for back pain, myalgias and neck pain.  Skin: Negative.  Negative for rash.  Neurological: Negative.  Negative for dizziness and headaches.  All other systems reviewed and are negative.  Today's  Vitals   08/05/20 1526  BP: 136/77  Pulse: 87  Resp: 16  Temp: 97.9 F (36.6 C)  TempSrc: Temporal  SpO2: 97%  Weight: 137 lb (62.1 kg)  Height: 5' 3.5" (1.613 m)   Body mass index is 23.89 kg/m.   Physical Exam Vitals reviewed.  Constitutional:      Appearance: Normal appearance.  HENT:     Head: Normocephalic.  Eyes:     Extraocular Movements: Extraocular movements intact.     Pupils: Pupils are equal, round, and reactive to light.  Cardiovascular:     Rate and Rhythm: Normal rate and regular rhythm.     Pulses: Normal pulses.     Heart sounds: Normal heart sounds.  Pulmonary:     Effort: Pulmonary effort  is normal.     Breath sounds: Normal breath sounds.  Musculoskeletal:        General: Normal range of motion.     Cervical back: Normal range of motion and neck supple.  Skin:    General: Skin is warm and dry.     Capillary Refill: Capillary refill takes less than 2 seconds.  Neurological:     General: No focal deficit present.     Mental Status: She is alert and oriented to person, place, and time.  Psychiatric:        Mood and Affect: Mood normal.        Behavior: Behavior normal.      ASSESSMENT & PLAN: Lissy was seen today for transitions of care and constipation.  Diagnoses and all orders for this visit:  Encounter to establish care  Type 2 diabetes mellitus with hyperglycemia, with long-term current use of insulin ([place])  Encounter for long-term (current) use of insulin ([place])  Dyslipidemia associated with type 2 diabetes mellitus ([place])  Age-related osteoporosis without current pathological fracture  Other orders -     Olopatadine HCl (PATADAY) 0.2 % SOLN; Apply 1 drop to eye 2 (two) times daily.    Patient Instructions       If you have lab work done today you will be contacted with your lab results within the next 2 weeks.  If you have not heard from [place] then please contact us. The fastest way to get your results is to register for  My Chart.   IF you received an x-ray today, you will receive an invoice from [facility]. Please contact [facility] at [phone] with questions or concerns regarding your invoice.   IF you received labwork today, you will receive an invoice from [place]. Please contact [facility] at [phone] with questions or concerns regarding your invoice.   Our billing staff will not be able to assist you with questions regarding bills from these companies.  You will be contacted with the lab results as soon as they are available. The fastest way to get your results is to activate your My Chart account. Instructions are located on the last page of this paperwork. If you have not heard from [place] regarding the results in 2 weeks, please contact this office.     Health Maintenance After Age 106 After age 16, you are at a higher risk for certain long-term diseases and infections as well as injuries from falls. Falls are a major cause of broken bones and head injuries in people who are older than age 75. Getting regular preventive care can help to keep you healthy and well. Preventive care includes getting regular testing and making lifestyle changes as recommended by your health care provider. Talk with your health care provider about:  Which screenings and tests you should have. A screening is a test that checks for a disease when you have no symptoms.  A diet and exercise plan that is right for you. What should I know about screenings and tests to prevent falls? Screening and testing are the best ways to find a health problem early. Early diagnosis and treatment give you the best chance of managing medical conditions that are common after age 68. Certain conditions and lifestyle choices may make you more likely to have a fall. Your health care provider may recommend:  Regular vision checks. Poor vision and conditions such as cataracts can make you more likely to have a fall. If you wear  glasses,  make sure to get your prescription updated if your vision changes.  Medicine review. Work with your health care provider to regularly review all of the medicines you are taking, including over-the-counter medicines. Ask your health care provider about any side effects that may make you more likely to have a fall. Tell your health care provider if any medicines that you take make you feel dizzy or sleepy.  Osteoporosis screening. Osteoporosis is a condition that causes the bones to get weaker. This can make the bones weak and cause them to break more easily.  Blood pressure screening. Blood pressure changes and medicines to control blood pressure can make you feel dizzy.  Strength and balance checks. Your health care provider may recommend certain tests to check your strength and balance while standing, walking, or changing positions.  Foot health exam. Foot pain and numbness, as well as not wearing proper footwear, can make you more likely to have a fall.  Depression screening. You may be more likely to have a fall if you have a fear of falling, feel emotionally low, or feel unable to do activities that you used to do.  Alcohol use screening. Using too much alcohol can affect your balance and may make you more likely to have a fall. What actions can I take to lower my risk of falls? General instructions  Talk with your health care provider about your risks for falling. Tell your health care provider if: ? You fall. Be sure to tell your health care provider about all falls, even ones that seem minor. ? You feel dizzy, sleepy, or off-balance.  Take over-the-counter and prescription medicines only as told by your health care provider. These include any supplements.  Eat a healthy diet and maintain a healthy weight. A healthy diet includes low-fat dairy products, low-fat (lean) meats, and fiber from whole grains, beans, and lots of fruits and vegetables. Home safety  Remove any  tripping hazards, such as rugs, cords, and clutter.  Install safety equipment such as grab bars in bathrooms and safety rails on stairs.  Keep rooms and walkways well-lit. Activity   Follow a regular exercise program to stay fit. This will help you maintain your balance. Ask your health care provider what types of exercise are appropriate for you.  If you need a cane or walker, use it as recommended by your health care provider.  Wear supportive shoes that have nonskid soles. Lifestyle  Do not drink alcohol if your health care provider tells you not to drink.  If you drink alcohol, limit how much you have: ? 0-1 drink a day for women. ? 0-2 drinks a day for men.  Be aware of how much alcohol is in your drink. In the [place] one drink equals one typical bottle of beer (12 oz), one-half glass of wine (5 oz), or one shot of hard liquor (1 oz).  Do not use any products that contain nicotine or tobacco, such as cigarettes and e-cigarettes. If you need help quitting, ask your health care provider. Summary  Having a healthy lifestyle and getting preventive care can help to protect your health and wellness after age 7.  Screening and testing are the best way to find a health problem early and help you avoid having a fall. Early diagnosis and treatment give you the best chance for managing medical conditions that are more common for people who are older than age 72.  Falls are a major cause of broken bones and head injuries in  people who are older than age 62. Take precautions to prevent a fall at home.  Work with your health care provider to learn what changes you can make to improve your health and wellness and to prevent falls. This information is not intended to replace advice given to you by your health care provider. Make sure you discuss any questions you have with your health care provider. Document Revised: 11/07/2018 Document Reviewed: 05/30/2017 Elsevier Patient Education  2020  Elsevier Inc.      Agustina Caroli, MD [facility]

## 2020-08-05 NOTE — Patient Instructions (Addendum)
   If you have lab work done today you will be contacted with your lab results within the next 2 weeks.  If you have not heard from us then please contact us. The fastest way to get your results is to register for My Chart.   IF you received an x-ray today, you will receive an invoice from [facility]. Please contact [facility] at [phone] with questions or concerns regarding your invoice.   IF you received labwork today, you will receive an invoice from [facility]. Please contact [facility] at [phone] with questions or concerns regarding your invoice.   Our billing staff will not be able to assist you with questions regarding bills from these companies.  You will be contacted with the lab results as soon as they are available. The fastest way to get your results is to activate your My Chart account. Instructions are located on the last page of this paperwork. If you have not heard from us regarding the results in 2 weeks, please contact this office.     Health Maintenance After Age 65 After age 65, you are at a higher risk for certain long-term diseases and infections as well as injuries from falls. Falls are a major cause of broken bones and head injuries in people who are older than age 65. Getting regular preventive care can help to keep you healthy and well. Preventive care includes getting regular testing and making lifestyle changes as recommended by your health care provider. Talk with your health care provider about:  Which screenings and tests you should have. A screening is a test that checks for a disease when you have no symptoms.  A diet and exercise plan that is right for you. What should I know about screenings and tests to prevent falls? Screening and testing are the best ways to find a health problem early. Early diagnosis and treatment give you the best chance of managing medical conditions that are common after age 65. Certain conditions and  lifestyle choices may make you more likely to have a fall. Your health care provider may recommend:  Regular vision checks. Poor vision and conditions such as cataracts can make you more likely to have a fall. If you wear glasses, make sure to get your prescription updated if your vision changes.  Medicine review. Work with your health care provider to regularly review all of the medicines you are taking, including over-the-counter medicines. Ask your health care provider about any side effects that may make you more likely to have a fall. Tell your health care provider if any medicines that you take make you feel dizzy or sleepy.  Osteoporosis screening. Osteoporosis is a condition that causes the bones to get weaker. This can make the bones weak and cause them to break more easily.  Blood pressure screening. Blood pressure changes and medicines to control blood pressure can make you feel dizzy.  Strength and balance checks. Your health care provider may recommend certain tests to check your strength and balance while standing, walking, or changing positions.  Foot health exam. Foot pain and numbness, as well as not wearing proper footwear, can make you more likely to have a fall.  Depression screening. You may be more likely to have a fall if you have a fear of falling, feel emotionally low, or feel unable to do activities that you used to do.  Alcohol use screening. Using too much alcohol can affect your balance and may make you more likely to   have a fall. What actions can I take to lower my risk of falls? General instructions  Talk with your health care provider about your risks for falling. Tell your health care provider if: ? You fall. Be sure to tell your health care provider about all falls, even ones that seem minor. ? You feel dizzy, sleepy, or off-balance.  Take over-the-counter and prescription medicines only as told by your health care provider. These include any  supplements.  Eat a healthy diet and maintain a healthy weight. A healthy diet includes low-fat dairy products, low-fat (lean) meats, and fiber from whole grains, beans, and lots of fruits and vegetables. Home safety  Remove any tripping hazards, such as rugs, cords, and clutter.  Install safety equipment such as grab bars in bathrooms and safety rails on stairs.  Keep rooms and walkways well-lit. Activity   Follow a regular exercise program to stay fit. This will help you maintain your balance. Ask your health care provider what types of exercise are appropriate for you.  If you need a cane or walker, use it as recommended by your health care provider.  Wear supportive shoes that have nonskid soles. Lifestyle  Do not drink alcohol if your health care provider tells you not to drink.  If you drink alcohol, limit how much you have: ? 0-1 drink a day for women. ? 0-2 drinks a day for men.  Be aware of how much alcohol is in your drink. In the [place] one drink equals one typical bottle of beer (12 oz), one-half glass of wine (5 oz), or one shot of hard liquor (1 oz).  Do not use any products that contain nicotine or tobacco, such as cigarettes and e-cigarettes. If you need help quitting, ask your health care provider. Summary  Having a healthy lifestyle and getting preventive care can help to protect your health and wellness after age 65.  Screening and testing are the best way to find a health problem early and help you avoid having a fall. Early diagnosis and treatment give you the best chance for managing medical conditions that are more common for people who are older than age 65.  Falls are a major cause of broken bones and head injuries in people who are older than age 65. Take precautions to prevent a fall at home.  Work with your health care provider to learn what changes you can make to improve your health and wellness and to prevent falls. This information is not intended  to replace advice given to you by your health care provider. Make sure you discuss any questions you have with your health care provider. Document Revised: 11/07/2018 Document Reviewed: 05/30/2017 Elsevier Patient Education  2020 Elsevier Inc.  

## 2020-08-06 ENCOUNTER — Other Ambulatory Visit: Payer: Self-pay | Admitting: Emergency Medicine

## 2020-08-06 DIAGNOSIS — E782 Mixed hyperlipidemia: Secondary | ICD-10-CM

## 2020-08-06 MED ORDER — ATORVASTATIN CALCIUM 80 MG PO TABS: 80.0000 mg | ORAL_TABLET | Freq: Every day | ORAL | 1 refills | Status: DC

## 2020-08-06 NOTE — Telephone Encounter (Signed)
Medication: atorvastatin (LIPITOR) 80 MG tablet [[id number]] ,   Has the patient contacted their pharmacy? YES  (Agent: If no, request that the patient contact the pharmacy for the refill.) (Agent: If yes, when and what did the pharmacy advise?)  Preferred Pharmacy (with phone number or street name): [place], [address] [place], [place] [address]  Phone:  [phone] Fax:  [phone]  Agent: Please be advised that RX refills may take up to 3 business days. We ask that you follow-up with your pharmacy.

## 2020-08-09 DIAGNOSIS — Z1152 Encounter for screening for COVID-19: Secondary | ICD-10-CM | POA: Diagnosis not present

## 2020-09-03 ENCOUNTER — Other Ambulatory Visit: Payer: Self-pay

## 2020-09-03 ENCOUNTER — Ambulatory Visit (INDEPENDENT_AMBULATORY_CARE_PROVIDER_SITE_OTHER): Payer: Medicare Other | Admitting: Family Medicine

## 2020-09-03 ENCOUNTER — Encounter: Payer: Self-pay | Admitting: Family Medicine

## 2020-09-03 VITALS — BP 110/66 | HR 87 | Temp 97.3°F | Ht 63.0 in | Wt 136.6 lb

## 2020-09-03 DIAGNOSIS — E1165 Type 2 diabetes mellitus with hyperglycemia: Secondary | ICD-10-CM | POA: Diagnosis not present

## 2020-09-03 DIAGNOSIS — Z794 Long term (current) use of insulin: Secondary | ICD-10-CM | POA: Diagnosis not present

## 2020-09-03 DIAGNOSIS — E782 Mixed hyperlipidemia: Secondary | ICD-10-CM | POA: Diagnosis not present

## 2020-09-03 DIAGNOSIS — M810 Age-related osteoporosis without current pathological fracture: Secondary | ICD-10-CM | POA: Diagnosis not present

## 2020-09-03 LAB — POCT GLYCOSYLATED HEMOGLOBIN (HGB A1C): Hemoglobin A1C: 8.2 % — AB (ref 4.0–5.6)

## 2020-09-03 MED ORDER — IBANDRONATE SODIUM 150 MG PO TABS: 150.0000 mg | ORAL_TABLET | ORAL | 3 refills | Status: DC

## 2020-09-03 MED ORDER — TRULICITY 3 MG/0.5ML ~~LOC~~ SOAJ: 3.0000 mg | SUBCUTANEOUS | 5 refills | Status: DC

## 2020-09-03 NOTE — Patient Instructions (Addendum)
Melatonin 3mg  30-60 min before bedtime    [facility] [address] [place], [place], [place] [address] [phone]  [facility] [address] [place], [place], [place] [address] [phone]   Dr. Renato Shin - endo - [phone]

## 2020-09-03 NOTE — Progress Notes (Signed)
Chief Complaint  Patient presents with  . Establish Care    NP- establish care. No concerns  she needs refills on Trulicity, Pantoprazole, Metformin, and Atorvastatin.      HPI: *Alyssa Weaver is a 70 y.o. female here as a new patient to establish care (previously seen at [place] [facility]) for DM, HLD follow-up. She is accompanied by her daughter as well as an interpretor.   For her DM, pt is on Trulicity 5.0VW weekly, metformin 1042m BID, and lantus 18 units daily  For HLD, pt is on atorvastatin 858mdaily. Pt does check BS at home. Readings: fasting 97-153  Lab Results  Component Value Date   HGBA1C 8.2 (A) 09/03/2020  previously 8.5 in 04/2020  Lab Results  Component Value Date   MICROALBUR neg 11/19/2019   Lab Results  Component Value Date   CREATININE 0.66 06/23/2020   Lab Results  Component Value Date   CHOL 106 05/03/2020   HDL 36 (L) 05/03/2020   LDLCALC 43 05/03/2020   TRIG 160 (H) 05/03/2020   CHOLHDL 2.9 05/03/2020    The ASCVD Risk score (GMikey BussingC Jr., et al., 2013) failed to calculate for the following reasons:   The valid total cholesterol range is 130 to 320 mg/dL   Past Medical History:  Diagnosis Date  . Allergy   . Diabetes mellitus without complication ([place]  . GERD (gastroesophageal reflux disease)   . Hyperlipidemia   . Hypertension   . Osteoporosis     Past Surgical History:  Procedure Laterality Date  . APPENDECTOMY  1978  . [place]IG[place]  1985    Social History   Socioeconomic History  . Marital status: Married    Spouse name: SoMargorie John. Number of children: 5  . Years of education: 1321. Highest education level: Not on file  Occupational History  . Occupation: housewife  Tobacco Use  . Smoking status: Current Every Day Smoker    Packs/day: 0.25    Years: 43.00    Pack years: 10.75    Types: Cigarettes  . Smokeless tobacco: Never Used  . Tobacco comment: Husband smokes 1 ppd.  He  might be willing to quit at same time.  Vaping Use  . Vaping Use: Never used  Substance and Sexual Activity  . Alcohol use: No  . Drug use: No  . Sexual activity: Yes    Birth control/protection: Post-menopausal  Other Topics Concern  . Not on file  Social History Narrative   [place]   [place]   [place] [facility] [place]   Social Determinants of Health   Financial Resource Strain: Not on file  Food Insecurity: Not on file  Transportation Needs: Not on file  Physical Activity: Not on file  Stress: Not on file  Social Connections: Not on file  Intimate Partner Violence: Not on file    Family History  Problem Relation Age of Onset  . Asthma Brother   . Thyroid disease Neg Hx   . Osteoporosis Neg Hx   . Colon cancer Neg Hx   . Colon polyps Neg Hx   . Stomach cancer Neg Hx   . Esophageal cancer Neg Hx      Immunization History  Administered Date(s) Administered  . Fluad Quad(high Dose 65+) 05/03/2020  . Influenza,  High Dose Seasonal PF 06/10/2018, 03/15/2019, 05/02/2020  . Influenza-Unspecified 06/30/2016, 04/19/2017, 03/16/2019  . PFIZER(Purple Top)SARS-COV-2 Vaccination 09/29/2019, 10/28/2019, 05/17/2020  . Pneumococcal Conjugate-13 12/07/2017  . Pneumococcal Polysaccharide-23 02/18/2019, 03/15/2019  . Zoster Recombinat (Shingrix) 01/07/2018, 04/02/2018    Outpatient Encounter Medications as of 09/03/2020  Medication Sig  . atorvastatin (LIPITOR) 80 MG tablet Take 1 tablet (80 mg total) by mouth daily.  . Blood Glucose Monitoring Suppl (AGAMATRIX PRESTO) w/Device KIT Twice daily sugar checks before meals  . Calcium Citrate-Vitamin D (CITRACAL/VITAMIN D) 250-200 MG-UNIT TABS 2 tabs by mouth twice daily  . cetirizine (ZYRTEC) 10 MG tablet Take 1 tablet (10 mg total) by mouth daily.  . cyclobenzaprine (FLEXERIL) 5 MG tablet Take 1 tablet (5 mg total) by mouth 3 (three)  times daily as needed for muscle spasms.  . Dulaglutide (TRULICITY) 1.5 IY/6.4BR SOPN Inject 1.5 mg into the skin once a week.  . Ergocalciferol (VITAMIN D2) 50 MCG (2000 UT) TABS Take by mouth daily.  Marland Kitchen glucose blood (ONE TOUCH ULTRA TEST) test strip Use to check CBGs qam fasting, 2 hrs postprandial, & if having hyper or hypoglycemic sxs. Dx E11.65, Z79.4  . ibandronate (BONIVA) 150 MG tablet Take 1 tablet (150 mg total) by mouth every 30 (thirty) days. Take in the morning with a full glass of water, on an empty stomach, and do not take anything else by mouth or lie down for the next 30 min.  . insulin glargine (LANTUS SOLOSTAR) 100 UNIT/ML Solostar Pen Inject 18 Units into the skin every morning.  . latanoprost (XALATAN) 0.005 % ophthalmic solution INSTILL 1 DROP INTO EACH EYE ONCE DAILY IN THE EVENING  . metFORMIN (GLUCOPHAGE) 1000 MG tablet TAKE 1 TABLET BY MOUTH TWICE DAILY WITH A MEAL  . mometasone (NASONEX) 50 MCG/ACT nasal spray 2 sprays each nostril daily  . Olopatadine HCl (PATADAY) 0.2 % SOLN Apply 1 drop to eye 2 (two) times daily.  . pantoprazole (PROTONIX) 20 MG tablet Take 1 tablet (20 mg total) by mouth daily as needed for heartburn.  Marland Kitchen acetaminophen (TYLENOL 8 HOUR) 650 MG CR tablet Take 1 tablet (650 mg total) by mouth every 8 (eight) hours as needed for pain. (Patient not taking: No sig reported)   No facility-administered encounter medications on file as of 09/03/2020.     ROS: Pertinent positives and negatives noted in HPI. Remainder of ROS non-contributory    No Known Allergies  BP 110/66   Pulse 87   Temp (!) 97.3 F (36.3 C) (Temporal)   Ht '5\' 3"'  (1.6 m)   Wt 136 lb 9.6 oz (62 kg)   SpO2 93%   BMI 24.20 kg/m   Wt Readings from Last 3 Encounters:  09/03/20 136 lb 9.6 oz (62 kg)  08/05/20 137 lb (62.1 kg)  07/13/20 139 lb 9.6 oz (63.3 kg)   Temp Readings from Last 3 Encounters:  09/03/20 (!) 97.3 F (36.3 C) (Temporal)  08/05/20 97.9 F (36.6 C)  (Temporal)  06/23/20 98.4 F (36.9 C) (Oral)   BP Readings from Last 3 Encounters:  09/03/20 110/66  08/05/20 136/77  07/13/20 (!) 127/57   Pulse Readings from Last 3 Encounters:  09/03/20 87  08/05/20 87  07/13/20 92    Physical Exam Constitutional:      General: She is not in acute distress.    Appearance: Normal appearance. She is normal weight. She is not ill-appearing.  Cardiovascular:     Rate and Rhythm: Normal rate and regular rhythm.  Pulses: Normal pulses.  Pulmonary:     Effort: Pulmonary effort is normal.     Breath sounds: Normal breath sounds.  Musculoskeletal:     Right lower leg: No edema.     Left lower leg: No edema.  Neurological:     Mental Status: She is alert and oriented to person, place, and time.  Psychiatric:        Mood and Affect: Mood normal.        Behavior: Behavior normal.      A/P:  1. Type 2 diabetes mellitus with hyperglycemia, with long-term current use of insulin (HCC) - POCT HgB A1C = 8.2 (previously 8.5 in 04/2020) - cont metformin 1017m BID, lantus 18 units daily Increase: - Dulaglutide (TRULICITY) 3 MFU/0.7KTSOPN; Inject 3 mg as directed once a week.  Dispense: 2 mL; Refill: 5 - increased from 1.537mweekly - cont to check BS BID and keep log - cont lipitor 8057maily - low carb diet recommended - f/u in 3 mo or sooner PRN  2. Mixed hyperlipidemia - LDL at goal on last FLP in 04/2020 - cont lipitor 87m34mily  3. Age-related osteoporosis without current pathological fracture - due in 04/2021 for Dexa - cont daily calcium and Vit D supplements Refill: - ibandronate (BONIVA) 150 MG tablet; Take 1 tablet (150 mg total) by mouth every 30 (thirty) days. Take in the morning with a full glass of water, on an empty stomach, and do not take anything else by mouth or lie down for the next 30 min.  Dispense: 3 tablet; Refill: 3    This visit occurred during the SARS-CoV-2 public health emergency.  Safety protocols were in  place, including screening questions prior to the visit, additional usage of staff PPE, and extensive cleaning of exam room while observing appropriate contact time as indicated for disinfecting solutions.

## 2020-10-05 DIAGNOSIS — K120 Recurrent oral aphthae: Secondary | ICD-10-CM | POA: Diagnosis not present

## 2020-10-25 ENCOUNTER — Other Ambulatory Visit: Payer: Self-pay

## 2020-10-25 ENCOUNTER — Ambulatory Visit: Payer: Medicare Other | Admitting: Endocrinology

## 2020-10-25 VITALS — BP 120/68 | HR 92 | Ht 63.0 in | Wt 136.0 lb

## 2020-10-25 DIAGNOSIS — E0590 Thyrotoxicosis, unspecified without thyrotoxic crisis or storm: Secondary | ICD-10-CM

## 2020-10-25 DIAGNOSIS — M810 Age-related osteoporosis without current pathological fracture: Secondary | ICD-10-CM

## 2020-10-25 LAB — TSH: TSH: 0.27 u[IU]/mL — ABNORMAL LOW (ref 0.35–4.50)

## 2020-10-25 LAB — T4, FREE: Free T4: 0.98 ng/dL (ref 0.60–1.60)

## 2020-10-25 LAB — VITAMIN D 25 HYDROXY (VIT D DEFICIENCY, FRACTURES): VITD: 54.06 ng/mL (ref 30.00–100.00)

## 2020-10-25 NOTE — Patient Instructions (Addendum)
Blood tests are requested for you today.  We'll let you know about the results.  If it is overactive again, I will prescribe for you a different type of daily medication.   Please come back for a follow-up appointment in 3 months.     ????????????????????????????????????. ????????????????????????????????????????. ???????????????????????, ????????????????????????????. ???????????????????????????????????????? 3 ?????.

## 2020-10-25 NOTE — Progress Notes (Signed)
Subjective:    Patient ID: Alyssa Weaver, female    DOB: 02-17-1951, 70 y.o.   MRN: [id number]  HPI husband translates.  Pt returns for f/u of hyperthyroidism( dx'ed 2019; she was rx'ed tapazole in 2020 (she declines RAI rx); US showed MNG; bxs of 2 left sided nodules in 2018 showed beth cat 2: [place] says no f/u is needed; 2 weeks after starting tapazole, she developed slight pain at the mid-abdomen, but no assoc n/v.  sxs resolved after stopping it).    Pt returns for f/u of osteoporosis: Dx'ed: 2020 Secondary cause: smoking and hyperthyroidism.  Fractures: none Past rx: she took alendronate for a few weeks in 2020 Current rx: Boniva since 2020, and Vit-D, 2000 units/d Last DEXA result (2020) worst T-score (spine) was -2.5 Other: none Interval hx: pt states she feels well in general.   Past Medical History:  Diagnosis Date  . Allergy   . Diabetes mellitus without complication ([place])   . GERD (gastroesophageal reflux disease)   . Hyperlipidemia   . Hypertension   . Osteoporosis     Past Surgical History:  Procedure Laterality Date  . APPENDECTOMY  1978  . COLONOSCOPY     in [place]  . TUBAL LIGATION  1985    Social History   Socioeconomic History  . Marital status: Married    Spouse name: [name]  . Number of children: 5  . Years of education: 52  . Highest education level: Not on file  Occupational History  . Occupation: housewife  Tobacco Use  . Smoking status: Current Every Day Smoker    Packs/day: 0.25    Years: 43.00    Pack years: 10.75    Types: Cigarettes  . Smokeless tobacco: Never Used  . Tobacco comment: Husband smokes 1 ppd.  He might be willing to quit at same time.  Vaping Use  . Vaping Use: Never used  Substance and Sexual Activity  . Alcohol use: No  . Drug use: No  . Sexual activity: Yes    Birth control/protection: Post-menopausal  Other Topics Concern  . Not on file  Social History Narrative   Originally from [place]--lived in  [place] 2 years.   Came to the [place] In [place].   Lived in the [place] until moved to [facility] [place] [facility]   Social Determinants of Health   Financial Resource Strain: Not on file  Food Insecurity: Not on file  Transportation Needs: Not on file  Physical Activity: Not on file  Stress: Not on file  Social Connections: Not on file  Intimate Partner Violence: Not on file    Current Outpatient Medications on File Prior to Visit  Medication Sig Dispense Refill  . acetaminophen (TYLENOL 8 HOUR) 650 MG CR tablet Take 1 tablet (650 mg total) by mouth every 8 (eight) hours as needed for pain. 20 tablet 0  . atorvastatin (LIPITOR) 80 MG tablet Take 1 tablet (80 mg total) by mouth daily. 90 tablet 1  . Blood Glucose Monitoring Suppl (AGAMATRIX PRESTO) w/Device KIT Twice daily sugar checks before meals 1 kit 0  . Calcium Citrate-Vitamin D (CITRACAL/VITAMIN D) 250-200 MG-UNIT TABS 2 tabs by mouth twice daily 120 each   . cetirizine (ZYRTEC) 10 MG tablet Take 1 tablet (10 mg total) by mouth daily. 30 tablet 11  . cyclobenzaprine (FLEXERIL) 5 MG tablet Take 1 tablet (5 mg total) by mouth 3 (three) times daily as needed for muscle spasms. 30 tablet 1  . Dulaglutide (  TRULICITY) 3 GK/8.1PT SOPN Inject 3 mg as directed once a week. 2 mL 5  . Ergocalciferol (VITAMIN D2) 50 MCG (2000 UT) TABS Take by mouth daily.    Marland Kitchen glucose blood (ONE TOUCH ULTRA TEST) test strip Use to check CBGs qam fasting, 2 hrs postprandial, & if having hyper or hypoglycemic sxs. Dx E11.65, Z79.4 400 each 4  . ibandronate (BONIVA) 150 MG tablet Take 1 tablet (150 mg total) by mouth every 30 (thirty) days. Take in the morning with a full glass of water, on an empty stomach, and do not take anything else by mouth or lie down for the next 30 min. 3 tablet 3  . insulin glargine (LANTUS SOLOSTAR) 100 UNIT/ML Solostar Pen Inject 18 Units into the skin every morning.    . latanoprost (XALATAN) 0.005 % ophthalmic solution INSTILL 1 DROP INTO EACH  EYE ONCE DAILY IN THE EVENING    . metFORMIN (GLUCOPHAGE) 1000 MG tablet TAKE 1 TABLET BY MOUTH TWICE DAILY WITH A MEAL 180 tablet 2  . mometasone (NASONEX) 50 MCG/ACT nasal spray 2 sprays each nostril daily 17 g 12  . Olopatadine HCl (PATADAY) 0.2 % SOLN Apply 1 drop to eye 2 (two) times daily. 2.5 mL 2  . pantoprazole (PROTONIX) 20 MG tablet Take 1 tablet (20 mg total) by mouth daily as needed for heartburn. 30 tablet 3   No current facility-administered medications on file prior to visit.    No Known Allergies  Family History  Problem Relation Age of Onset  . Asthma Brother   . Thyroid disease Neg Hx   . Osteoporosis Neg Hx   . Colon cancer Neg Hx   . Colon polyps Neg Hx   . Stomach cancer Neg Hx   . Esophageal cancer Neg Hx     BP 120/68 (BP Location: Right Arm, Patient Position: Sitting, Cuff Size: Normal)   Pulse 92   Ht '5\' 3"'  (1.6 m)   Wt 136 lb (61.7 kg)   SpO2 97%   BMI 24.09 kg/m    Review of Systems     Objective:   Physical Exam VITAL SIGNS:  See vs page GENERAL: no distress Pulses: dorsalis pedis intact bilat.   MSK: no deformity of the feet CV: no leg edema Skin:  no ulcer on the feet.  normal color and temp on the feet. Neuro: sensation is intact to touch on the feet   Lab Results  Component Value Date   TSH 0.27 (L) 10/25/2020       Assessment & Plan:  Hyperthyroidism, uncontrolled off rx.  I rx'ed PTU.   Osteoporosis: due to recheck labs.

## 2020-10-27 LAB — PTH, INTACT AND CALCIUM
Calcium: 9.5 mg/dL (ref 8.6–10.4)
PTH: 10 pg/mL — ABNORMAL LOW (ref 16–77)

## 2020-10-27 MED ORDER — PROPYLTHIOURACIL 50 MG PO TABS: 50.0000 mg | ORAL_TABLET | Freq: Every day | ORAL | 3 refills | Status: DC

## 2020-12-03 ENCOUNTER — Other Ambulatory Visit: Payer: Self-pay

## 2020-12-03 ENCOUNTER — Ambulatory Visit (INDEPENDENT_AMBULATORY_CARE_PROVIDER_SITE_OTHER): Payer: Medicare Other | Admitting: Family Medicine

## 2020-12-03 ENCOUNTER — Encounter: Payer: Self-pay | Admitting: Family Medicine

## 2020-12-03 VITALS — BP 100/68 | HR 83 | Temp 98.4°F | Ht 63.0 in | Wt 133.6 lb

## 2020-12-03 DIAGNOSIS — Z794 Long term (current) use of insulin: Secondary | ICD-10-CM | POA: Diagnosis not present

## 2020-12-03 DIAGNOSIS — E1165 Type 2 diabetes mellitus with hyperglycemia: Secondary | ICD-10-CM

## 2020-12-03 DIAGNOSIS — E782 Mixed hyperlipidemia: Secondary | ICD-10-CM | POA: Diagnosis not present

## 2020-12-03 LAB — POCT GLYCOSYLATED HEMOGLOBIN (HGB A1C): Hemoglobin A1C: 8.2 % — AB (ref 4.0–5.6)

## 2020-12-03 LAB — MICROALBUMIN / CREATININE URINE RATIO
Creatinine,U: 199.7 mg/dL
Microalb Creat Ratio: 0.7 mg/g (ref 0.0–30.0)
Microalb, Ur: 1.5 mg/dL (ref 0.0–1.9)

## 2020-12-03 MED ORDER — MOMETASONE FUROATE 50 MCG/ACT NA SUSP
NASAL | 12 refills | Status: DC
Start: 2020-12-03 — End: 2022-01-10

## 2020-12-03 MED ORDER — METFORMIN HCL 1000 MG PO TABS: 1.0000 | ORAL_TABLET | Freq: Two times a day (BID) | ORAL | 2 refills | Status: DC

## 2020-12-03 NOTE — Patient Instructions (Signed)
Continue with metformin 1000mg  2x/day Continue with trulicity 3mg  once per week Increase lantus (insulin) to 22 units from 18

## 2020-12-03 NOTE — Progress Notes (Signed)
Chief Complaint  Patient presents with  . Diabetes    3 month follow up    HPI: *Alyssa Weaver is a 70 y.o. female here for DM, HLD follow-up. She is accompanied by her husband who helps translate when needed.  For DM pt is taking metformin 0722VJ BID, trulicity 40m weekly, and lantus 18 units daily in AM. At last OV her trulicity was increased from 1.572mto 2m81meekly. For HLD pt is taking lipitor 78m22mily  Pt does check BS at home. Readings: fasting this AM 139 Hypoglycemia/Hypergylcemic episodes: no  Diet: tries to limit carbs Exercise: walks daily   Lab Results  Component Value Date   HGBA1C 8.2 (A) 12/03/2020   Lab Results  Component Value Date   MICROALBUR neg 11/19/2019   Lab Results  Component Value Date   CREATININE 0.66 06/23/2020   Lab Results  Component Value Date   CHOL 106 05/03/2020   HDL 36 (L) 05/03/2020   LDLCALC 43 05/03/2020   TRIG 160 (H) 05/03/2020   CHOLHDL 2.9 05/03/2020   Lab Results  Component Value Date   ALT 24 06/23/2020   AST 24 06/23/2020   ALKPHOS 41 06/23/2020   BILITOT 0.6 06/23/2020    The ASCVD Risk score (GoffBrownsdalet al., 2013) failed to calculate for the following reasons:   The valid total cholesterol range is 130 to 320 mg/dL   Past Medical History:  Diagnosis Date  . Allergy   . Diabetes mellitus without complication (HCC)[place]. GERD (gastroesophageal reflux disease)   . Hyperlipidemia   . Hypertension   . Osteoporosis     Past Surgical History:  Procedure Laterality Date  . APPENDECTOMY  1978  . [place]    Social History   Socioeconomic History  . Marital status: Married    Spouse name: SoneMargorie JohnNumber of children: 5  . Years of education: 13  21Highest education level: Not on file  Occupational History  . Occupation: housewife  Tobacco Use  . Smoking status: Current Every Day Smoker    Packs/day: 0.25    Years: 43.00    Pack years: 10.75     Types: Cigarettes  . Smokeless tobacco: Never Used  . Tobacco comment: Husband smokes 1 ppd.  He might be willing to quit at same time.  Vaping Use  . Vaping Use: Never used  Substance and Sexual Activity  . Alcohol use: No  . Drug use: No  . Sexual activity: Yes    Birth control/protection: Post-menopausal  Other Topics Concern  . Not on file  Social History Narrative   [place]   [place]   [place] [facility] [place]   Social Determinants of Health   Financial Resource Strain: Not on file  Food Insecurity: Not on file  Transportation Needs: Not on file  Physical Activity: Not on file  Stress: Not on file  Social Connections: Not on file  Intimate Partner Violence: Not on file    Family History  Problem Relation Age of Onset  . Asthma Brother   . Thyroid disease Neg Hx   . Osteoporosis Neg Hx   . Colon cancer Neg Hx   . Colon polyps Neg Hx   . Stomach cancer Neg Hx   . Esophageal cancer  Neg Hx      Immunization History  Administered Date(s) Administered  . Fluad Quad(high Dose 65+) 05/03/2020  . Influenza, High Dose Seasonal PF 06/10/2018, 03/15/2019, 05/02/2020  . Influenza-Unspecified 06/30/2016, 04/19/2017, 03/16/2019  . PFIZER(Purple Top)SARS-COV-2 Vaccination 09/29/2019, 10/28/2019, 05/17/2020  . Pneumococcal Conjugate-13 12/07/2017  . Pneumococcal Polysaccharide-23 02/18/2019, 03/15/2019  . Zoster Recombinat (Shingrix) 01/07/2018, 04/02/2018    Outpatient Encounter Medications as of 12/03/2020  Medication Sig  . acetaminophen (TYLENOL 8 HOUR) 650 MG CR tablet Take 1 tablet (650 mg total) by mouth every 8 (eight) hours as needed for pain.  Marland Kitchen atorvastatin (LIPITOR) 80 MG tablet Take 1 tablet (80 mg total) by mouth daily.  . Blood Glucose Monitoring Suppl (AGAMATRIX PRESTO) w/Device KIT Twice daily sugar checks before meals  . Calcium Citrate-Vitamin D  (CITRACAL/VITAMIN D) 250-200 MG-UNIT TABS 2 tabs by mouth twice daily  . cetirizine (ZYRTEC) 10 MG tablet Take 1 tablet (10 mg total) by mouth daily.  . cyclobenzaprine (FLEXERIL) 5 MG tablet Take 1 tablet (5 mg total) by mouth 3 (three) times daily as needed for muscle spasms.  . Dulaglutide (TRULICITY) 3 GU/4.4IH SOPN Inject 3 mg as directed once a week.  . Ergocalciferol (VITAMIN D2) 50 MCG (2000 UT) TABS Take by mouth daily.  Marland Kitchen glucose blood (ONE TOUCH ULTRA TEST) test strip Use to check CBGs qam fasting, 2 hrs postprandial, & if having hyper or hypoglycemic sxs. Dx E11.65, Z79.4  . ibandronate (BONIVA) 150 MG tablet Take 1 tablet (150 mg total) by mouth every 30 (thirty) days. Take in the morning with a full glass of water, on an empty stomach, and do not take anything else by mouth or lie down for the next 30 min.  . insulin glargine (LANTUS SOLOSTAR) 100 UNIT/ML Solostar Pen Inject 18 Units into the skin every morning.  . latanoprost (XALATAN) 0.005 % ophthalmic solution INSTILL 1 DROP INTO EACH EYE ONCE DAILY IN THE EVENING  . Olopatadine HCl (PATADAY) 0.2 % SOLN Apply 1 drop to eye 2 (two) times daily.  . pantoprazole (PROTONIX) 20 MG tablet Take 1 tablet (20 mg total) by mouth daily as needed for heartburn.  . [DISCONTINUED] metFORMIN (GLUCOPHAGE) 1000 MG tablet TAKE 1 TABLET BY MOUTH TWICE DAILY WITH A MEAL  . [DISCONTINUED] mometasone (NASONEX) 50 MCG/ACT nasal spray 2 sprays each nostril daily  . metFORMIN (GLUCOPHAGE) 1000 MG tablet Take 1 tablet (1,000 mg total) by mouth 2 (two) times daily with a meal.  . mometasone (NASONEX) 50 MCG/ACT nasal spray 2 sprays each nostril daily  . propylthiouracil (PTU) 50 MG tablet Take 1 tablet (50 mg total) by mouth daily.   No facility-administered encounter medications on file as of 12/03/2020.     ROS: Gen: no fever, chills  Eyes: no blurry vision, double vision Resp: no cough, wheeze,SOB CV: no CP, palpitations, LE edema,  GI: no  heartburn, n/v/d/c, abd pain GU: no dysuria, urgency, frequency, hematuria  Neuro: no dizziness, headache, weakness   No Known Allergies  BP 100/68 (BP Location: Left Arm, Patient Position: Sitting, Cuff Size: Normal)   Pulse 83   Temp 98.4 F (36.9 C) (Oral)   Ht '5\' 3"'  (1.6 m)   Wt 133 lb 9.6 oz (60.6 kg)   SpO2 99%   BMI 23.67 kg/m   Wt Readings from Last 3 Encounters:  12/03/20 133 lb 9.6 oz (60.6 kg)  10/25/20 136 lb (61.7 kg)  09/03/20 136 lb 9.6 oz (62 kg)   Temp Readings from  Last 3 Encounters:  12/03/20 98.4 F (36.9 C) (Oral)  09/03/20 (!) 97.3 F (36.3 C) (Temporal)  08/05/20 97.9 F (36.6 C) (Temporal)   BP Readings from Last 3 Encounters:  12/03/20 100/68  10/25/20 120/68  09/03/20 110/66   Pulse Readings from Last 3 Encounters:  12/03/20 83  10/25/20 92  09/03/20 87     Physical Exam Constitutional:      General: She is not in acute distress.    Appearance: She is not ill-appearing.  Cardiovascular:     Rate and Rhythm: Normal rate and regular rhythm.     Pulses: Normal pulses.  Pulmonary:     Effort: Pulmonary effort is normal. No respiratory distress.     Breath sounds: Normal breath sounds.  Musculoskeletal:     Right lower leg: No edema.     Left lower leg: No edema.  Neurological:     Mental Status: She is alert and oriented to person, place, and time.  Psychiatric:        Mood and Affect: Mood normal.        Behavior: Behavior normal.      A/P:  1. Type 2 diabetes mellitus with hyperglycemia, with long-term current use of insulin (HCC) - POCT HgB A1C = 8.2 (same as in 08/2020) - Urine Microalbumin w/creat. Ratio Refill: - metFORMIN (GLUCOPHAGE) 1000 MG tablet; Take 1 tablet (1,000 mg total) by mouth 2 (two) times daily with a meal.  Dispense: 180 tablet; Refill: 2 - cont trulicity 45m weekly - increase lantus from 18u to 22u and cont to check BS. I'd like pt to check BID - fasting in AM and 1-2hrs after dinner - f/u in 3  mo  2. Mixed hyperlipidemia - on lipitor 821mdaily, cont - LDL in 04/2020 at goal    This visit occurred during the SARS-CoV-2 public health emergency.  Safety protocols were in place, including screening questions prior to the visit, additional usage of staff PPE, and extensive cleaning of exam room while observing appropriate contact time as indicated for disinfecting solutions.

## 2021-01-19 ENCOUNTER — Other Ambulatory Visit: Payer: Self-pay | Admitting: Emergency Medicine

## 2021-01-19 DIAGNOSIS — E782 Mixed hyperlipidemia: Secondary | ICD-10-CM

## 2021-01-21 DIAGNOSIS — E119 Type 2 diabetes mellitus without complications: Secondary | ICD-10-CM | POA: Diagnosis not present

## 2021-01-21 DIAGNOSIS — H31092 Other chorioretinal scars, left eye: Secondary | ICD-10-CM | POA: Diagnosis not present

## 2021-01-21 DIAGNOSIS — H401132 Primary open-angle glaucoma, bilateral, moderate stage: Secondary | ICD-10-CM | POA: Diagnosis not present

## 2021-01-21 DIAGNOSIS — H2513 Age-related nuclear cataract, bilateral: Secondary | ICD-10-CM | POA: Diagnosis not present

## 2021-01-25 ENCOUNTER — Ambulatory Visit (INDEPENDENT_AMBULATORY_CARE_PROVIDER_SITE_OTHER): Payer: Medicare Other | Admitting: Endocrinology

## 2021-01-25 ENCOUNTER — Encounter: Payer: Self-pay | Admitting: Endocrinology

## 2021-01-25 ENCOUNTER — Other Ambulatory Visit: Payer: Self-pay

## 2021-01-25 VITALS — BP 98/60 | HR 91 | Ht 62.0 in | Wt 135.4 lb

## 2021-01-25 DIAGNOSIS — M810 Age-related osteoporosis without current pathological fracture: Secondary | ICD-10-CM

## 2021-01-25 DIAGNOSIS — E0590 Thyrotoxicosis, unspecified without thyrotoxic crisis or storm: Secondary | ICD-10-CM | POA: Diagnosis not present

## 2021-01-25 LAB — T4, FREE: Free T4: 0.82 ng/dL (ref 0.60–1.60)

## 2021-01-25 LAB — TSH: TSH: 0.33 u[IU]/mL — ABNORMAL LOW (ref 0.35–4.50)

## 2021-01-25 NOTE — Progress Notes (Signed)
Subjective:    Patient ID: Alyssa Weaver, female    DOB: [birth year], 70 y.o.   MRN: [id number]  HPI husband translates.  Pt returns for f/u of hyperthyroidism( dx'ed 2019; she was rx'ed tapazole in 2020 (she declines RAI rx); US showed MNG; bxs of 2 left sided nodules in 2018 showed beth cat 2: [place] says no f/u is needed; 2 weeks after starting tapazole, she developed slight pain at the mid-abdomen, but no assoc n/v.  sxs resolved after stopping it, so she was changed to PTU).   She takes PTU as rx'ed.  Pt returns for f/u of osteoporosis: Dx'ed: 2020 Secondary cause: smoking and hyperthyroidism.   Fractures: none Past rx: alendronate for a few weeks in 2020 Current rx: Boniva since 2020, and Vit-D, 2000 units/d Last DEXA result (2020) worst T-score (spine) was -2.5 Other: none Interval hx: pt states she feels well in general.  Past Medical History:  Diagnosis Date   Allergy    Diabetes mellitus without complication (HCC)    GERD (gastroesophageal reflux disease)    Hyperlipidemia    Hypertension    Osteoporosis     Past Surgical History:  Procedure Laterality Date   APPENDECTOMY  1978   COLONOSCOPY     in [place]   Socioeconomic History   Marital status: Married    Spouse name: [name]   Number of children: 5   Years of education: 13   Highest education level: Not on file  Occupational History   Occupation: housewife  Tobacco Use   Smoking status: Every Day    Packs/day: 0.25    Years: 43.00    Pack years: 10.75    Types: Cigarettes   Smokeless tobacco: Never   Tobacco comments:    Husband smokes 1 ppd.  He might be willing to quit at same time.  Vaping Use   Vaping Use: Never used  Substance and Sexual Activity   Alcohol use: No   Drug use: No   Sexual activity: Yes    Birth control/protection: Post-menopausal  Other Topics Concern   Not on file  Social History Narrative   Originally from [place]   Refugees--lived in  [place] 2 years.   [place]ame to the [place] In [place].   Lived in the [place] until moved to [facility] Nov. 2016   Social Determinants of Health   Financial Resource Strain: Not on file  Food Insecurity: Not on file  Transportation Needs: Not on file  Physical Activity: Not on file  Stress: Not on file  Social Connections: Not on file  Intimate Partner Violence: Not on file    Current Outpatient Medications on File Prior to Visit  Medication Sig Dispense Refill   acetaminophen (TYLENOL 8 HOUR) 650 MG CR tablet Take 1 tablet (650 mg total) by mouth every 8 (eight) hours as needed for pain. 20 tablet 0   atorvastatin (LIPITOR) 80 MG tablet Take 1 tablet by mouth once daily 90 tablet 0   Blood Glucose Monitoring Suppl (AGAMATRIX PRESTO) w/Device KIT Twice daily sugar checks before meals 1 kit 0   Calcium Citrate-Vitamin D (CITRACAL/VITAMIN D) 250-200 MG-UNIT TABS 2 tabs by mouth twice daily 120 each    cetirizine (ZYRTEC) 10 MG tablet Take 1 tablet (10 mg total) by mouth daily. 30 tablet 11   cyclobenzaprine (FLEXERIL) 5 MG tablet Take 1 tablet (5 mg total) by mouth 3 (three) times daily as needed for muscle  spasms. 30 tablet 1   Dulaglutide (TRULICITY) 3 RW/4.1JS SOPN Inject 3 mg as directed once a week. 2 mL 5   Ergocalciferol (VITAMIN D2) 50 MCG (2000 UT) TABS Take by mouth daily.     glucose blood (ONE TOUCH ULTRA TEST) test strip Use to check CBGs qam fasting, 2 hrs postprandial, & if having hyper or hypoglycemic sxs. Dx E11.65, Z79.4 400 each 4   ibandronate (BONIVA) 150 MG tablet Take 1 tablet (150 mg total) by mouth every 30 (thirty) days. Take in the morning with a full glass of water, on an empty stomach, and do not take anything else by mouth or lie down for the next 30 min. 3 tablet 3   insulin glargine (LANTUS SOLOSTAR) 100 UNIT/ML Solostar Pen Inject 18 Units into the skin every morning.     latanoprost (XALATAN) 0.005 % ophthalmic solution INSTILL 1 DROP INTO EACH EYE ONCE DAILY IN THE  EVENING     metFORMIN (GLUCOPHAGE) 1000 MG tablet Take 1 tablet (1,000 mg total) by mouth 2 (two) times daily with a meal. 180 tablet 2   mometasone (NASONEX) 50 MCG/ACT nasal spray 2 sprays each nostril daily 17 g 12   Olopatadine HCl (PATADAY) 0.2 % SOLN Apply 1 drop to eye 2 (two) times daily. 2.5 mL 2   pantoprazole (PROTONIX) 20 MG tablet Take 1 tablet (20 mg total) by mouth daily as needed for heartburn. 30 tablet 3   propylthiouracil (PTU) 50 MG tablet Take 1 tablet (50 mg total) by mouth daily. 90 tablet 3   No current facility-administered medications on file prior to visit.    No Known Allergies  Family History  Problem Relation Age of Onset   Asthma Brother    Thyroid disease Neg Hx    Osteoporosis Neg Hx    Colon cancer Neg Hx    Colon polyps Neg Hx    Stomach cancer Neg Hx    Esophageal cancer Neg Hx     BP 98/60   Pulse 91   Ht _0  (1.575 m)   Wt 135 lb 6.4 oz (61.4 kg)   SpO2 97%   BMI 24.76 kg/m    Review of Systems     Objective:   Physical Exam NECK: thyroid is slightly and diffusely enlarged.  Lab Results  Component Value Date   TSH 0.33 (L) 01/25/2021       Assessment & Plan:  Hyperthyroidism: uncontrolled.  Please continue the same medication, as there will prob be further improvement with time.  Osteoporosis: recheck DEXA.

## 2021-01-25 NOTE — Patient Instructions (Addendum)
Blood tests are requested for you today.  We'll let you know about the results.  If ever you have fever while taking propylthiouracil, stop it and call us, even if the reason is obvious, because of the risk of a rare side-effect.  It is best to never miss the propylthiouracil.  However, if you do miss it, next best is to double up the next time.   Let's recheck the bone density x-ray.  you will receive a phone call, about a day and time for an appointment.  Please come back for a follow-up appointment in 4 months.     ????????????????????????????????????. ????????????????????????????????????????. ??????????????????????????????? propylthiouracil, ???????????????????????, ???????????????????????, ???????????????????????????????????????????. ???????????????????????? propylthiouracil. ???????????, ?????????????, ???????????????????????????????????????????????????. ????? X-ray ??????????????????????????. ???????????????????, ????????????????????????????????????. ?????????????????????????????????????? 4 ?????.

## 2021-02-02 ENCOUNTER — Other Ambulatory Visit: Payer: Self-pay

## 2021-02-02 DIAGNOSIS — E782 Mixed hyperlipidemia: Secondary | ICD-10-CM

## 2021-02-02 MED ORDER — ATORVASTATIN CALCIUM 80 MG PO TABS: 80.0000 mg | ORAL_TABLET | Freq: Every day | ORAL | 1 refills | Status: DC

## 2021-02-02 NOTE — Telephone Encounter (Signed)
Made in error

## 2021-02-02 NOTE — Telephone Encounter (Signed)
Last fill 01/19/21 provider Sagard[place] Last OV 12/03/20

## 2021-02-20 ENCOUNTER — Other Ambulatory Visit: Payer: Self-pay | Admitting: Family Medicine

## 2021-02-20 DIAGNOSIS — E1165 Type 2 diabetes mellitus with hyperglycemia: Secondary | ICD-10-CM

## 2021-03-09 ENCOUNTER — Ambulatory Visit: Payer: Medicare Other | Admitting: Family Medicine

## 2021-03-14 ENCOUNTER — Other Ambulatory Visit: Payer: Self-pay

## 2021-03-14 ENCOUNTER — Ambulatory Visit (INDEPENDENT_AMBULATORY_CARE_PROVIDER_SITE_OTHER): Payer: Medicare Other | Admitting: Family Medicine

## 2021-03-14 ENCOUNTER — Encounter: Payer: Self-pay | Admitting: Family Medicine

## 2021-03-14 VITALS — BP 114/68 | HR 85 | Temp 96.9°F | Ht 62.0 in | Wt 137.2 lb

## 2021-03-14 DIAGNOSIS — E1165 Type 2 diabetes mellitus with hyperglycemia: Secondary | ICD-10-CM

## 2021-03-14 DIAGNOSIS — H401132 Primary open-angle glaucoma, bilateral, moderate stage: Secondary | ICD-10-CM | POA: Insufficient documentation

## 2021-03-14 DIAGNOSIS — Z1231 Encounter for screening mammogram for malignant neoplasm of breast: Secondary | ICD-10-CM

## 2021-03-14 DIAGNOSIS — M810 Age-related osteoporosis without current pathological fracture: Secondary | ICD-10-CM

## 2021-03-14 DIAGNOSIS — H409 Unspecified glaucoma: Secondary | ICD-10-CM | POA: Insufficient documentation

## 2021-03-14 DIAGNOSIS — E0590 Thyrotoxicosis, unspecified without thyrotoxic crisis or storm: Secondary | ICD-10-CM

## 2021-03-14 DIAGNOSIS — Z794 Long term (current) use of insulin: Secondary | ICD-10-CM

## 2021-03-14 DIAGNOSIS — E782 Mixed hyperlipidemia: Secondary | ICD-10-CM | POA: Diagnosis not present

## 2021-03-14 DIAGNOSIS — Z716 Tobacco abuse counseling: Secondary | ICD-10-CM

## 2021-03-14 LAB — LIPID PANEL
Cholesterol: 86 mg/dL (ref 0–200)
HDL: 35.4 mg/dL — ABNORMAL LOW (ref >39.00)
LDL Cholesterol: 28 mg/dL (ref 0–99)
NonHDL: 50.71
Total CHOL/HDL Ratio: 2
Triglycerides: 112 mg/dL (ref 0.0–149.0)
VLDL: 22.4 mg/dL (ref 0.0–40.0)

## 2021-03-14 LAB — VITAMIN D 25 HYDROXY (VIT D DEFICIENCY, FRACTURES): VITD: 73.85 ng/mL (ref 30.00–100.00)

## 2021-03-14 LAB — HEMOGLOBIN A1C: Hgb A1c MFr Bld: 8.2 % — ABNORMAL HIGH (ref 4.6–6.5)

## 2021-03-14 LAB — GLUCOSE, RANDOM: Glucose, Bld: 129 mg/dL — ABNORMAL HIGH (ref 70–99)

## 2021-03-14 MED ORDER — LANTUS SOLOSTAR 100 UNIT/ML ~~LOC~~ SOPN: 22.0000 [IU] | PEN_INJECTOR | SUBCUTANEOUS | 6 refills | Status: DC

## 2021-03-14 MED ORDER — TRULICITY 3 MG/0.5ML ~~LOC~~ SOAJ: SUBCUTANEOUS | 11 refills | Status: DC

## 2021-03-14 MED ORDER — METFORMIN HCL 1000 MG PO TABS: 1000.0000 mg | ORAL_TABLET | Freq: Two times a day (BID) | ORAL | 3 refills | Status: DC

## 2021-03-14 MED ORDER — ATORVASTATIN CALCIUM 80 MG PO TABS: 80.0000 mg | ORAL_TABLET | Freq: Every day | ORAL | 3 refills | Status: DC

## 2021-03-14 NOTE — Progress Notes (Signed)
[facility] [address] [place] [address] Dept: [phone] Dept Fax: [phone]  Transfer of Care Office Visit  Subjective:    Patient ID: Alyssa Weaver, female    DOB: Dec 15, 1950, 70 y.o..   MRN: [id number]  Chief Complaint  Patient presents with   Follow-up    3 mo f/u DM. Bs ranges 66-120    History of Present Illness:  Patient is in today to establish care. Alyssa Weaver is originally from [place]. She is accompanied by a medical interpreter ([place]). Alyssa Weaver immigrated to the [place] in 1886. She lived in [place] until about 2010. She is married. They have 5 children (age range 44-64 years old). She is retired from working in an Magazine features editor. She smokes about 1/3 ppd of cigarettes. She has tried to quit int he past. She notes her husband smokes, which makes it difficult. She denies alcohol or drug use.  Alyssa Weaver has a history of seasonal allergies. She uses cetirizine, Nasonex spray, and Pataday eye drops as needed for symptom management. She notes some mild itchy eyes currently.  Alyssa Weaver has a history of Type 2 diabetes. She is managed on Lantus 22 units daily, metformin 1000 mg daily, and Trulicity injections weekly. She sees Dr. Loanne Drilling (endocrinology). She also has a history of hyperlipidemia and is managed on atorvastatin.  Additionally, Alyssa Weaver has a history of hyperthyroidism. She is managed on PTU.  Alyssa Weaver has a history of osteopenia. She is currently managed on Boniva and a calcium + Vit D supplement.  Past Medical History: Patient Active Problem List   Diagnosis Date Noted   Glaucoma 03/14/2021   Gastroesophageal reflux disease 07/17/2019   Age-related osteoporosis without current pathological fracture 05/20/2019   Hyperthyroidism 05/20/2019   Tobacco use disorder 01/07/2018   Seasonal allergic rhinitis due to pollen 01/07/2018   Environmental allergies    Type 2 diabetes  mellitus with hyperglycemia, with long-term current use of insulin ([place]) 01/04/2016   Hyperlipidemia 01/04/2016   Past Surgical History:  Procedure Laterality Date   APPENDECTOMY  1978   COLONOSCOPY     in [place]   Family History  Problem Relation Age of Onset   Asthma Brother    Thyroid disease Neg Hx    Osteoporosis Neg Hx    Colon cancer Neg Hx    Colon polyps Neg Hx    Stomach cancer Neg Hx    Esophageal cancer Neg Hx    Outpatient Medications Prior to Visit  Medication Sig Dispense Refill   acetaminophen (TYLENOL 8 HOUR) 650 MG CR tablet Take 1 tablet (650 mg total) by mouth every 8 (eight) hours as needed for pain. 20 tablet 0   atorvastatin (LIPITOR) 80 MG tablet Take 1 tablet (80 mg total) by mouth daily. 90 tablet 1   Blood Glucose Monitoring Suppl (AGAMATRIX PRESTO) w/Device KIT Twice daily sugar checks before meals 1 kit 0   Calcium Citrate-Vitamin D (CITRACAL/VITAMIN D) 250-200 MG-UNIT TABS 2 tabs by mouth twice daily 120 each    cetirizine (ZYRTEC) 10 MG tablet Take 1 tablet (10 mg total) by mouth daily. 30 tablet 11   cyclobenzaprine (FLEXERIL) 5 MG tablet Take 1 tablet (5 mg total) by mouth 3 (three) times daily as needed for muscle spasms. 30 tablet 1   Ergocalciferol (VITAMIN D2) 50 MCG (2000 UT) TABS Take by mouth daily.     glucose blood (ONE TOUCH ULTRA TEST) test  strip Use to check CBGs qam fasting, 2 hrs postprandial, & if having hyper or hypoglycemic sxs. Dx E11.65, Z79.4 400 each 4   ibandronate (BONIVA) 150 MG tablet Take 1 tablet (150 mg total) by mouth every 30 (thirty) days. Take in the morning with a full glass of water, on an empty stomach, and do not take anything else by mouth or lie down for the next 30 min. 3 tablet 3   insulin glargine (LANTUS SOLOSTAR) 100 UNIT/ML Solostar Pen Inject 18 Units into the skin every morning.     latanoprost (XALATAN) 0.005 % ophthalmic solution INSTILL 1 DROP INTO EACH EYE ONCE DAILY IN THE EVENING      metFORMIN (GLUCOPHAGE) 1000 MG tablet Take 1 tablet (1,000 mg total) by mouth 2 (two) times daily with a meal. 180 tablet 2   mometasone (NASONEX) 50 MCG/ACT nasal spray 2 sprays each nostril daily 17 g 12   Olopatadine HCl (PATADAY) 0.2 % SOLN Apply 1 drop to eye 2 (two) times daily. 2.5 mL 2   pantoprazole (PROTONIX) 20 MG tablet Take 1 tablet (20 mg total) by mouth daily as needed for heartburn. 30 tablet 3   propylthiouracil (PTU) 50 MG tablet Take 1 tablet (50 mg total) by mouth daily. 90 tablet 3   TRULICITY 3 YE/2.3VK SOPN INJECT 3 MG (0.5 ML) AS DIRECTED ONCE A WEEK 4 mL 0   No facility-administered medications prior to visit.   No Known Allergies    Objective:   Today's Vitals   03/14/21 0904  BP: 114/68  Pulse: 85  Temp: (!) 96.9 F (36.1 C)  TempSrc: Temporal  SpO2: 99%  Weight: 137 lb 3.2 oz (62.2 kg)  Height: _0  (1.575 m)   Body mass index is 25.09 kg/m.   General: Well developed, well nourished. No acute distress. Psych: Alert and oriented. Normal mood and affect.  Health Maintenance Due  Topic Date Due   MAMMOGRAM  10/11/2019   OPHTHALMOLOGY EXAM  02/25/2020   COVID-19 Vaccine (4 - Booster for Pfizer series) 09/17/2020   INFLUENZA VACCINE  02/28/2021     Lab results: Lab Results  Component Value Date   HGBA1C 8.2 (A) 12/03/2020   Lab Results  Component Value Date   TSH 0.33 (L) 01/25/2021   Lab Results  Component Value Date   CHOL 106 05/03/2020   HDL 36 (L) 05/03/2020   LDLCALC 43 05/03/2020   TRIG 160 (H) 05/03/2020   CHOLHDL 2.9 05/03/2020   Imaging: Bone Density (05/31/2019) ASSESSMENT: The BMD measured at AP Spine L1-L4 is 0.882 g/cm2 with a T-score of -2.5. This patient is considered osteoporotic according to [facility]) criteria.   The scan quality is good. Site Region Measured Date Measured Age YA BMD Significant CHANGE T-score AP Spine  L1-L4      05/13/2019    66.9         -2.5    0.882 g/cm2    DualFemur Neck Left  05/13/2019    66.9         -1.5    0.828 g/cm2   DualFemur Total Mean 05/13/2019    66.9         -0.4    0.961 g/cm2  Assessment & Plan:  1. Type 2 diabetes mellitus with hyperglycemia, with long-term current use of insulin [facility]) Ms. Brunsman is due for labs today. I will continue her current medication regimen.  - Glucose, random - Hemoglobin A1c - metFORMIN (GLUCOPHAGE) 1000  MG tablet; Take 1 tablet (1,000 mg total) by mouth 2 (two) times daily with a meal.  Dispense: 180 tablet; Refill: 3 - insulin glargine (LANTUS SOLOSTAR) 100 UNIT/ML Solostar Pen; Inject 22 Units into the skin every morning.  Dispense: 15 mL; Refill: 6 - Dulaglutide (TRULICITY) 3 CH/8.8FO SOPN; INJECT 3 MG (0.5 ML) AS DIRECTED ONCE A WEEK  Dispense: 4 mL; Refill: 11  2. Hyperthyroidism Ms. Diss is managed on propylthiouracil by Dr. Loanne Drilling. Although her last TSH was mildly low, he anticipates this will gradually improve over time.  3. Age-related osteoporosis without current pathological fracture Continue on Vitamin D and Boniva. I will reassess her Vitamin D level, as she is managed on both an OTC Vitamin D supplement and a combination  pill with Vit D+Calcium.  - VITAMIN D 25 Hydroxy (Vit-D Deficiency, Fractures)  4. Mixed hyperlipidemia Due to reassess lipids. Continue Lipitor.  - Lipid panel - atorvastatin (LIPITOR) 80 MG tablet; Take 1 tablet (80 mg total) by mouth daily.  Dispense: 90 tablet; Refill: 3  5. Encounter for screening mammogram for malignant neoplasm of breast  - MM DIGITAL SCREENING BILATERAL; Future  6. Encounter for smoking cessation counseling I advised Ms. Wheat to stop smoking. I acknowledged that having a husband who smokes can make stopping difficult. I encouraged her to continue her efforts.  Haydee Salter, MD

## 2021-03-18 LAB — HM MAMMOGRAPHY

## 2021-03-24 ENCOUNTER — Encounter: Payer: Self-pay | Admitting: Family Medicine

## 2021-03-31 ENCOUNTER — Other Ambulatory Visit: Payer: Self-pay | Admitting: Family Medicine

## 2021-03-31 MED ORDER — PANTOPRAZOLE SODIUM 20 MG PO TBEC: 20.0000 mg | DELAYED_RELEASE_TABLET | Freq: Every day | ORAL | 3 refills | Status: DC | PRN

## 2021-03-31 NOTE — Telephone Encounter (Signed)
[facility] called to request refill for Pantoprazole 20 mg.   [place], [place]

## 2021-03-31 NOTE — Telephone Encounter (Signed)
Refill request for:  Pantoprazole 20 mg.  LR 07/17/19, #30, 3 rf (Dr Sloan Leiter) [place]  03/14/21 FOV 06/20/21  Please review and advise.  Thanks.  Dm/cma

## 2021-04-20 ENCOUNTER — Other Ambulatory Visit: Payer: Self-pay | Admitting: Registered Nurse

## 2021-04-20 DIAGNOSIS — J302 Other seasonal allergic rhinitis: Secondary | ICD-10-CM

## 2021-05-23 ENCOUNTER — Ambulatory Visit (INDEPENDENT_AMBULATORY_CARE_PROVIDER_SITE_OTHER): Payer: Medicare Other | Admitting: Endocrinology

## 2021-05-23 ENCOUNTER — Other Ambulatory Visit: Payer: Self-pay

## 2021-05-23 VITALS — BP 110/58 | HR 95 | Ht 62.0 in | Wt 135.6 lb

## 2021-05-23 DIAGNOSIS — M810 Age-related osteoporosis without current pathological fracture: Secondary | ICD-10-CM

## 2021-05-23 DIAGNOSIS — E0590 Thyrotoxicosis, unspecified without thyrotoxic crisis or storm: Secondary | ICD-10-CM | POA: Diagnosis not present

## 2021-05-23 LAB — T4, FREE: Free T4: 0.82 ng/dL (ref 0.60–1.60)

## 2021-05-23 LAB — TSH: TSH: 0.32 u[IU]/mL — ABNORMAL LOW (ref 0.35–5.50)

## 2021-05-23 MED ORDER — PROPYLTHIOURACIL 50 MG PO TABS: 100.0000 mg | ORAL_TABLET | Freq: Every day | ORAL | 3 refills | Status: DC

## 2021-05-23 NOTE — Progress Notes (Signed)
Subjective:    Patient ID: Alyssa Weaver, female    DOB: 1950-09-03, 70 y.o.   MRN: [id number]  HPI husband translates.  Pt returns for f/u of hyperthyroidism (dx'ed 2019; she was rx'ed tapazole in 2020 (she declines RAI rx); US showed MNG; bxs of 2 left sided nodules in 2018 showed beth cat 2: [place] says no f/u is needed; 2 weeks after starting tapazole, she developed slight pain at the mid-abdomen, but no assoc n/v.  sxs resolved after stopping it, so she was changed to PTU).   She takes PTU as rx'ed.  Pt returns for f/u of osteoporosis:  Dx'ed: 2020 Secondary cause: smoking and hyperthyroidism.   Fractures: none Past rx: alendronate for a few weeks in 2020 Current rx: Boniva since 2020, and Vit-D, 2000 units/d.   Last DEXA result (2020) worst T-score (spine) was -2.5 Other: none.   Interval hx: pt states she feels well in general.   Past Medical History:  Diagnosis Date   Allergy    Diabetes mellitus without complication (HCC)    GERD (gastroesophageal reflux disease)    Hyperlipidemia    Hypertension    Osteoporosis     Past Surgical History:  Procedure Laterality Date   APPENDECTOMY  1978   COLONOSCOPY     in [place]    Social History   Socioeconomic History   Marital status: Married    Spouse name: [name]   Number of children: 5   Years of education: 13   Highest education level: Not on file  Occupational History   Occupation: housewife  Tobacco Use   Smoking status: Every Day    Packs/day: 0.25    Years: 43.00    Pack years: 10.75    Types: Cigarettes   Smokeless tobacco: Never   Tobacco comments:    Husband smokes 1 ppd.  He might be willing to quit at same time.  Vaping Use   Vaping Use: Never used  Substance and Sexual Activity   Alcohol use: No   Drug use: No   Sexual activity: Yes    Birth control/protection: Post-menopausal  Other Topics Concern   Not on file  Social History Narrative   Originally from [place]    Refugees--lived in [place] 2 years.   [place] the [place].   Lived in the [place] until moved to [facility] Nov. 2016   Social Determinants of Health   Financial Resource Strain: Not on file  Food Insecurity: Not on file  Transportation Needs: Not on file  Physical Activity: Not on file  Stress: Not on file  Social Connections: Not on file  Intimate Partner Violence: Not on file    Current Outpatient Medications on File Prior to Visit  Medication Sig Dispense Refill   acetaminophen (TYLENOL 8 HOUR) 650 MG CR tablet Take 1 tablet (650 mg total) by mouth every 8 (eight) hours as needed for pain. 20 tablet 0   atorvastatin (LIPITOR) 80 MG tablet Take 1 tablet (80 mg total) by mouth daily. 90 tablet 3   Blood Glucose Monitoring Suppl (AGAMATRIX PRESTO) w/Device KIT Twice daily sugar checks before meals 1 kit 0   Calcium Citrate-Vitamin D (CITRACAL/VITAMIN D) 250-200 MG-UNIT TABS 2 tabs by mouth twice daily 120 each    cetirizine (ZYRTEC) 10 MG tablet Take 1 tablet by mouth once daily 30 tablet 0   cyclobenzaprine (FLEXERIL) 5 MG tablet Take 1 tablet (5 mg total) by mouth 3 (three)  times daily as needed for muscle spasms. 30 tablet 1   Dulaglutide (TRULICITY) 3 JM/4.2AS SOPN INJECT 3 MG (0.5 ML) AS DIRECTED ONCE A WEEK 4 mL 11   Ergocalciferol (VITAMIN D2) 50 MCG (2000 UT) TABS Take by mouth daily.     glucose blood (ONE TOUCH ULTRA TEST) test strip Use to check CBGs qam fasting, 2 hrs postprandial, & if having hyper or hypoglycemic sxs. Dx E11.65, Z79.4 400 each 4   ibandronate (BONIVA) 150 MG tablet Take 1 tablet (150 mg total) by mouth every 30 (thirty) days. Take in the morning with a full glass of water, on an empty stomach, and do not take anything else by mouth or lie down for the next 30 min. 3 tablet 3   insulin glargine (LANTUS SOLOSTAR) 100 UNIT/ML Solostar Pen Inject 22 Units into the skin every morning. 15 mL 6   latanoprost (XALATAN) 0.005 % ophthalmic solution INSTILL 1 DROP  INTO EACH EYE ONCE DAILY IN THE EVENING     metFORMIN (GLUCOPHAGE) 1000 MG tablet Take 1 tablet (1,000 mg total) by mouth 2 (two) times daily with a meal. 180 tablet 3   mometasone (NASONEX) 50 MCG/ACT nasal spray 2 sprays each nostril daily 17 g 12   Olopatadine HCl (PATADAY) 0.2 % SOLN Apply 1 drop to eye 2 (two) times daily. 2.5 mL 2   pantoprazole (PROTONIX) 20 MG tablet Take 1 tablet (20 mg total) by mouth daily as needed for heartburn. 90 tablet 3   No current facility-administered medications on file prior to visit.    No Known Allergies  Family History  Problem Relation Age of Onset   Asthma Brother    Thyroid disease Neg Hx    Osteoporosis Neg Hx    Colon cancer Neg Hx    Colon polyps Neg Hx    Stomach cancer Neg Hx    Esophageal cancer Neg Hx     BP (!) 110/58 (BP Location: Right Arm, Patient Position: Sitting, Cuff Size: Normal)   Pulse 95   Ht 5' 2" (1.575 m)   Wt 135 lb 9.6 oz (61.5 kg)   SpO2 98%   BMI 24.80 kg/m    Review of Systems Denies fever, heartburn, and falls.      Objective:   Physical Exam VITAL SIGNS:  See vs page.   GENERAL: no distress.   NECK: thyroid is slightly and diffusely enlarged.    Lab Results  Component Value Date   TSH 0.32 (L) 05/23/2021      Assessment & Plan:  Hyperthyroidism: uncontrolled.  Increase PTU.  Osteoporosis: due for recheck.  Patient Instructions  Blood tests are requested for you today.  We'll let you know about the results.  If ever you have fever while taking propylthiouracil, stop it and call us, even if the reason is obvious, because of the risk of a rare side-effect.    It is best to never miss the propylthiouracil.  However, if you do miss it, next best is to double up the next time.   Let's recheck the bone density x-ray.  Please call [phone], to make an appointment.   Please come back for a follow-up appointment in 4 months.     ????????????????????????????????????.  ???????????????????????????????????????. ??????????????????????????????? propylthiouracil, ???????????????????????, ???????????????????????, ???????????????????????????????????????????. ???????????????????????? propylthiouracil. ???????????, ?????????????, ???????????????????????????????????????????????. ????? X-ray ??????????????????????????. ?????????? [phone] [id number], ???????????. ?????????????????????????????????????? 4 ?????.

## 2021-05-23 NOTE — Patient Instructions (Addendum)
Blood tests are requested for you today.  We'll let you know about the results.  If ever you have fever while taking propylthiouracil, stop it and call us, even if the reason is obvious, because of the risk of a rare side-effect.    It is best to never miss the propylthiouracil.  However, if you do miss it, next best is to double up the next time.   Let's recheck the bone density x-ray.  Please call [phone], to make an appointment.   Please come back for a follow-up appointment in 4 months.     ????????????????????????????????????. ???????????????????????????????????????. ??????????????????????????????? propylthiouracil, ???????????????????????, ???????????????????????, ???????????????????????????????????????????. ???????????????????????? propylthiouracil. ???????????, ?????????????, ???????????????????????????????????????????????. ????? X-ray ??????????????????????????. ?????????? [phone] [id number], ???????????. ?????????????????????????????????????? 4 ?????.

## 2021-06-20 ENCOUNTER — Encounter: Payer: Self-pay | Admitting: Family Medicine

## 2021-06-20 ENCOUNTER — Ambulatory Visit (INDEPENDENT_AMBULATORY_CARE_PROVIDER_SITE_OTHER): Payer: Medicare Other | Admitting: Family Medicine

## 2021-06-20 ENCOUNTER — Other Ambulatory Visit: Payer: Self-pay

## 2021-06-20 VITALS — BP 114/68 | HR 93 | Temp 97.8°F | Ht 62.0 in | Wt 137.6 lb

## 2021-06-20 DIAGNOSIS — E1165 Type 2 diabetes mellitus with hyperglycemia: Secondary | ICD-10-CM | POA: Diagnosis not present

## 2021-06-20 DIAGNOSIS — E0590 Thyrotoxicosis, unspecified without thyrotoxic crisis or storm: Secondary | ICD-10-CM | POA: Diagnosis not present

## 2021-06-20 DIAGNOSIS — F17200 Nicotine dependence, unspecified, uncomplicated: Secondary | ICD-10-CM

## 2021-06-20 DIAGNOSIS — Z794 Long term (current) use of insulin: Secondary | ICD-10-CM | POA: Diagnosis not present

## 2021-06-20 LAB — GLUCOSE, RANDOM: Glucose, Bld: 247 mg/dL — ABNORMAL HIGH (ref 70–99)

## 2021-06-20 LAB — HEMOGLOBIN A1C: Hgb A1c MFr Bld: 7.9 % — ABNORMAL HIGH (ref 4.6–6.5)

## 2021-06-20 NOTE — Progress Notes (Signed)
[facility] [address] [place] [address] Dept: [phone] Dept Fax: [phone]  Chronic Care Office Visit  Subjective:    Patient ID: Alyssa Weaver, female    DOB: 1951-06-07, 70 y.o..   MRN: [id number]  Chief Complaint  Patient presents with   Follow-up    3 month f/u.  No concerns.        History of Present Illness:  Patient is in today for reassessment of chronic medical issues. She is accompanied by a medical interpreter (Alyssa Weaver).   Alyssa Weaver smokes about 1/3 ppd of cigarettes. She has tried to quit in the past. Her husband recently stoppedf smoking with the assistance of [employer]. She apparently told him that ig he were able to stop for 3 months, she would also try and stop. She would like to try and do this on her own.    Alyssa Weaver has a history of Type 2 diabetes. She is managed on Lantus 22 units daily, metformin 1000 mg daily, and Trulicity injections weekly. She sees Alyssa Weaver (endocrinology). She also has a history of hyperlipidemia and is managed on atorvastatin. Additionally, Alyssa Weaver has a history of hyperthyroidism. She is managed on PTU. Her last TSH was mildly low. Alyssa Weaver asked her to double her PTU dose.   Past Medical History: Patient Active Problem List   Diagnosis Date Noted   Glaucoma 03/14/2021   Gastroesophageal reflux disease 07/17/2019   Osteoporosis 05/20/2019   Hyperthyroidism 05/20/2019   Tobacco use disorder 01/07/2018   Seasonal allergic rhinitis due to pollen 01/07/2018   Environmental allergies    Type 2 diabetes mellitus with hyperglycemia, with long-term current use of insulin ([place]) 01/04/2016   Hyperlipidemia 01/04/2016   Past Surgical History:  Procedure Laterality Date   APPENDECTOMY  1978   COLONOSCOPY     in [place]   Family History  Problem Relation Age of Onset   Asthma Brother    Thyroid disease Neg Hx    Osteoporosis Neg  Hx    Colon cancer Neg Hx    Colon polyps Neg Hx    Stomach cancer Neg Hx    Esophageal cancer Neg Hx    Outpatient Medications Prior to Visit  Medication Sig Dispense Refill   acetaminophen (TYLENOL 8 HOUR) 650 MG CR tablet Take 1 tablet (650 mg total) by mouth every 8 (eight) hours as needed for pain. 20 tablet 0   atorvastatin (LIPITOR) 80 MG tablet Take 1 tablet (80 mg total) by mouth daily. 90 tablet 3   Blood Glucose Monitoring Suppl (AGAMATRIX PRESTO) w/Device KIT Twice daily sugar checks before meals 1 kit 0   Calcium Citrate-Vitamin D (CITRACAL/VITAMIN D) 250-200 MG-UNIT TABS 2 tabs by mouth twice daily 120 each    cetirizine (ZYRTEC) 10 MG tablet Take 1 tablet by mouth once daily 30 tablet 0   cyclobenzaprine (FLEXERIL) 5 MG tablet Take 1 tablet (5 mg total) by mouth 3 (three) times daily as needed for muscle spasms. 30 tablet 1   Dulaglutide (TRULICITY) 3 LJ/4.4BE SOPN INJECT 3 MG (0.5 ML) AS DIRECTED ONCE A WEEK 4 mL 11   Ergocalciferol (VITAMIN D2) 50 MCG (2000 UT) TABS Take by mouth daily.     glucose blood (ONE TOUCH ULTRA TEST) test strip Use to check CBGs qam fasting, 2 hrs postprandial, & if having hyper or hypoglycemic sxs. Dx E11.65, Z79.4 400 each 4   ibandronate (BONIVA) 150 MG  tablet Take 1 tablet (150 mg total) by mouth every 30 (thirty) days. Take in the morning with a full glass of water, on an empty stomach, and do not take anything else by mouth or lie down for the next 30 min. 3 tablet 3   insulin glargine (LANTUS SOLOSTAR) 100 UNIT/ML Solostar Pen Inject 22 Units into the skin every morning. 15 mL 6   latanoprost (XALATAN) 0.005 % ophthalmic solution INSTILL 1 DROP INTO EACH EYE ONCE DAILY IN THE EVENING     metFORMIN (GLUCOPHAGE) 1000 MG tablet Take 1 tablet (1,000 mg total) by mouth 2 (two) times daily with a meal. 180 tablet 3   mometasone (NASONEX) 50 MCG/ACT nasal spray 2 sprays each nostril daily 17 g 12   Olopatadine HCl (PATADAY) 0.2 % SOLN Apply 1 drop to  eye 2 (two) times daily. 2.5 mL 2   pantoprazole (PROTONIX) 20 MG tablet Take 1 tablet (20 mg total) by mouth daily as needed for heartburn. 90 tablet 3   propylthiouracil (PTU) 50 MG tablet Take 2 tablets (100 mg total) by mouth daily. 180 tablet 3   No facility-administered medications prior to visit.    No Known Allergies    Objective:   Today's Vitals   06/20/21 1300  BP: 114/68  Pulse: 93  Temp: 97.8 F (36.6 C)  TempSrc: Temporal  SpO2: 96%  Weight: 137 lb 9.6 oz (62.4 kg)  Height: '5\' 2"'  (1.575 m)   Body mass index is 25.17 kg/m.   General: Well developed, well nourished. No acute distress. Feet: Skin intact, though mildly dry. DP and PT pulses 2+. 5.07 monofilament testing normal. Psych: Alert and oriented. Normal mood and affect.  Health Maintenance Due  Topic Date Due   TETANUS/TDAP  Never done   COVID-19 Vaccine (4 - Booster for Pfizer series) 07/12/2020   Lab Results Lab Results  Component Value Date   HGBA1C 8.2 (H) 03/14/2021   Lab Results  Component Value Date   TSH 0.32 (L) 05/23/2021   Lab Results  Component Value Date   CHOL 86 03/14/2021   HDL 35.40 (L) 03/14/2021   LDLCALC 28 03/14/2021   TRIG 112.0 03/14/2021   CHOLHDL 2 03/14/2021     Assessment & Plan:   1. Type 2 diabetes mellitus with hyperglycemia, with long-term current use of insulin ([place]) Reviewed home glucoses. Alyssa Weaver checks these three times a day. Recent blood sugars consistently under 120.  - Glucose, random - Hemoglobin A1c  2. Hyperthyroidism Reviewed endocrinology consult notes. On PTU. Followed by Alyssa Weaver.  3. Tobacco use disorder I encouraged Alyssa Weaver to stop tobacco use. I pointed out the success of her husband. I offered to prescribe medication to help, but she prefers to try on her own at this point.  Alyssa Salter, MD

## 2021-09-09 ENCOUNTER — Telehealth: Payer: Self-pay | Admitting: Registered Nurse

## 2021-09-09 DIAGNOSIS — J302 Other seasonal allergic rhinitis: Secondary | ICD-10-CM

## 2021-09-13 MED ORDER — CETIRIZINE HCL 10 MG PO TABS: 10.0000 mg | ORAL_TABLET | Freq: Every day | ORAL | 3 refills | Status: DC

## 2021-09-13 NOTE — Addendum Note (Signed)
Addended by: Konrad Saha on: 09/13/2021 11:29 AM   Modules accepted: Orders

## 2021-09-13 NOTE — Telephone Encounter (Signed)
Pt called to say she needed a refill, it was sent to her old provider. Please advise

## 2021-09-13 NOTE — Telephone Encounter (Signed)
Patient notified VIA phone that RX was sent to the pharmacy and advised of her next appointment time. Dm/cma

## 2021-09-19 ENCOUNTER — Other Ambulatory Visit: Payer: Self-pay

## 2021-09-20 ENCOUNTER — Ambulatory Visit (INDEPENDENT_AMBULATORY_CARE_PROVIDER_SITE_OTHER): Payer: Medicare Other | Admitting: Family Medicine

## 2021-09-20 VITALS — BP 118/64 | HR 90 | Temp 97.5°F | Ht 62.0 in | Wt 137.0 lb

## 2021-09-20 DIAGNOSIS — E1165 Type 2 diabetes mellitus with hyperglycemia: Secondary | ICD-10-CM | POA: Diagnosis not present

## 2021-09-20 DIAGNOSIS — E0590 Thyrotoxicosis, unspecified without thyrotoxic crisis or storm: Secondary | ICD-10-CM

## 2021-09-20 DIAGNOSIS — Z794 Long term (current) use of insulin: Secondary | ICD-10-CM | POA: Diagnosis not present

## 2021-09-20 DIAGNOSIS — E782 Mixed hyperlipidemia: Secondary | ICD-10-CM | POA: Diagnosis not present

## 2021-09-20 DIAGNOSIS — F17200 Nicotine dependence, unspecified, uncomplicated: Secondary | ICD-10-CM

## 2021-09-20 DIAGNOSIS — F5101 Primary insomnia: Secondary | ICD-10-CM

## 2021-09-20 LAB — GLUCOSE, RANDOM: Glucose, Bld: 340 mg/dL — ABNORMAL HIGH (ref 70–99)

## 2021-09-20 LAB — HEMOGLOBIN A1C: Hgb A1c MFr Bld: 8 % — ABNORMAL HIGH (ref 4.6–6.5)

## 2021-09-20 MED ORDER — HYDROXYZINE PAMOATE 25 MG PO CAPS: 25.0000 mg | ORAL_CAPSULE | Freq: Three times a day (TID) | ORAL | 0 refills | Status: DC | PRN

## 2021-09-20 MED ORDER — HYDROXYZINE PAMOATE 25 MG PO CAPS: 25.0000 mg | ORAL_CAPSULE | Freq: Every evening | ORAL | 0 refills | Status: DC | PRN

## 2021-09-20 NOTE — Progress Notes (Signed)
[facility] [address] [place] [address] Dept: [phone] Dept Fax: [phone]  Chronic Care Office Visit  Subjective:    Patient ID: Alyssa Weaver, female    DOB: [birth year], 71 y.o..   MRN: [id number]  Chief Complaint  Patient presents with   Follow-up    3 month f/u.  Average BS 95- 120 Not fasting today and no concerns.     History of Present Illness:  Patient is in today for reassessment of chronic medical issues. She is accompanied by a medical interpreter (Alyssa Weaver).    Ms. Alyssa Weaver smokes about 1/3 ppd of cigarettes. Her husband has been successful at quitting. She has not followed through on her promise to him to stop once he had quit for three months.   Ms. Alyssa Weaver has a history of Type 2 diabetes. She is managed on Lantus 22 units daily, metformin 1000 mg daily, and Trulicity injections weekly. She sees Dr. Loanne Weaver (endocrinology). She also has a history of hyperlipidemia and is managed on atorvastatin. Additionally, Ms. Alyssa Weaver has a history of hyperthyroidism. She is managed on PTU. Her last TSH was mildly low.   Ms. Alyssa Weaver complains of insomnia. She notes that she has difficulty initiating sleep about every other night. On the nights she sleeps fully, she does awaken refreshed. If she can't sleep, she will listen to music or get up and walk around.   Past Medical History: Patient Active Problem List   Diagnosis Date Noted   Glaucoma 03/14/2021   Gastroesophageal reflux disease 07/17/2019   Osteoporosis 05/20/2019   Hyperthyroidism 05/20/2019   Tobacco use disorder 01/07/2018   Seasonal allergic rhinitis due to pollen 01/07/2018   Environmental allergies    Type 2 diabetes mellitus with hyperglycemia, with long-term current use of insulin ([place]) 01/04/2016   Hyperlipidemia 01/04/2016   Past Surgical History:  Procedure Laterality Date   APPENDECTOMY  1978   COLONOSCOPY     in [place]   Family History  Problem Relation Age of Onset   Asthma Brother    Thyroid disease Neg Hx    Osteoporosis Neg Hx    Colon cancer Neg Hx    Colon polyps Neg Hx    Stomach cancer Neg Hx    Esophageal cancer Neg Hx    Outpatient Medications Prior to Visit  Medication Sig Dispense Refill   acetaminophen (TYLENOL 8 HOUR) 650 MG CR tablet Take 1 tablet (650 mg total) by mouth every 8 (eight) hours as needed for pain. 20 tablet 0   atorvastatin (LIPITOR) 80 MG tablet Take 1 tablet (80 mg total) by mouth daily. 90 tablet 3   Blood Glucose Monitoring Suppl (AGAMATRIX PRESTO) w/Device KIT Twice daily sugar checks before meals 1 kit 0   Calcium Citrate-Vitamin D (CITRACAL/VITAMIN D) 250-200 MG-UNIT TABS 2 tabs by mouth twice daily 120 each    cetirizine (ZYRTEC) 10 MG tablet Take 1 tablet (10 mg total) by mouth daily. 90 tablet 3   cyclobenzaprine (FLEXERIL) 5 MG tablet Take 1 tablet (5 mg total) by mouth 3 (three) times daily as needed for muscle spasms. 30 tablet 1   Dulaglutide (TRULICITY) 3 XL/2.4MW SOPN INJECT 3 MG (0.5 ML) AS DIRECTED ONCE A WEEK 4 mL 11   Ergocalciferol (VITAMIN D2) 50 MCG (2000 UT) TABS Take by mouth daily.     glucose blood (ONE TOUCH ULTRA TEST) test strip Use to check CBGs qam fasting, 2 hrs postprandial, &  if having hyper or hypoglycemic sxs. Dx E11.65, Z79.4 400 each 4   ibandronate (BONIVA) 150 MG tablet Take 1 tablet (150 mg total) by mouth every 30 (thirty) days. Take in the morning with a full glass of water, on an empty stomach, and do not take anything else by mouth or lie down for the next 30 min. 3 tablet 3   insulin glargine (LANTUS SOLOSTAR) 100 UNIT/ML Solostar Pen Inject 22 Units into the skin every morning. 15 mL 6   latanoprost (XALATAN) 0.005 % ophthalmic solution INSTILL 1 DROP INTO EACH EYE ONCE DAILY IN THE EVENING     metFORMIN (GLUCOPHAGE) 1000 MG tablet Take 1 tablet (1,000 mg total) by mouth 2 (two) times daily with a meal. 180 tablet  3   mometasone (NASONEX) 50 MCG/ACT nasal spray 2 sprays each nostril daily 17 g 12   Olopatadine HCl (PATADAY) 0.2 % SOLN Apply 1 drop to eye 2 (two) times daily. 2.5 mL 2   pantoprazole (PROTONIX) 20 MG tablet Take 1 tablet (20 mg total) by mouth daily as needed for heartburn. 90 tablet 3   propylthiouracil (PTU) 50 MG tablet Take 2 tablets (100 mg total) by mouth daily. 180 tablet 3   No facility-administered medications prior to visit.   No Known Allergies    Objective:   Today's Vitals   09/20/21 1300  BP: 118/64  Pulse: 90  Temp: (!) 97.5 F (36.4 C)  TempSrc: Temporal  SpO2: 98%  Weight: 137 lb (62.1 kg)  Height: _0  (1.575 m)   Body mass index is 25.06 kg/m.   General: Well developed, well nourished. No acute distress. Psych: Alert and oriented. Normal mood and affect.  There are no preventive care reminders to display for this patient.  Lab Results Last lipids Lab Results  Component Value Date   CHOL 86 03/14/2021   HDL 35.40 (L) 03/14/2021   LDLCALC 28 03/14/2021   TRIG 112.0 03/14/2021   CHOLHDL 2 03/14/2021   Last hemoglobin A1c Lab Results  Component Value Date   HGBA1C 7.9 (H) 06/20/2021       Assessment & Plan:   1. Type 2 diabetes mellitus with hyperglycemia, with long-term current use of insulin (HCC) Ms. Alyssa Weaver's A1c has been acceptable. She has not been back to see Dr. Loanne Weaver since prior to her last visit with me. I will check the A1c today to see if she is maintaining this.  - Glucose, random - Hemoglobin A1c  2. Hyperthyroidism On PTU. She will follow-up with Alyssa Weaver on this.  3. Mixed hyperlipidemia Lipids have been at goal. Continue atorvastatin.  4. Tobacco use disorder I strongly urge Ms. Alyssa Weaver to stop smoking, pointing out the advantages to her health.  5. Primary insomnia We discussed some basics of sleep hygiene. I recommend she try chamomile tea or melatonin as first line options for sleep. When ineffective, we will  try hydroxyzine  - hydrOXYzine (VISTARIL) 25 MG capsule; Take 1 capsule (25 mg total) by mouth at bedtime as needed (for sleep).  Dispense: 30 capsule; Refill: 0  Alyssa Salter, MD

## 2021-09-26 ENCOUNTER — Other Ambulatory Visit: Payer: Self-pay

## 2021-09-26 DIAGNOSIS — M810 Age-related osteoporosis without current pathological fracture: Secondary | ICD-10-CM

## 2021-09-26 MED ORDER — IBANDRONATE SODIUM 150 MG PO TABS: 150.0000 mg | ORAL_TABLET | ORAL | 3 refills | Status: DC

## 2021-09-26 NOTE — Telephone Encounter (Signed)
Refill request for: Ibandronate 150 mg LR 09/03/20, #3, 3 rfs LOV 09/20/21 FOV  12/21/21  Please review and advise.  Thanks.  Dm/cma

## 2021-09-27 ENCOUNTER — Other Ambulatory Visit: Payer: Self-pay

## 2021-09-27 ENCOUNTER — Ambulatory Visit: Payer: Medicare Other | Admitting: Endocrinology

## 2021-09-27 VITALS — BP 110/70 | HR 87 | Ht 62.0 in | Wt 136.6 lb

## 2021-09-27 DIAGNOSIS — E0590 Thyrotoxicosis, unspecified without thyrotoxic crisis or storm: Secondary | ICD-10-CM

## 2021-09-27 DIAGNOSIS — M810 Age-related osteoporosis without current pathological fracture: Secondary | ICD-10-CM | POA: Diagnosis not present

## 2021-09-27 LAB — BASIC METABOLIC PANEL
BUN: 16 mg/dL (ref 6–23)
CO2: 25 mEq/L (ref 19–32)
Calcium: 9 mg/dL (ref 8.4–10.5)
Chloride: 100 mEq/L (ref 96–112)
Creatinine, Ser: 0.74 mg/dL (ref 0.40–1.20)
GFR: 82.61 mL/min (ref >60.00)
Glucose, Bld: 239 mg/dL — ABNORMAL HIGH (ref 70–99)
Potassium: 4 mEq/L (ref 3.5–5.1)
Sodium: 139 mEq/L (ref 135–145)

## 2021-09-27 LAB — T4, FREE: Free T4: 0.72 ng/dL (ref 0.60–1.60)

## 2021-09-27 LAB — TSH: TSH: 0.42 u[IU]/mL (ref 0.35–5.50)

## 2021-09-27 LAB — VITAMIN D 25 HYDROXY (VIT D DEFICIENCY, FRACTURES): VITD: 63.89 ng/mL (ref 30.00–100.00)

## 2021-09-27 NOTE — Patient Instructions (Addendum)
Please increase the propylthiouracil to 2 pills per day.   Blood tests are requested for you today.  We'll let you know about the results.   If ever you have fever while taking propylthiouracil, stop it and call us, even if the reason is obvious, because of the risk of a rare side-effect.   It is best to never miss the propylthiouracil.  However, if you do miss it, next best is to double up the next time.   We'll let you know about the bone density results.   Please come back for a follow-up appointment in May.     ??????????? propylthiouracil ???? 2 ??????????. ????????????????????????????????????. ???????????????????????????????????????. ??????????????????????????????? propylthiouracil, ???????????????????????, ???????????????????????, ???????????????????????????????????????????. ???????????????????????? propylthiouracil. ???????????, ?????????????, ???????????????????????????????????????????????. ???????????????????????????????????????????????????????????. ?????????????????????????????????????? 2-3 ?????.

## 2021-09-27 NOTE — Progress Notes (Signed)
Subjective:    Patient ID: Alyssa Weaver, female    DOB: 1951-06-24, 71 y.o.   MRN: [id number]  HPI husband translates.  Pt returns for f/u of hyperthyroidism (dx'ed 2019; she was rx'ed tapazole in 2020 (she declines RAI rx); US showed MNG; bxs of 2 left sided nodules in 2018 showed beth cat 2: [place] says no f/u is needed; 2 weeks after starting tapazole, she developed slight pain at the mid-abdomen, but no assoc n/v.  sxs resolved after stopping it, so she was changed to PTU).   She takes PTU just 50 mg qd.   Pt returns for f/u of osteoporosis:  Dx'ed: 2020 Secondary cause: smoking and hyperthyroidism.   Fractures: none Past rx: alendronate for a few weeks in 2020 Current rx: ibandronate since 2020, and Vit-D, 2000 units/d.   Last DEXA result (2020) worst T-score (spine) was -2.5.   Other: none.   Interval hx: pt states she feels well in general.  She is sched for DEXA on 10/27/21.   Past Medical History:  Diagnosis Date   Allergy    Diabetes mellitus without complication (HCC)    GERD (gastroesophageal reflux disease)    Hyperlipidemia    Hypertension    Osteoporosis     Past Surgical History:  Procedure Laterality Date   APPENDECTOMY  1978   COLONOSCOPY     in [place]    Social History   Socioeconomic History   Marital status: Married    Spouse name: [name]   Number of children: 5   Years of education: 13   Highest education level: Not on file  Occupational History   Occupation: housewife  Tobacco Use   Smoking status: Every Day    Packs/day: 0.25    Years: 43.00    Pack years: 10.75    Types: Cigarettes   Smokeless tobacco: Never   Tobacco comments:    Husband smokes 1 ppd.  He might be willing to quit at same time.  Vaping Use   Vaping Use: Never used  Substance and Sexual Activity   Alcohol use: No   Drug use: No   Sexual activity: Yes    Birth control/protection: Post-menopausal  Other Topics Concern   Not on file  Social History  Narrative   Originally from [place]   Refugees--lived in [place] 2 years.   Came to the [place] In [place].   Lived in the [place] until moved to [facility] Nov. 2016   Social Determinants of Health   Financial Resource Strain: Not on file  Food Insecurity: Not on file  Transportation Needs: Not on file  Physical Activity: Not on file  Stress: Not on file  Social Connections: Not on file  Intimate Partner Violence: Not on file    Current Outpatient Medications on File Prior to Visit  Medication Sig Dispense Refill   acetaminophen (TYLENOL 8 HOUR) 650 MG CR tablet Take 1 tablet (650 mg total) by mouth every 8 (eight) hours as needed for pain. 20 tablet 0   atorvastatin (LIPITOR) 80 MG tablet Take 1 tablet (80 mg total) by mouth daily. 90 tablet 3   Blood Glucose Monitoring Suppl (AGAMATRIX PRESTO) w/Device KIT Twice daily sugar checks before meals 1 kit 0   Calcium Citrate-Vitamin D (CITRACAL/VITAMIN D) 250-200 MG-UNIT TABS 2 tabs by mouth twice daily 120 each    cetirizine (ZYRTEC) 10 MG tablet Take 1 tablet (10 mg total) by mouth daily. 90 tablet 3  cyclobenzaprine (FLEXERIL) 5 MG tablet Take 1 tablet (5 mg total) by mouth 3 (three) times daily as needed for muscle spasms. 30 tablet 1   Dulaglutide (TRULICITY) 3 GU/5.4YH SOPN INJECT 3 MG (0.5 ML) AS DIRECTED ONCE A WEEK 4 mL 11   Ergocalciferol (VITAMIN D2) 50 MCG (2000 UT) TABS Take by mouth daily.     glucose blood (ONE TOUCH ULTRA TEST) test strip Use to check CBGs qam fasting, 2 hrs postprandial, & if having hyper or hypoglycemic sxs. Dx E11.65, Z79.4 400 each 4   hydrOXYzine (VISTARIL) 25 MG capsule Take 1 capsule (25 mg total) by mouth at bedtime as needed (for sleep). 30 capsule 0   ibandronate (BONIVA) 150 MG tablet Take 1 tablet (150 mg total) by mouth every 30 (thirty) days. Take in the morning with a full glass of water, on an empty stomach, and do not take anything else by mouth or lie down for the next 30 min. 3 tablet 3   insulin  glargine (LANTUS SOLOSTAR) 100 UNIT/ML Solostar Pen Inject 22 Units into the skin every morning. 15 mL 6   latanoprost (XALATAN) 0.005 % ophthalmic solution INSTILL 1 DROP INTO EACH EYE ONCE DAILY IN THE EVENING     metFORMIN (GLUCOPHAGE) 1000 MG tablet Take 1 tablet (1,000 mg total) by mouth 2 (two) times daily with a meal. 180 tablet 3   mometasone (NASONEX) 50 MCG/ACT nasal spray 2 sprays each nostril daily 17 g 12   Olopatadine HCl (PATADAY) 0.2 % SOLN Apply 1 drop to eye 2 (two) times daily. 2.5 mL 2   pantoprazole (PROTONIX) 20 MG tablet Take 1 tablet (20 mg total) by mouth daily as needed for heartburn. 90 tablet 3   propylthiouracil (PTU) 50 MG tablet Take 2 tablets (100 mg total) by mouth daily. 180 tablet 3   No current facility-administered medications on file prior to visit.    No Known Allergies  Family History  Problem Relation Age of Onset   Asthma Brother    Thyroid disease Neg Hx    Osteoporosis Neg Hx    Colon cancer Neg Hx    Colon polyps Neg Hx    Stomach cancer Neg Hx    Esophageal cancer Neg Hx     BP 110/70 (BP Location: Left Arm, Patient Position: Sitting, Cuff Size: Normal)    Pulse 87    Ht '5\' 2"'  (1.575 m)    Wt 136 lb 9.6 oz (62 kg)    SpO2 97%    BMI 24.98 kg/m    Review of Systems     Objective:   Physical Exam VITAL SIGNS:  See vs page GENERAL: no distress NECK: There is no palpable thyroid enlargement.  No thyroid nodule is palpable.  No palpable lymphadenopathy at the anterior neck.   Lab Results  Component Value Date   TSH 0.42 09/27/2021       Assessment & Plan:  Hyperthyroidism: well-controlled.  Please continue the same Vit-D. Osteoporosis: recheck DEXA and labs

## 2021-09-28 LAB — PTH, INTACT AND CALCIUM
Calcium: 9 mg/dL (ref 8.6–10.4)
PTH: 43 pg/mL (ref 16–77)

## 2021-10-27 ENCOUNTER — Ambulatory Visit
Admission: RE | Admit: 2021-10-27 | Discharge: 2021-10-27 | Disposition: A | Discharge status: Home / Self Care | Payer: Medicare Other | Source: Ambulatory Visit | Attending: Endocrinology | Admitting: Endocrinology

## 2021-10-27 DIAGNOSIS — M810 Age-related osteoporosis without current pathological fracture: Secondary | ICD-10-CM

## 2021-11-23 ENCOUNTER — Other Ambulatory Visit: Payer: Self-pay | Admitting: Endocrinology

## 2021-12-12 ENCOUNTER — Other Ambulatory Visit: Payer: Self-pay | Admitting: Family Medicine

## 2021-12-12 DIAGNOSIS — Z1211 Encounter for screening for malignant neoplasm of colon: Secondary | ICD-10-CM

## 2021-12-12 NOTE — Progress Notes (Signed)
Report from [facility] noting Cologuard could not be run and need to resubmit. I will place order. ?

## 2021-12-13 ENCOUNTER — Ambulatory Visit: Payer: Medicare Other | Admitting: Endocrinology

## 2021-12-21 ENCOUNTER — Ambulatory Visit (INDEPENDENT_AMBULATORY_CARE_PROVIDER_SITE_OTHER): Payer: Medicare Other | Admitting: Family Medicine

## 2021-12-21 VITALS — BP 126/64 | HR 87 | Temp 97.8°F | Ht 62.0 in | Wt 138.0 lb

## 2021-12-21 DIAGNOSIS — E0590 Thyrotoxicosis, unspecified without thyrotoxic crisis or storm: Secondary | ICD-10-CM

## 2021-12-21 DIAGNOSIS — E1165 Type 2 diabetes mellitus with hyperglycemia: Secondary | ICD-10-CM | POA: Diagnosis not present

## 2021-12-21 DIAGNOSIS — W57XXXA Bitten or stung by nonvenomous insect and other nonvenomous arthropods, initial encounter: Secondary | ICD-10-CM

## 2021-12-21 DIAGNOSIS — M810 Age-related osteoporosis without current pathological fracture: Secondary | ICD-10-CM

## 2021-12-21 DIAGNOSIS — Z794 Long term (current) use of insulin: Secondary | ICD-10-CM

## 2021-12-21 DIAGNOSIS — S70361A Insect bite (nonvenomous), right thigh, initial encounter: Secondary | ICD-10-CM | POA: Diagnosis not present

## 2021-12-21 LAB — URINALYSIS, ROUTINE W REFLEX MICROSCOPIC
Bilirubin Urine: NEGATIVE
Hgb urine dipstick: NEGATIVE
Nitrite: NEGATIVE
Specific Gravity, Urine: <=1.005 — AB (ref 1.000–1.030)
Total Protein, Urine: NEGATIVE
Urine Glucose: >=1000 — AB
Urobilinogen, UA: 0.2 (ref 0.0–1.0)
pH: 6 (ref 5.0–8.0)

## 2021-12-21 LAB — BASIC METABOLIC PANEL
BUN: 8 mg/dL (ref 6–23)
CO2: 24 mEq/L (ref 19–32)
Calcium: 9.3 mg/dL (ref 8.4–10.5)
Chloride: 102 mEq/L (ref 96–112)
Creatinine, Ser: 0.69 mg/dL (ref 0.40–1.20)
GFR: 88.47 mL/min (ref >60.00)
Glucose, Bld: 285 mg/dL — ABNORMAL HIGH (ref 70–99)
Potassium: 4.1 mEq/L (ref 3.5–5.1)
Sodium: 139 mEq/L (ref 135–145)

## 2021-12-21 LAB — MICROALBUMIN / CREATININE URINE RATIO
Creatinine,U: 69.7 mg/dL
Microalb Creat Ratio: 2.4 mg/g (ref 0.0–30.0)
Microalb, Ur: 1.7 mg/dL (ref 0.0–1.9)

## 2021-12-21 LAB — LIPID PANEL
Cholesterol: 90 mg/dL (ref 0–200)
HDL: 42.2 mg/dL (ref >39.00)
LDL Cholesterol: 28 mg/dL (ref 0–99)
NonHDL: 47.94
Total CHOL/HDL Ratio: 2
Triglycerides: 99 mg/dL (ref 0.0–149.0)
VLDL: 19.8 mg/dL (ref 0.0–40.0)

## 2021-12-21 LAB — HEMOGLOBIN A1C: Hgb A1c MFr Bld: 7.9 % — ABNORMAL HIGH (ref 4.6–6.5)

## 2021-12-21 MED ORDER — GLUCOSE 4 G PO CHEW: 1.0000 | CHEWABLE_TABLET | ORAL | 12 refills | Status: AC | PRN

## 2021-12-21 MED ORDER — DOXYCYCLINE HYCLATE 100 MG PO TABS: 100.0000 mg | ORAL_TABLET | Freq: Two times a day (BID) | ORAL | 0 refills | Status: DC

## 2021-12-21 NOTE — Progress Notes (Signed)
[facility] [address] [place] [address] Dept: [phone] Dept Fax: [phone]  Chronic Care Office Visit  Subjective:    Patient ID: Tomia Enlow, female    DOB: 10-07-1950, 71 y.o..   MRN: [id number]  Chief Complaint  Patient presents with   Follow-up    3 month f/u. C/o having a tick bite on the inside of Rt Leg x 5 days (rash).  BS readings: 85 in am, 110 afternoon, 61 in evening, and was 45 at 2 am (shaking).      History of Present Illness:  Patient is in today for reassessment of chronic medical issues. She is accompanied by a medical interpreter ([place]).    Ms. Miracle smokes about 1/3 ppd of cigarettes. She continues to smoke, but notes she is tryign to quit.   Ms. Gienger has a history of Type 2 diabetes. She is managed on Lantus 22 units daily, metformin 1000 mg daily, and dulaglutide (Trulicity) 3 mg weekly injections weekly. She sees Dr. Loanne Drilling (endocrinology), who is stopping practice. She is scheduled with a new endocrinologist in July. She describes an episode of hypoglycemia recently. This occurred after she took her morning insulin and metformin, but then did not eat. She responded by drinking juice and eating sticky rice.  She also has a history of hyperlipidemia and is managed on atorvastatin 80 mg daily. Additionally, Ms. Bari has a history of hyperthyroidism. She is managed on PTU.    Ms. Ridings has a history of osteoporosis. She is on ibandronate 150 mg once a month and daily calcium + Vit. D  Ms. Uplinger notes she had a tick bite three days ago on her right thigh.  Past Medical History: Patient Active Problem List   Diagnosis Date Noted   Glaucoma 03/14/2021   Gastroesophageal reflux disease 07/17/2019   Osteoporosis 05/20/2019   Hyperthyroidism 05/20/2019   Tobacco use disorder 01/07/2018   Seasonal allergic rhinitis due to pollen 01/07/2018   Environmental allergies     Type 2 diabetes mellitus with hyperglycemia, with long-term current use of insulin ([place]) 01/04/2016   Hyperlipidemia 01/04/2016   Past Surgical History:  Procedure Laterality Date   APPENDECTOMY  1978   COLONOSCOPY     in [place]   Family History  Problem Relation Age of Onset   Asthma Brother    Thyroid disease Neg Hx    Osteoporosis Neg Hx    Colon cancer Neg Hx    Colon polyps Neg Hx    Stomach cancer Neg Hx    Esophageal cancer Neg Hx    Outpatient Medications Prior to Visit  Medication Sig Dispense Refill   acetaminophen (TYLENOL 8 HOUR) 650 MG CR tablet Take 1 tablet (650 mg total) by mouth every 8 (eight) hours as needed for pain. 20 tablet 0   atorvastatin (LIPITOR) 80 MG tablet Take 1 tablet (80 mg total) by mouth daily. 90 tablet 3   Blood Glucose Monitoring Suppl (AGAMATRIX PRESTO) w/Device KIT Twice daily sugar checks before meals 1 kit 0   Calcium Citrate-Vitamin D (CITRACAL/VITAMIN D) 250-200 MG-UNIT TABS 2 tabs by mouth twice daily 120 each    cetirizine (ZYRTEC) 10 MG tablet Take 1 tablet (10 mg total) by mouth daily. 90 tablet 3   cyclobenzaprine (FLEXERIL) 5 MG tablet Take 1 tablet (5 mg total) by mouth 3 (three) times daily as needed for muscle spasms. 30 tablet 1   Dulaglutide (TRULICITY) 3  MG/0.5ML SOPN INJECT 3 MG (0.5 ML) AS DIRECTED ONCE A WEEK 4 mL 11   Ergocalciferol (VITAMIN D2) 50 MCG (2000 UT) TABS Take by mouth daily.     glucose blood (ONE TOUCH ULTRA TEST) test strip Use to check CBGs qam fasting, 2 hrs postprandial, & if having hyper or hypoglycemic sxs. Dx E11.65, Z79.4 400 each 4   hydrOXYzine (VISTARIL) 25 MG capsule Take 1 capsule (25 mg total) by mouth at bedtime as needed (for sleep). 30 capsule 0   ibandronate (BONIVA) 150 MG tablet Take 1 tablet (150 mg total) by mouth every 30 (thirty) days. Take in the morning with a full glass of water, on an empty stomach, and do not take anything else by mouth or lie down for the next 30  min. 3 tablet 3   insulin glargine (LANTUS SOLOSTAR) 100 UNIT/ML Solostar Pen Inject 22 Units into the skin every morning. 15 mL 6   latanoprost (XALATAN) 0.005 % ophthalmic solution INSTILL 1 DROP INTO EACH EYE ONCE DAILY IN THE EVENING     metFORMIN (GLUCOPHAGE) 1000 MG tablet Take 1 tablet (1,000 mg total) by mouth 2 (two) times daily with a meal. 180 tablet 3   mometasone (NASONEX) 50 MCG/ACT nasal spray 2 sprays each nostril daily 17 g 12   Olopatadine HCl (PATADAY) 0.2 % SOLN Apply 1 drop to eye 2 (two) times daily. 2.5 mL 2   pantoprazole (PROTONIX) 20 MG tablet Take 1 tablet (20 mg total) by mouth daily as needed for heartburn. 90 tablet 3   propylthiouracil (PTU) 50 MG tablet Take 1 tablet by mouth once daily 90 tablet 0   No facility-administered medications prior to visit.   No Known Allergies    Objective:   Today's Vitals   12/21/21 1301  BP: 126/64  Pulse: 87  Temp: 97.8 F (36.6 C)  TempSrc: Temporal  SpO2: 97%  Weight: 138 lb (62.6 kg)  Height: '5\' 2"'  (1.575 m)   Body mass index is 25.24 kg/m.   General: Well developed, well nourished. No acute distress. Skin: Warm and dry. There is a bite wound on the right inner thigh with an irregular brown rash spreading out in a ring from this.  Neuro: CN II-XII intact. Normal sensation and DTR bilaterally. Psych: Alert and oriented. Normal mood and affect.  Health Maintenance Due  Topic Date Due   URINE MICROALBUMIN  12/03/2021     Assessment & Plan:   1. Type 2 diabetes mellitus with hyperglycemia, with long-term current use of insulin ([place]) I will order annual DM labs. Ms. Stolze will continue her metformin, Lantus, and Trulicity. She will follow through with her visit to Dr. Dwyane Dee in July to establish care. I reviewed how she shodul respond to hypoglycemia. I recommended adding white table sugar to her sticky rice to address the acute low blood sugar and then following with a small meal. She did request glucose  tablets.  - Lipid panel - Microalbumin / creatinine urine ratio - Basic metabolic panel - Hemoglobin A1c - Urinalysis, Routine w reflex microscopic - glucose 4 GM chewable tablet; Chew 1 tablet (4 g total) by mouth as needed for low blood sugar.  Dispense: 50 tablet; Refill: 12  2. Hyperthyroidism Stable on PTU. This will be followed by her new endocrinologist.  3. Tick bite of right thigh, initial encounter The rash around the tick bite could be an early erythema migrans. I recommend we go ahead and treat with a course of  doxycycline.  - doxycycline (VIBRA-TABS) 100 MG tablet; Take 1 tablet (100 mg total) by mouth 2 (two) times daily.  Dispense: 20 tablet; Refill: 0   Return in about 4 months (around 04/23/2022) for Reassessment.   Haydee Salter, MD

## 2021-12-27 LAB — COLOGUARD: COLOGUARD: NEGATIVE

## 2022-01-10 ENCOUNTER — Other Ambulatory Visit: Payer: Self-pay

## 2022-01-10 ENCOUNTER — Telehealth: Payer: Self-pay | Admitting: Family Medicine

## 2022-01-10 MED ORDER — MOMETASONE FUROATE 50 MCG/ACT NA SUSP: NASAL | 5 refills | Status: DC

## 2022-01-10 NOTE — Telephone Encounter (Signed)
RX sent to pharmacy and patient notified VIA phone. Dm/cma

## 2022-01-10 NOTE — Telephone Encounter (Signed)
Pt is requesting that her mometasone nasal spray be refilled.

## 2022-02-05 ENCOUNTER — Other Ambulatory Visit: Payer: Self-pay | Admitting: Endocrinology

## 2022-02-05 DIAGNOSIS — E0590 Thyrotoxicosis, unspecified without thyrotoxic crisis or storm: Secondary | ICD-10-CM

## 2022-02-06 ENCOUNTER — Other Ambulatory Visit: Payer: Medicare Other

## 2022-02-09 ENCOUNTER — Other Ambulatory Visit (INDEPENDENT_AMBULATORY_CARE_PROVIDER_SITE_OTHER): Payer: Medicare Other

## 2022-02-09 DIAGNOSIS — E0590 Thyrotoxicosis, unspecified without thyrotoxic crisis or storm: Secondary | ICD-10-CM | POA: Diagnosis not present

## 2022-02-09 LAB — T4, FREE: Free T4: 0.96 ng/dL (ref 0.60–1.60)

## 2022-02-09 LAB — TSH: TSH: 0.46 u[IU]/mL (ref 0.35–5.50)

## 2022-02-10 LAB — THYROTROPIN RECEPTOR AUTOABS: Thyrotropin Receptor Ab: <1.1 IU/L (ref 0.00–1.75)

## 2022-02-12 NOTE — Progress Notes (Unsigned)
Patient ID: Alyssa Weaver, female   DOB: 02/03/1951, 71 y.o.   MRN: [id number]                                                                                                               Reason for Appointment: Follow-up of thyroid  Chief complaint: Check thyroid    History of Present Illness:   Patient was initially evaluated for endocrinology management because of abnormal TSH in 2018 checked by her PCP She did not have any symptoms at that time of weight loss, palpitations, weakness Did not have any history of neck swelling or difficulty swallowing  Her highest baseline free T4 was only about 1.4 Free T3 has not been higher than 3.2  However she was initially given methimazole in 2020 and subsequently has been on PTU She did not feel any different with starting the PTU Although she has been told to take PTU twice a day she is only taking 1 tablet daily for quite some time  Wt Readings from Last 3 Encounters:  02/13/22 139 lb 3.2 oz (63.1 kg)  12/21/21 138 lb (62.6 kg)  09/27/21 136 lb 9.6 oz (62 kg)    Thyroid function tests as follows:     Baseline TSH 0.135 in 2018, was 0.2 in 2019  Lab Results  Component Value Date   FREET4 0.96 02/09/2022   FREET4 0.72 09/27/2021   FREET4 0.82 05/23/2021   T3FREE 2.8 07/19/2018   T3FREE 2.6 03/06/2018   T3FREE 2.5 01/07/2018   TSH 0.46 02/09/2022   TSH 0.42 09/27/2021   TSH 0.32 (L) 05/23/2021   Lab Results  Component Value Date   FREET4 0.96 02/09/2022   FREET4 0.72 09/27/2021   FREET4 0.82 05/23/2021   FREET4 0.82 01/25/2021   FREET4 0.98 10/25/2020   FREET4 1.30 05/03/2020   FREET4 0.98 07/01/2019   FREET4 1.28 02/18/2019   FREET4 1.31 07/19/2018    Lab Results  Component Value Date   THYROTRECAB <1.10 02/09/2022   THYROID nodules:  She had an incidental thyroid nodule discovered on a CT scan and subsequently was sent for ultrasound and thyroid biopsies  On: 01/23/2018 Heterogeneous complex  multinodular thyroid.   Recommend biopsy of the 2 most suspicious nodules, nodule 7 and 8  Needle aspiration showed Bethesda category 2 cytology and no further follow-up recommended     Allergies as of 02/13/2022   No Known Allergies      Medication List        Accurate as of February 13, 2022  2:31 PM. If you have any questions, ask your nurse or doctor.          acetaminophen 650 MG CR tablet Commonly known as: Tylenol 8 Hour Take 1 tablet (650 mg total) by mouth every 8 (eight) hours as needed for pain.   AgaMatrix Presto w/Device Kit Twice daily sugar checks before meals   atorvastatin 80 MG tablet Commonly known as: LIPITOR Take 1 tablet (80 mg total) by mouth daily.   Calcium Citrate-Vitamin D  250-200 MG-UNIT Tabs Commonly known as: Citracal/Vitamin D 2 tabs by mouth twice daily   cetirizine 10 MG tablet Commonly known as: ZYRTEC Take 1 tablet (10 mg total) by mouth daily.   cyclobenzaprine 5 MG tablet Commonly known as: FLEXERIL Take 1 tablet (5 mg total) by mouth 3 (three) times daily as needed for muscle spasms.   doxycycline 100 MG tablet Commonly known as: VIBRA-TABS Take 1 tablet (100 mg total) by mouth 2 (two) times daily.   glucose 4 GM chewable tablet Chew 1 tablet (4 g total) by mouth as needed for low blood sugar.   glucose blood test strip Commonly known as: ONE TOUCH ULTRA TEST Use to check CBGs qam fasting, 2 hrs postprandial, & if having hyper or hypoglycemic sxs. Dx E11.65, Z79.4   hydrOXYzine 25 MG capsule Commonly known as: VISTARIL Take 1 capsule (25 mg total) by mouth at bedtime as needed (for sleep).   ibandronate 150 MG tablet Commonly known as: BONIVA Take 1 tablet (150 mg total) by mouth every 30 (thirty) days. Take in the morning with a full glass of water, on an empty stomach, and do not take anything else by mouth or lie down for the next 30 min.   Lantus SoloStar 100 UNIT/ML Solostar Pen Generic drug: insulin  glargine Inject 22 Units into the skin every morning.   latanoprost 0.005 % ophthalmic solution Commonly known as: XALATAN INSTILL 1 DROP INTO EACH EYE ONCE DAILY IN THE EVENING   metFORMIN 1000 MG tablet Commonly known as: GLUCOPHAGE Take 1 tablet (1,000 mg total) by mouth 2 (two) times daily with a meal.   mometasone 50 MCG/ACT nasal spray Commonly known as: Nasonex 2 sprays each nostril daily   Olopatadine HCl 0.2 % Soln Commonly known as: Pataday Apply 1 drop to eye 2 (two) times daily.   pantoprazole 20 MG tablet Commonly known as: PROTONIX Take 1 tablet (20 mg total) by mouth daily as needed for heartburn.   propylthiouracil 50 MG tablet Commonly known as: PTU Take 1 tablet by mouth once daily   Trulicity 3 TW/6.5KC Sopn Generic drug: Dulaglutide INJECT 3 MG (0.5 ML) AS DIRECTED ONCE A WEEK   Vitamin D2 50 MCG (2000 UT) Tabs Take by mouth daily.            Past Medical History:  Diagnosis Date   Allergy    Diabetes mellitus without complication ([place])    GERD (gastroesophageal reflux disease)    Hyperlipidemia    Hypertension    Osteoporosis     Past Surgical History:  Procedure Laterality Date   APPENDECTOMY  1978   COLONOSCOPY     in [place]    Family History  Problem Relation Age of Onset   Asthma Brother    Thyroid disease Neg Hx    Osteoporosis Neg Hx    Colon cancer Neg Hx    Colon polyps Neg Hx    Stomach cancer Neg Hx    Esophageal cancer Neg Hx     Social History:  reports that she has been smoking cigarettes. She has a 10.75 pack-year smoking history. She has never used smokeless tobacco. She reports that she does not drink alcohol and does not use drugs.  Allergies: No Known Allergies   Review of Systems  Constitutional:  Negative for weight loss.  Cardiovascular:  Negative for palpitations.   OSTEOPOROSIS: She was started on Boniva after her screening bone density in 05/2019 showed  osteoporosis at the spine  with T score -2.5 She takes this regularly without side effect Last vitamin D level also normal  Bone density results: T-score AP Spine L1-L4 10/27/2021 69.4 -1.9 0.947 g/cm2 * AP Spine  L1-L4      05/13/2019    66.9         -2.5    0.882 g/cm2   DualFemur Neck Left  10/27/2021    69.4         -1.3    0.860 g/cm2 DualFemur Neck Left  05/13/2019    66.9         -1.5    0.828 g/cm2   DualFemur Total Mean 10/27/2021    69.4         -0.3    0.970 g/cm2 DualFemur Total Mean 05/13/2019    66.9         -0.4    0.961 g/cm2  DIABETES: She is on basal insulin and Trulicity Lab blood sugars are consistently over 200  Lab Results  Component Value Date   HGBA1C 7.9 (H) 12/21/2021   HGBA1C 8.0 (H) 09/20/2021   HGBA1C 7.9 (H) 06/20/2021   Lab Results  Component Value Date   MICROALBUR 1.7 12/21/2021   LDLCALC 28 12/21/2021   CREATININE 0.69 12/21/2021      Examination:   BP 128/72 (BP Location: Left Arm, Patient Position: Sitting, Cuff Size: Normal)   Pulse 94   Ht '5\' 2"'  (1.575 m)   Wt 139 lb 3.2 oz (63.1 kg)   SpO2 100%   BMI 25.46 kg/m    General Appearance:  well-built and nourished, pleasant, not anxious or hyperkinetic.         Eyes: No abnormal prominence or stare present.  No swelling of the eyelids   Neck: The thyroid is not clearly palpable on the left There appears to be approximately 1.5-2 cm nodule on the right medial lobe felt only on swallowing There is no lymphadenopathy in the neck .           Heart: normal S1 and S2, no murmurs .           Extremities: hands are not unusually warm.  No ankle edema.  Neurological:  No fine tremors are present. Deep tendon reflexes at biceps are normal.    Assessment/Plan:  Multinodular goiter  She has a history of an autonomously functioning thyroid noted to multinodular goiter She has not had any true hyperthyroidism with no increase in free T4 or free T3 documented Also her TSH has never been suppressed below 0.1  indicating subclinical hyperthyroidism  With her thyrotropin receptor antibody being undetectable she does not have Graves' disease either  Also her nodular goiter has been evaluated previously with ultrasounds and thyroid needle aspiration which indicated benign nodules  Recommendations:  Since she is only on the minimal dose of 50 mg PTU she can stop this Follow-up thyroid levels in about 2 months and if completely normal follow annually Follow thyroid enlargement clinically  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  Her spine had improved bone density in March and now is showing osteopenia only  Recommended that she will complete a 5-year course of Boniva and then likely stop for at least a year Continue calcium and vitamin D supplement  DIABETES: Her diabetes is poorly controlled with A1c consistently around 8% She likely has consistently high postprandial readings with not getting any mealtime insulin She will continue to follow-up with her PCP but recommended  that her husband will discuss with her PCP if endocrinology consultation is needed for diabetes management  Elayne Snare 02/13/2022, 2:31 PM    Note: This office note was prepared with Dragon voice recognition system technology. Any transcriptional errors that result from this process are unintentional.

## 2022-02-13 ENCOUNTER — Encounter: Payer: Self-pay | Admitting: Endocrinology

## 2022-02-13 ENCOUNTER — Ambulatory Visit: Payer: Medicare Other | Admitting: Endocrinology

## 2022-02-13 VITALS — BP 128/72 | HR 94 | Ht 62.0 in | Wt 139.2 lb

## 2022-02-13 DIAGNOSIS — E1165 Type 2 diabetes mellitus with hyperglycemia: Secondary | ICD-10-CM | POA: Diagnosis not present

## 2022-02-13 DIAGNOSIS — E042 Nontoxic multinodular goiter: Secondary | ICD-10-CM

## 2022-02-13 DIAGNOSIS — M810 Age-related osteoporosis without current pathological fracture: Secondary | ICD-10-CM | POA: Diagnosis not present

## 2022-02-13 DIAGNOSIS — Z794 Long term (current) use of insulin: Secondary | ICD-10-CM

## 2022-02-13 DIAGNOSIS — R7989 Other specified abnormal findings of blood chemistry: Secondary | ICD-10-CM | POA: Diagnosis not present

## 2022-02-13 NOTE — Patient Instructions (Addendum)
Stop PTU  Talk To Dr Gena Fray about diabetes

## 2022-02-16 ENCOUNTER — Other Ambulatory Visit: Payer: Self-pay | Admitting: Family Medicine

## 2022-02-16 DIAGNOSIS — E1165 Type 2 diabetes mellitus with hyperglycemia: Secondary | ICD-10-CM

## 2022-03-02 LAB — HM DIABETES EYE EXAM

## 2022-03-03 ENCOUNTER — Encounter: Payer: Self-pay | Admitting: Family Medicine

## 2022-03-07 ENCOUNTER — Other Ambulatory Visit: Payer: Self-pay

## 2022-03-15 ENCOUNTER — Other Ambulatory Visit: Payer: Self-pay | Admitting: Family Medicine

## 2022-03-28 ENCOUNTER — Other Ambulatory Visit: Payer: Self-pay | Admitting: Family Medicine

## 2022-03-28 DIAGNOSIS — E782 Mixed hyperlipidemia: Secondary | ICD-10-CM

## 2022-04-17 ENCOUNTER — Other Ambulatory Visit (INDEPENDENT_AMBULATORY_CARE_PROVIDER_SITE_OTHER): Payer: Medicare Other

## 2022-04-17 DIAGNOSIS — E042 Nontoxic multinodular goiter: Secondary | ICD-10-CM | POA: Diagnosis not present

## 2022-04-17 LAB — TSH: TSH: 0.36 u[IU]/mL (ref 0.35–5.50)

## 2022-04-17 LAB — T4, FREE: Free T4: 0.92 ng/dL (ref 0.60–1.60)

## 2022-04-20 ENCOUNTER — Ambulatory Visit: Payer: Medicare Other | Admitting: Endocrinology

## 2022-04-24 ENCOUNTER — Ambulatory Visit (INDEPENDENT_AMBULATORY_CARE_PROVIDER_SITE_OTHER): Payer: Medicare Other | Admitting: Family Medicine

## 2022-04-24 ENCOUNTER — Encounter: Payer: Self-pay | Admitting: Family Medicine

## 2022-04-24 VITALS — BP 114/62 | HR 80 | Temp 97.8°F | Ht 62.0 in | Wt 139.0 lb

## 2022-04-24 DIAGNOSIS — M810 Age-related osteoporosis without current pathological fracture: Secondary | ICD-10-CM

## 2022-04-24 DIAGNOSIS — Z794 Long term (current) use of insulin: Secondary | ICD-10-CM

## 2022-04-24 DIAGNOSIS — Z23 Encounter for immunization: Secondary | ICD-10-CM

## 2022-04-24 DIAGNOSIS — F17200 Nicotine dependence, unspecified, uncomplicated: Secondary | ICD-10-CM | POA: Diagnosis not present

## 2022-04-24 DIAGNOSIS — E1165 Type 2 diabetes mellitus with hyperglycemia: Secondary | ICD-10-CM

## 2022-04-24 DIAGNOSIS — E042 Nontoxic multinodular goiter: Secondary | ICD-10-CM

## 2022-04-24 LAB — GLUCOSE, RANDOM: Glucose, Bld: 187 mg/dL — ABNORMAL HIGH (ref 70–99)

## 2022-04-24 NOTE — Progress Notes (Signed)
[facility] [address] [place] [address] Dept: [phone] Dept Fax: [phone]  Chronic Care Office Visit  Subjective:    Patient ID: Alyssa Weaver, female    DOB: 12/22/1950, 70 y.o..   MRN: [id number]  Chief Complaint  Patient presents with   Follow-up    4 month f/u.  No concerns.   Flu shot today.     History of Present Illness:  Patient is in today for reassessment of chronic medical issues. She is accompanied by a medical interpreter (Alyssa Weaver).   Alyssa Weaver has a history of Type 2 diabetes. She is managed on Lantus 22 units daily, metformin 1000 mg bid, and dulaglutide (Trulicity) 3 mg weekly injections weekly. She has started to see Alyssa Weaver. It appears form his notes that endocrinology had never been asked to see Alyssa Weaver formally regarding her diabetes (only treating her osteoporosis and thyroid nodules). She continues to note episodes, esp. at night, of hypoglycemia, with her blood sugars in the 60s.    She also has a history of hyperlipidemia and is managed on atorvastatin 80 mg daily.   Alyssa Weaver has a history of hyperthyroidism. She has apparently had a mildly low TSH with normal T3 and negative thyrotropin antibodies. She had been managed on PTU. Alyssa Weaver has stopped this and is reassessing her thyroid studies off of her meds. Additionally, her prior FNA was normal.   Alyssa Weaver has a history of osteoporosis. She is on ibandronate 150 mg once a month and daily calcium + Vit. D. She was initially started on a bisphosphonate by Alyssa Weaver in Nov. 2020.  Alyssa Weaver smokes about 1/3 ppd of cigarettes. She continues to smoke. She finds her greatest urge to smoke occurs after mealtime.  Past Medical History: Patient Active Problem List   Diagnosis Date Noted   Glaucoma 03/14/2021   Gastroesophageal reflux disease 07/17/2019   Osteoporosis 05/20/2019   Multinodular thyroid 05/20/2019    Tobacco use disorder 01/07/2018   Seasonal allergic rhinitis due to pollen 01/07/2018   Environmental allergies    Type 2 diabetes mellitus with hyperglycemia, with long-term current use of insulin ([facility]) 01/04/2016   Hyperlipidemia 01/04/2016   Past Surgical History:  Procedure Laterality Date   APPENDECTOMY  1978   COLONOSCOPY     in [place]   TUBAL LIGATION  1985   Family History  Problem Relation Age of Onset   Asthma Brother    Thyroid disease Neg Hx    Osteoporosis Neg Hx    Colon cancer Neg Hx    Colon polyps Neg Hx    Stomach cancer Neg Hx    Esophageal cancer Neg Hx    Outpatient Medications Prior to Visit  Medication Sig Dispense Refill   acetaminophen (TYLENOL 8 HOUR) 650 MG CR tablet Take 1 tablet (650 mg total) by mouth every 8 (eight) hours as needed for pain. 20 tablet 0   atorvastatin (LIPITOR) 80 MG tablet Take 1 tablet by mouth once daily 90 tablet 0   Blood Glucose Monitoring Suppl (AGAMATRIX PRESTO) w/Device KIT Twice daily sugar checks before meals 1 kit 0   Calcium Citrate-Vitamin D (CITRACAL/VITAMIN D) 250-200 MG-UNIT TABS 2 tabs by mouth twice daily 120 each    cetirizine (ZYRTEC) 10 MG tablet Take 1 tablet (10 mg total) by mouth daily. 90 tablet 3   cyclobenzaprine (FLEXERIL) 5 MG tablet Take 1 tablet (5 mg total) by mouth 3 (three) times daily as needed   for muscle spasms. 30 tablet 1   Dulaglutide (TRULICITY) 3 MG/0.5ML SOPN INJECT 3 MG (0.5 ML) ONCE A WEEK AS DIRECTED 12 mL 0   Ergocalciferol (VITAMIN D2) 50 MCG (2000 UT) TABS Take by mouth daily.     glucose 4 GM chewable tablet Chew 1 tablet (4 g total) by mouth as needed for low blood sugar. 50 tablet 12   glucose blood (ONE TOUCH ULTRA TEST) test strip Use to check CBGs qam fasting, 2 hrs postprandial, & if having hyper or hypoglycemic sxs. Dx E11.65, Z79.4 400 each 4   hydrOXYzine (VISTARIL) 25 MG capsule Take 1 capsule (25 mg total) by mouth at bedtime as needed (for sleep). 30 capsule 0   ibandronate  (BONIVA) 150 MG tablet Take 1 tablet (150 mg total) by mouth every 30 (thirty) days. Take in the morning with a full glass of water, on an empty stomach, and do not take anything else by mouth or lie down for the next 30 min. 3 tablet 3   insulin glargine (LANTUS SOLOSTAR) 100 UNIT/ML Solostar Pen Inject 22 Units into the skin every morning. 15 mL 6   latanoprost (XALATAN) 0.005 % ophthalmic solution INSTILL 1 DROP INTO EACH EYE ONCE DAILY IN THE EVENING     metFORMIN (GLUCOPHAGE) 1000 MG tablet Take 1 tablet (1,000 mg total) by mouth 2 (two) times daily with a meal. 180 tablet 3   mometasone (NASONEX) 50 MCG/ACT nasal spray 2 sprays each nostril daily 17 g 5   Olopatadine HCl (PATADAY) 0.2 % SOLN Apply 1 drop to eye 2 (two) times daily. 2.5 mL 2   pantoprazole (PROTONIX) 20 MG tablet TAKE 1 TABLET BY MOUTH ONCE DAILY AS NEEDED FOR HEARTBURN 90 tablet 3   propylthiouracil (PTU) 50 MG tablet Take 1 tablet by mouth once daily 90 tablet 0   doxycycline (VIBRA-TABS) 100 MG tablet Take 1 tablet (100 mg total) by mouth 2 (two) times daily. 20 tablet 0   No facility-administered medications prior to visit.   No Known Allergies    Objective:   Today's Vitals   04/24/22 1257  BP: 114/62  Pulse: 80  Temp: 97.8 F (36.6 C)  TempSrc: Temporal  SpO2: 99%  Weight: 139 lb (63 kg)  Height: 5' 2" (1.575 m)   Body mass index is 25.42 kg/m.   General: Well developed, well nourished. No acute distress. Psych: Alert and oriented. Normal mood and affect.  Health Maintenance Due  Topic Date Due   INFLUENZA VACCINE  02/28/2022   Lab Results Last lipids Lab Results  Component Value Date   CHOL 90 12/21/2021   HDL 42.20 12/21/2021   LDLCALC 28 12/21/2021   TRIG 99.0 12/21/2021   CHOLHDL 2 12/21/2021   Last hemoglobin A1c Lab Results  Component Value Date   HGBA1C 7.9 (H) 12/21/2021   Assessment & Plan:   1. Type 2 diabetes mellitus with hyperglycemia, with long-term current use of insulin  ([facility]) Alyssa Weaver's diabetes remains in marginal control. It is unclear, based on her history, as to how her blood sugars are doing. She is noting regular episodes of mild hypoglycemia at night, despite her A1c being elevated. I was under the impression that this was being managed by her prior endocrinologist, Alyssa Weaver. Having reviewed Alyssa Weaver's recent note, it appears now that this is not the case. At his suggestion, I will request that he engage in assisting to manage her diabetes as well. For now, she will continue    Lantus 22 units daily, metformin 1000 mg bid, and dulaglutide (Trulicity) 3 mg weekly injections weekly.  - Glucose, random - Hemoglobin A1c  2. Multinodular thyroid Apparently, she has had a recent blood draw to reassess her thyroid tests. Dr. Dwyane Dee has stopped her PTU.  3. Osteoporosis, unspecified osteoporosis type, unspecified pathological fracture presence She has been on a bisphosphonate for almost 3 years. Plan to continue ibandronate 150 mg monthly for a total of 5 years.  4. Tobacco use disorder I continue to encourage Ms. Lartigue to stop smoking. We discussed behavioral strategies she might employ.  5. Need for influenza vaccination  - Flu Vaccine QUAD High Dose(Fluad)  Return in about 3 months (around 07/24/2022) for Reassessment.   Haydee Salter, MD

## 2022-04-25 LAB — HEMOGLOBIN A1C
Est. average glucose Bld gHb Est-mCnc: 186 mg/dL
Hgb A1c MFr Bld: 8.1 % — ABNORMAL HIGH (ref 4.8–5.6)

## 2022-04-26 ENCOUNTER — Ambulatory Visit: Payer: Medicare Other | Admitting: Endocrinology

## 2022-04-26 NOTE — Progress Notes (Deleted)
Patient ID: Alyssa Weaver, female   DOB: April 13, 1951, 71 y.o.   MRN: [id number]                                                                                                               Reason for Appointment: Follow-up of thyroid  Chief complaint: Check thyroid    History of Present Illness:   Patient was initially evaluated for endocrinology management because of abnormal TSH in 2018 checked by her PCP She did not have any symptoms at that time of weight loss, palpitations, weakness Did not have any history of neck swelling or difficulty swallowing  Her highest baseline free T4 was only about 1.4 Free T3 has not been higher than 3.2  However she was initially given methimazole in 2020 and subsequently has been on PTU She did not feel any different with starting the PTU Although she has been told to take PTU twice a day she is only taking 1 tablet daily for quite some time  Wt Readings from Last 3 Encounters:  04/24/22 139 lb (63 kg)  02/13/22 139 lb 3.2 oz (63.1 kg)  12/21/21 138 lb (62.6 kg)    Thyroid function tests as follows:     Baseline TSH 0.135 in 2018, was 0.2 in 2019  Lab Results  Component Value Date   FREET4 0.92 04/17/2022   FREET4 0.96 02/09/2022   FREET4 0.72 09/27/2021   T3FREE 2.8 07/19/2018   T3FREE 2.6 03/06/2018   T3FREE 2.5 01/07/2018   TSH 0.36 04/17/2022   TSH 0.46 02/09/2022   TSH 0.42 09/27/2021   Lab Results  Component Value Date   FREET4 0.92 04/17/2022   FREET4 0.96 02/09/2022   FREET4 0.72 09/27/2021   FREET4 0.82 05/23/2021   FREET4 0.82 01/25/2021   FREET4 0.98 10/25/2020   FREET4 1.30 05/03/2020   FREET4 0.98 07/01/2019   FREET4 1.28 02/18/2019    Lab Results  Component Value Date   THYROTRECAB <1.10 02/09/2022   THYROID nodules:  She had an incidental thyroid nodule discovered on a CT scan and subsequently was sent for ultrasound and thyroid biopsies  On: 01/23/2018 Heterogeneous complex multinodular  thyroid.   Recommend biopsy of the 2 most suspicious nodules, nodule 7 and 8  Needle aspiration showed Bethesda category 2 cytology and no further follow-up recommended     Allergies as of 04/26/2022   No Known Allergies      Medication List        Accurate as of April 26, 2022  1:15 PM. If you have any questions, ask your nurse or doctor.          acetaminophen 650 MG CR tablet Commonly known as: Tylenol 8 Hour Take 1 tablet (650 mg total) by mouth every 8 (eight) hours as needed for pain.   AgaMatrix Presto w/Device Kit Twice daily sugar checks before meals   atorvastatin 80 MG tablet Commonly known as: LIPITOR Take 1 tablet by mouth once daily   Calcium Citrate-Vitamin D 250-200 MG-UNIT Tabs Commonly known  as: Citracal/Vitamin D 2 tabs by mouth twice daily   cetirizine 10 MG tablet Commonly known as: ZYRTEC Take 1 tablet (10 mg total) by mouth daily.   cyclobenzaprine 5 MG tablet Commonly known as: FLEXERIL Take 1 tablet (5 mg total) by mouth 3 (three) times daily as needed for muscle spasms.   glucose 4 GM chewable tablet Chew 1 tablet (4 g total) by mouth as needed for low blood sugar.   glucose blood test strip Commonly known as: ONE TOUCH ULTRA TEST Use to check CBGs qam fasting, 2 hrs postprandial, & if having hyper or hypoglycemic sxs. Dx E11.65, Z79.4   hydrOXYzine 25 MG capsule Commonly known as: VISTARIL Take 1 capsule (25 mg total) by mouth at bedtime as needed (for sleep).   ibandronate 150 MG tablet Commonly known as: BONIVA Take 1 tablet (150 mg total) by mouth every 30 (thirty) days. Take in the morning with a full glass of water, on an empty stomach, and do not take anything else by mouth or lie down for the next 30 min.   Lantus SoloStar 100 UNIT/ML Solostar Pen Generic drug: insulin glargine Inject 22 Units into the skin every morning.   latanoprost 0.005 % ophthalmic solution Commonly known as: XALATAN INSTILL 1 DROP INTO EACH  EYE ONCE DAILY IN THE EVENING   metFORMIN 1000 MG tablet Commonly known as: GLUCOPHAGE Take 1 tablet (1,000 mg total) by mouth 2 (two) times daily with a meal.   mometasone 50 MCG/ACT nasal spray Commonly known as: Nasonex 2 sprays each nostril daily   Olopatadine HCl 0.2 % Soln Commonly known as: Pataday Apply 1 drop to eye 2 (two) times daily.   pantoprazole 20 MG tablet Commonly known as: PROTONIX TAKE 1 TABLET BY MOUTH ONCE DAILY AS NEEDED FOR HEARTBURN   Trulicity 3 IP/7.7PZ Sopn Generic drug: Dulaglutide INJECT 3 MG (0.5 ML) ONCE A WEEK AS DIRECTED   Vitamin D2 50 MCG (2000 UT) Tabs Take by mouth daily.            Past Medical History:  Diagnosis Date   Allergy    Diabetes mellitus without complication ([place])    GERD (gastroesophageal reflux disease)    Hyperlipidemia    Hypertension    Osteoporosis     Past Surgical History:  Procedure Laterality Date   APPENDECTOMY  1978   COLONOSCOPY     in [place]    Family History  Problem Relation Age of Onset   Asthma Brother    Thyroid disease Neg Hx    Osteoporosis Neg Hx    Colon cancer Neg Hx    Colon polyps Neg Hx    Stomach cancer Neg Hx    Esophageal cancer Neg Hx     Social History:  reports that she has been smoking cigarettes. She has a 10.75 pack-year smoking history. She has never used smokeless tobacco. She reports that she does not drink alcohol and does not use drugs.  Allergies: No Known Allergies   Review of Systems  Constitutional:  Negative for weight loss.  Cardiovascular:  Negative for palpitations.   OSTEOPOROSIS: She was started on Boniva after her screening bone density in 05/2019 showed osteoporosis at the spine with T score -2.5 She takes this regularly without side effect Last vitamin D level also normal  Bone density results: T-score AP Spine L1-L4 10/27/2021 69.4 -1.9 0.947 g/cm2 * AP Spine  L1-L4      05/13/2019  66.9         -2.5    0.882 g/cm2    DualFemur Neck Left  10/27/2021    69.4         -1.3    0.860 g/cm2 DualFemur Neck Left  05/13/2019    66.9         -1.5    0.828 g/cm2   DualFemur Total Mean 10/27/2021    69.4         -0.3    0.970 g/cm2 DualFemur Total Mean 05/13/2019    66.9         -0.4    0.961 g/cm2  DIABETES: She is on basal insulin and Trulicity Lab blood sugars are consistently over 200  Lab Results  Component Value Date   HGBA1C 8.1 (H) 04/24/2022   HGBA1C 7.9 (H) 12/21/2021   HGBA1C 8.0 (H) 09/20/2021   Lab Results  Component Value Date   MICROALBUR 1.7 12/21/2021   LDLCALC 28 12/21/2021   CREATININE 0.69 12/21/2021      Examination:   There were no vitals taken for this visit.   General Appearance:  well-built and nourished, pleasant, not anxious or hyperkinetic.         Eyes: No abnormal prominence or stare present.  No swelling of the eyelids   Neck: The thyroid is not clearly palpable on the left There appears to be approximately 1.5-2 cm nodule on the right medial lobe felt only on swallowing There is no lymphadenopathy in the neck .           Heart: normal S1 and S2, no murmurs .           Extremities: hands are not unusually warm.  No ankle edema.  Neurological:  No fine tremors are present. Deep tendon reflexes at biceps are normal.    Assessment/Plan:  Multinodular goiter  She has a history of an autonomously functioning thyroid noted to multinodular goiter She has not had any true hyperthyroidism with no increase in free T4 or free T3 documented Also her TSH has never been suppressed below 0.1 indicating subclinical hyperthyroidism  With her thyrotropin receptor antibody being undetectable she does not have Graves' disease either  Also her nodular goiter has been evaluated previously with ultrasounds and thyroid needle aspiration which indicated benign nodules  Recommendations:  Since she is only on the minimal dose of 50 mg PTU she can stop this Follow-up thyroid  levels in about 2 months and if completely normal follow annually Follow thyroid enlargement clinically  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  Her spine had improved bone density in March and now is showing osteopenia only  Recommended that she will complete a 5-year course of Boniva and then likely stop for at least a year Continue calcium and vitamin D supplement  DIABETES: Her diabetes is poorly controlled with A1c consistently around 8% She likely has consistently high postprandial readings with not getting any mealtime insulin She will continue to follow-up with her PCP but recommended that her husband will discuss with her PCP if endocrinology consultation is needed for diabetes management  Elayne Snare 04/26/2022, 1:15 PM    Note: This office note was prepared with Dragon voice recognition system technology. Any transcriptional errors that result from this process are unintentional.

## 2022-05-03 NOTE — Progress Notes (Signed)
Patient ID: Alyssa Weaver, female   DOB: 1950/10/22, 71 y.o.   MRN: [id number]                                                                                                               Reason for Appointment: Follow-up of thyroid  Chief complaint: Check thyroid    History of Present Illness:   Patient was initially evaluated for endocrinology management because of abnormal TSH in 2018 checked by her PCP She did not have any symptoms at that time of weight loss, palpitations, weakness Did not have any history of neck swelling or difficulty swallowing  Her highest baseline free T4 was only about 1.4 Free T3 has not been higher than 3.2  However she was initially given methimazole in 2020 and subsequently had been on PTU She did not feel any different with starting the PTU On her initial visit in 7/23 she was taking only 50 mg PTU once a day  With stopping her PTU her thyroid functions are nearly the same with low normal TSH again  Wt Readings from Last 3 Encounters:  05/04/22 138 lb 3.2 oz (62.7 kg)  04/24/22 139 lb (63 kg)  02/13/22 139 lb 3.2 oz (63.1 kg)    Thyroid function tests as follows:     Baseline TSH 0.135 in 2018, was 0.2 in 2019  Lab Results  Component Value Date   FREET4 0.92 04/17/2022   FREET4 0.96 02/09/2022   FREET4 0.72 09/27/2021   T3FREE 2.8 07/19/2018   T3FREE 2.6 03/06/2018   T3FREE 2.5 01/07/2018   TSH 0.36 04/17/2022   TSH 0.46 02/09/2022   TSH 0.42 09/27/2021   Lab Results  Component Value Date   FREET4 0.92 04/17/2022   FREET4 0.96 02/09/2022   FREET4 0.72 09/27/2021   FREET4 0.82 05/23/2021   FREET4 0.82 01/25/2021   FREET4 0.98 10/25/2020   FREET4 1.30 05/03/2020   FREET4 0.98 07/01/2019   FREET4 1.28 02/18/2019    Lab Results  Component Value Date   THYROTRECAB <1.10 02/09/2022   THYROID nodules:  She had an incidental thyroid nodule discovered on a CT scan and subsequently was sent for ultrasound and thyroid  biopsies  On: 01/23/2018 Heterogeneous complex multinodular thyroid.   Recommend biopsy of the 2 most suspicious nodules, nodule 7 and 8  Needle aspiration showed Bethesda category 2 cytology and no further follow-up recommended     Allergies as of 05/04/2022   No Known Allergies      Medication List        Accurate as of May 04, 2022  8:19 AM. If you have any questions, ask your nurse or doctor.          acetaminophen 650 MG CR tablet Commonly known as: Tylenol 8 Hour Take 1 tablet (650 mg total) by mouth every 8 (eight) hours as needed for pain.   AgaMatrix Presto w/Device Kit Twice daily sugar checks before meals   atorvastatin 80 MG tablet Commonly known as: LIPITOR Take 1 tablet by  mouth once daily   Calcium Citrate-Vitamin D 250-200 MG-UNIT Tabs Commonly known as: Citracal/Vitamin D 2 tabs by mouth twice daily   cetirizine 10 MG tablet Commonly known as: ZYRTEC Take 1 tablet (10 mg total) by mouth daily.   cyclobenzaprine 5 MG tablet Commonly known as: FLEXERIL Take 1 tablet (5 mg total) by mouth 3 (three) times daily as needed for muscle spasms.   glucose 4 GM chewable tablet Chew 1 tablet (4 g total) by mouth as needed for low blood sugar.   glucose blood test strip Commonly known as: ONE TOUCH ULTRA TEST Use to check CBGs qam fasting, 2 hrs postprandial, & if having hyper or hypoglycemic sxs. Dx E11.65, Z79.4   hydrOXYzine 25 MG capsule Commonly known as: VISTARIL Take 1 capsule (25 mg total) by mouth at bedtime as needed (for sleep).   ibandronate 150 MG tablet Commonly known as: BONIVA Take 1 tablet (150 mg total) by mouth every 30 (thirty) days. Take in the morning with a full glass of water, on an empty stomach, and do not take anything else by mouth or lie down for the next 30 min.   Lantus SoloStar 100 UNIT/ML Solostar Pen Generic drug: insulin glargine Inject 22 Units into the skin every morning.   latanoprost 0.005 % ophthalmic  solution Commonly known as: XALATAN INSTILL 1 DROP INTO EACH EYE ONCE DAILY IN THE EVENING   metFORMIN 1000 MG tablet Commonly known as: GLUCOPHAGE Take 1 tablet (1,000 mg total) by mouth 2 (two) times daily with a meal.   mometasone 50 MCG/ACT nasal spray Commonly known as: Nasonex 2 sprays each nostril daily   Olopatadine HCl 0.2 % Soln Commonly known as: Pataday Apply 1 drop to eye 2 (two) times daily.   pantoprazole 20 MG tablet Commonly known as: PROTONIX TAKE 1 TABLET BY MOUTH ONCE DAILY AS NEEDED FOR HEARTBURN   Trulicity 3 MG/0.5ML Sopn Generic drug: Dulaglutide INJECT 3 MG (0.5 ML) ONCE A WEEK AS DIRECTED   Vitamin D2 50 MCG (2000 UT) Tabs Take by mouth daily.            Past Medical History:  Diagnosis Date   Allergy    Diabetes mellitus without complication ([facility])    GERD (gastroesophageal reflux disease)    Hyperlipidemia    Hypertension    Osteoporosis     Past Surgical History:  Procedure Laterality Date   APPENDECTOMY  1978   COLONOSCOPY     in [place]   TUBAL LIGATION  1985    Family History  Problem Relation Age of Onset   Asthma Brother    Thyroid disease Neg Hx    Osteoporosis Neg Hx    Colon cancer Neg Hx    Colon polyps Neg Hx    Stomach cancer Neg Hx    Esophageal cancer Neg Hx     Social History:  reports that she has been smoking cigarettes. She has a 10.75 pack-year smoking history. She has never used smokeless tobacco. She reports that she does not drink alcohol and does not use drugs.  Allergies: No Known Allergies   Review of Systems   DIABETES type II: She is on basal insulin using Lantus 22 units in a.m., metformin 1 g twice daily and Trulicity 3 mg weekly  Her PCP has requested help with managing her diabetes  Her A1c appears to be consistently around 8% Difficult to know what her blood sugars are as she is likely checking infrequently and mostly in  the morning She thinks she is not having high readings at lunch and  dinner and does not check after dinner Also once or twice a month she may feel hypoglycemic symptoms at 1 or 2 AM treated with simple sugars Lab glucose after lunch however was 187 She has never been on any mealtime insulin  Unclear what meter she is using, may be using One Touch ultra  Blood sugar readings by recall:  PRE-MEAL Fasting Lunch Dinner Bedtime Overall  Glucose range: 160 120 98    Mean/median:        POST-MEAL PC Breakfast PC Lunch PC Dinner  Glucose range:   ?  Mean/median:        Lab Results  Component Value Date   HGBA1C 8.1 (H) 04/24/2022   HGBA1C 7.9 (H) 12/21/2021   HGBA1C 8.0 (H) 09/20/2021   Lab Results  Component Value Date   MICROALBUR 1.7 12/21/2021   LDLCALC 28 12/21/2021   CREATININE 0.69 12/21/2021    OSTEOPOROSIS: She was started on Boniva after her screening bone density in 05/2019 showed osteoporosis at the spine with T score -2.5 She takes this regularly without side effect Last vitamin D level also normal  Bone density results: T-score AP Spine L1-L4 10/27/2021 69.4 -1.9 0.947 g/cm2 * AP Spine  L1-L4      05/13/2019    66.9         -2.5    0.882 g/cm2   DualFemur Neck Left  10/27/2021    69.4         -1.3    0.860 g/cm2 DualFemur Neck Left  05/13/2019    66.9         -1.5    0.828 g/cm2   DualFemur Total Mean 10/27/2021    69.4         -0.3    0.970 g/cm2 DualFemur Total Mean 05/13/2019    66.9         -0.4    0.961 g/cm2  Examination:   BP 128/70   Pulse 87   Ht 5\' 2"  (1.575 m)   Wt 138 lb 3.2 oz (62.7 kg)   SpO2 98%   BMI 25.28 kg/m    Exam not indicated   Assessment/Plan:  Multinodular goiter  She has a history of an autonomously functioning thyroid noted to multinodular goiter She has not had any true hyperthyroidism with no increase in free T4 or free T3 documented  With her thyrotropin receptor antibody being undetectable she does not have Graves' disease   Now with stopping her PTU her TSH is still normal  without increasing free T4 Again discussed that taking minimal dose of 50 mg PTU was not affecting her thyroid function and needs to continue leaving it off   DIABETES: Her diabetes is poorly controlled with A1c consistently around 8% She does not check readings after meals and discussed that her lab glucose was 187 May likely need mealtime insulin at least at dinnertime but needs to further start checking readings 2 hours after eating to help identify blood sugar patterns Since she has occasional hypoglycemia with Lantus will reduce the dose to 20 units  Currently does not appear to have coverage from her insurance for [place] She will be also started on a Dexcom sensor and explained the benefits of using CGM Also referral made for diabetes educator to help start the sensor and guide in day-to-day management  Patient Instructions  Check blood sugars on waking up 3-4 days a  week  Also check blood sugars about 2 hours after meals and do this after different meals by rotation  Recommended blood sugar levels on waking up are 90-130 and about 2 hours after meal is 130-160  Please bring your blood sugar monitor to each visit, thank you  Take 20 units Lantus   Reather Littler 05/04/2022, 8:19 AM    Note: This office note was prepared with Dragon voice recognition system technology. Any transcriptional errors that result from this process are unintentional.

## 2022-05-04 ENCOUNTER — Encounter: Payer: Self-pay | Admitting: Endocrinology

## 2022-05-04 ENCOUNTER — Ambulatory Visit: Payer: Medicare Other | Admitting: Endocrinology

## 2022-05-04 VITALS — BP 128/70 | HR 87 | Ht 62.0 in | Wt 138.2 lb

## 2022-05-04 DIAGNOSIS — E042 Nontoxic multinodular goiter: Secondary | ICD-10-CM

## 2022-05-04 DIAGNOSIS — Z794 Long term (current) use of insulin: Secondary | ICD-10-CM

## 2022-05-04 DIAGNOSIS — E1165 Type 2 diabetes mellitus with hyperglycemia: Secondary | ICD-10-CM

## 2022-05-04 MED ORDER — DEXCOM G7 SENSOR MISC: 1.0000 | 3 refills | Status: DC

## 2022-05-04 MED ORDER — DEXCOM G7 RECEIVER DEVI: 0 refills | Status: DC

## 2022-05-04 NOTE — Patient Instructions (Addendum)
Check blood sugars on waking up 3-4 days a week  Also check blood sugars about 2 hours after meals and do this after different meals by rotation  Recommended blood sugar levels on waking up are 90-130 and about 2 hours after meal is 130-160  Please bring your blood sugar monitor to each visit, thank you  Take 20 units Lantus

## 2022-05-15 ENCOUNTER — Other Ambulatory Visit: Payer: Self-pay | Admitting: Family Medicine

## 2022-05-15 DIAGNOSIS — E1165 Type 2 diabetes mellitus with hyperglycemia: Secondary | ICD-10-CM

## 2022-05-23 ENCOUNTER — Other Ambulatory Visit: Payer: Self-pay

## 2022-05-23 DIAGNOSIS — E1165 Type 2 diabetes mellitus with hyperglycemia: Secondary | ICD-10-CM

## 2022-05-23 MED ORDER — GLUCOSE BLOOD VI STRP: ORAL_STRIP | 4 refills | Status: DC

## 2022-06-19 LAB — HM DIABETES EYE EXAM

## 2022-06-20 ENCOUNTER — Encounter: Payer: Self-pay | Admitting: Family Medicine

## 2022-06-20 DIAGNOSIS — H25013 Cortical age-related cataract, bilateral: Secondary | ICD-10-CM | POA: Insufficient documentation

## 2022-06-23 ENCOUNTER — Other Ambulatory Visit: Payer: Self-pay | Admitting: Family Medicine

## 2022-06-23 DIAGNOSIS — E782 Mixed hyperlipidemia: Secondary | ICD-10-CM

## 2022-07-18 LAB — HM DIABETES EYE EXAM

## 2022-07-19 ENCOUNTER — Encounter: Payer: Self-pay | Admitting: Family Medicine

## 2022-07-25 ENCOUNTER — Encounter: Payer: Self-pay | Admitting: Family Medicine

## 2022-07-25 ENCOUNTER — Ambulatory Visit (INDEPENDENT_AMBULATORY_CARE_PROVIDER_SITE_OTHER): Payer: Medicare Other | Admitting: Family Medicine

## 2022-07-25 VITALS — BP 118/62 | HR 82 | Temp 97.1°F | Ht 60.0 in | Wt 141.4 lb

## 2022-07-25 DIAGNOSIS — L918 Other hypertrophic disorders of the skin: Secondary | ICD-10-CM

## 2022-07-25 DIAGNOSIS — Z794 Long term (current) use of insulin: Secondary | ICD-10-CM | POA: Diagnosis not present

## 2022-07-25 DIAGNOSIS — E1165 Type 2 diabetes mellitus with hyperglycemia: Secondary | ICD-10-CM

## 2022-07-25 DIAGNOSIS — E782 Mixed hyperlipidemia: Secondary | ICD-10-CM | POA: Diagnosis not present

## 2022-07-25 LAB — HEMOGLOBIN A1C: Hgb A1c MFr Bld: 8.1 % — ABNORMAL HIGH (ref 4.6–6.5)

## 2022-07-25 LAB — GLUCOSE, RANDOM: Glucose, Bld: 289 mg/dL — ABNORMAL HIGH (ref 70–99)

## 2022-07-25 MED ORDER — LANTUS SOLOSTAR 100 UNIT/ML ~~LOC~~ SOPN: 20.0000 [IU] | PEN_INJECTOR | SUBCUTANEOUS | 6 refills | Status: DC

## 2022-07-25 NOTE — Progress Notes (Signed)
[facility] [address] [place] [address] Dept: [phone] Dept Fax: [phone]  Chronic Care Office Visit  Subjective:    Patient ID: Alyssa Weaver, female    DOB: Feb 10, 1951, 71 y.o..   MRN: [id number]  Chief Complaint  Patient presents with   Follow-up    3 month f/u.  No concerns.      History of Present Illness:  Patient is in today for reassessment of chronic medical issues. She is here today with her husband, who is acting as interpreter at her insistence.  Ms. Postel has a history of Type 2 diabetes. She is managed on Lantus 20 units daily, metformin 1000 mg bid, and dulaglutide (Trulicity) 3 mg weekly injections weekly. She is primarily managed by Dr. Dwyane Dee.     She also has a history of hyperlipidemia and is managed on atorvastatin 80 mg daily.    Ms. Quintela notes an issue with skin tags around the neck. These frequently get caught on her jewelry.   Past Medical History: Patient Active Problem List   Diagnosis Date Noted   Cataract cortical, senile, bilateral 06/20/2022   Primary open angle glaucoma (POAG) of both eyes, moderate stage 03/14/2021   Gastroesophageal reflux disease 07/17/2019   Osteoporosis 05/20/2019   Multinodular thyroid 05/20/2019   Tobacco use disorder 01/07/2018   Seasonal allergic rhinitis due to pollen 01/07/2018   Environmental allergies    Type 2 diabetes mellitus with hyperglycemia, with long-term current use of insulin ([place]) 01/04/2016   Hyperlipidemia 01/04/2016   Past Surgical History:  Procedure Laterality Date   APPENDECTOMY  1978   COLONOSCOPY     in [place]   Family History  Problem Relation Age of Onset   Asthma Brother    Thyroid disease Neg Hx    Osteoporosis Neg Hx    Colon cancer Neg Hx    Colon polyps Neg Hx    Stomach cancer Neg Hx    Esophageal cancer Neg Hx    Outpatient Medications Prior to Visit  Medication Sig  Dispense Refill   acetaminophen (TYLENOL 8 HOUR) 650 MG CR tablet Take 1 tablet (650 mg total) by mouth every 8 (eight) hours as needed for pain. 20 tablet 0   atorvastatin (LIPITOR) 80 MG tablet Take 1 tablet by mouth once daily 90 tablet 3   Blood Glucose Monitoring Suppl (AGAMATRIX PRESTO) w/Device KIT Twice daily sugar checks before meals 1 kit 0   Calcium Citrate-Vitamin D (CITRACAL/VITAMIN D) 250-200 MG-UNIT TABS 2 tabs by mouth twice daily 120 each    cetirizine (ZYRTEC) 10 MG tablet Take 1 tablet (10 mg total) by mouth daily. 90 tablet 3   Continuous Blood Gluc Receiver (DEXCOM G7 RECEIVER) DEVI To display CGM data 1 each 0   Continuous Blood Gluc Sensor (DEXCOM G7 SENSOR) MISC 1 Device by Does not apply route as directed. Change sensor every 10 days 3 each 3   cyclobenzaprine (FLEXERIL) 5 MG tablet Take 1 tablet (5 mg total) by mouth 3 (three) times daily as needed for muscle spasms. 30 tablet 1   dorzolamide (TRUSOPT) 2 % ophthalmic solution 1 drop 2 (two) times daily.     Dulaglutide (TRULICITY) 3 HC/6.2BJ SOPN INJECT 3 MG (0.5 ML) INTO THE SKIN ONCE A WEEK AS DIRECTED 12 mL 0   Ergocalciferol (VITAMIN D2) 50 MCG (2000 UT) TABS Take by mouth daily.     glucose 4 GM chewable tablet Chew  1 tablet (4 g total) by mouth as needed for low blood sugar. 50 tablet 12   glucose blood (ONE TOUCH ULTRA TEST) test strip Use to check CBGs qam fasting, 2 hrs postprandial, & if having hyper or hypoglycemic sxs. Dx E11.65, Z79.4 400 each 4   hydrOXYzine (VISTARIL) 25 MG capsule Take 1 capsule (25 mg total) by mouth at bedtime as needed (for sleep). 30 capsule 0   ibandronate (BONIVA) 150 MG tablet Take 1 tablet (150 mg total) by mouth every 30 (thirty) days. Take in the morning with a full glass of water, on an empty stomach, and do not take anything else by mouth or lie down for the next 30 min. 3 tablet 3   insulin glargine (LANTUS SOLOSTAR) 100 UNIT/ML Solostar Pen Inject 22 Units into the skin every  morning. 15 mL 6   latanoprost (XALATAN) 0.005 % ophthalmic solution INSTILL 1 DROP INTO EACH EYE ONCE DAILY IN THE EVENING     metFORMIN (GLUCOPHAGE) 1000 MG tablet Take 1 tablet (1,000 mg total) by mouth 2 (two) times daily with a meal. 180 tablet 3   mometasone (NASONEX) 50 MCG/ACT nasal spray 2 sprays each nostril daily 17 g 5   Olopatadine HCl (PATADAY) 0.2 % SOLN Apply 1 drop to eye 2 (two) times daily. 2.5 mL 2   pantoprazole (PROTONIX) 20 MG tablet TAKE 1 TABLET BY MOUTH ONCE DAILY AS NEEDED FOR HEARTBURN 90 tablet 3   No facility-administered medications prior to visit.    No Known Allergies    Objective:   Today's Vitals   07/25/22 1351  BP: 118/62  Pulse: 82  Temp: (!) 97.1 F (36.2 C)  TempSrc: Temporal  SpO2: 98%  Weight: 141 lb 6.4 oz (64.1 kg)  Height: 5' (1.524 m)   Body mass index is 27.62 kg/m.   General: Well developed, well nourished. No acute distress. Feet- Skin intact. No sign of maceration between toes. Nails are normal. Dorsalis pedis and posterior   tibial artery pulses are normal. 5.07 monofilament testing normal. Skin: Warm and dry. There are multiples acrochordon aroudn the neck. Some are more flat and   hyperpigmented, possibly SKs. Psych: Alert and oriented. Normal mood and affect.  Health Maintenance Due  Topic Date Due   Medicare Annual Wellness (AWV)  Never done   DTaP/Tdap/Td (1 - Tdap) Never done     Assessment & Plan:   1. Type 2 diabetes mellitus with hyperglycemia, with long-term current use of insulin (HCC) I will reassess the A1c today. Dr. Dwyane Dee reduced her Lantus to 20 units daily because of her issues with hypoglycemia. I am concerned her control may have worsened. However, she is seeing Dr. Dwyane Dee back later in Jan. Foot exam completed today.  - Glucose, random - Hemoglobin A1c - insulin glargine (LANTUS SOLOSTAR) 100 UNIT/ML Solostar Pen; Inject 20 Units into the skin every morning.  Dispense: 15 mL; Refill: 6  2. Mixed  hyperlipidemia Continue atorvastatin 80 mg daily.  3. 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 Some of these would be amenable to cryotherapy, which we do not have available in the clinic. I will refer her ot dermatology.  - Ambulatory referral to Dermatology  Return in about 3 months (around 10/24/2022) for Reassessment.   Haydee Salter, MD

## 2022-07-25 NOTE — Patient Instructions (Signed)
Talk to pharmacy about a tetanus immunization

## 2022-07-28 ENCOUNTER — Other Ambulatory Visit: Payer: Self-pay | Admitting: Family Medicine

## 2022-07-28 DIAGNOSIS — E1165 Type 2 diabetes mellitus with hyperglycemia: Secondary | ICD-10-CM

## 2022-07-28 NOTE — Telephone Encounter (Signed)
LAST APPOINTMENT DATE:  07/25/22  NEXT APPOINTMENT DATE: 10/24/22  MEDICATION:  metFORMIN (GLUCOPHAGE) 1000 MG tablet   Is the patient out of medication? Yes, took last pill this morning  PHARMACY: [facility], [address] [place], [place] [address] Phone: [phone]  Fax: [phone]   Caller states: -Pt didn't realize she would be taking last pill today  - Needs this asap

## 2022-08-07 ENCOUNTER — Other Ambulatory Visit: Payer: Self-pay | Admitting: Family Medicine

## 2022-08-07 ENCOUNTER — Telehealth: Payer: Self-pay | Admitting: Family Medicine

## 2022-08-07 DIAGNOSIS — E1165 Type 2 diabetes mellitus with hyperglycemia: Secondary | ICD-10-CM

## 2022-08-07 MED ORDER — LANTUS SOLOSTAR 100 UNIT/ML ~~LOC~~ SOPN: 20.0000 [IU] | PEN_INJECTOR | SUBCUTANEOUS | 6 refills | Status: DC

## 2022-08-07 MED ORDER — TRULICITY 3 MG/0.5ML ~~LOC~~ SOAJ: SUBCUTANEOUS | 1 refills | Status: DC

## 2022-08-07 NOTE — Telephone Encounter (Signed)
Caller Name: pt Call back phone #: [phone]   MEDICATION(S):  insulin glargine (LANTUS SOLOSTAR) 100 UNIT/ML Solostar Pen [[id number]]   Days of Med Remaining:   Has the patient contacted their pharmacy (YES/NO)? no What did pharmacy advise?   Preferred Pharmacy:  [facility] [address] Phone: [phone]  Fax: [phone]      ~~~Please advise patient/caregiver to allow 2-3 business days to process RX refills.

## 2022-08-07 NOTE — Telephone Encounter (Signed)
Left detailed VM that RX was sent to the pharmacy.  Dm/cma

## 2022-08-22 ENCOUNTER — Telehealth: Payer: Self-pay | Admitting: Family Medicine

## 2022-08-22 NOTE — Telephone Encounter (Signed)
Left message for patient to call back and schedule Medicare Annual Wellness Visit (AWV) either virtually or in office. Left  my Herbie Drape number [phone]   *due 05/31/2018 awvi PER PALMET[facility]  please schedule with Nurse Health Adviser   45 min for awv-i  in office appointments 30 min for awv-s & awv-i phone/virtual appointments

## 2022-08-25 ENCOUNTER — Other Ambulatory Visit (INDEPENDENT_AMBULATORY_CARE_PROVIDER_SITE_OTHER): Payer: Medicare Other

## 2022-08-25 DIAGNOSIS — Z794 Long term (current) use of insulin: Secondary | ICD-10-CM

## 2022-08-25 DIAGNOSIS — E1165 Type 2 diabetes mellitus with hyperglycemia: Secondary | ICD-10-CM | POA: Diagnosis not present

## 2022-08-25 LAB — BASIC METABOLIC PANEL
BUN: 9 mg/dL (ref 6–23)
CO2: 24 mEq/L (ref 19–32)
Calcium: 9.2 mg/dL (ref 8.4–10.5)
Chloride: 105 mEq/L (ref 96–112)
Creatinine, Ser: 0.59 mg/dL (ref 0.40–1.20)
GFR: 91.43 mL/min (ref >60.00)
Glucose, Bld: 113 mg/dL — ABNORMAL HIGH (ref 70–99)
Potassium: 3.9 mEq/L (ref 3.5–5.1)
Sodium: 140 mEq/L (ref 135–145)

## 2022-08-25 LAB — HEMOGLOBIN A1C: Hgb A1c MFr Bld: 7.8 % — ABNORMAL HIGH (ref 4.6–6.5)

## 2022-08-28 ENCOUNTER — Encounter: Payer: Self-pay | Admitting: Endocrinology

## 2022-08-28 ENCOUNTER — Ambulatory Visit: Payer: Medicare Other | Admitting: Endocrinology

## 2022-08-28 VITALS — BP 120/74 | HR 86 | Ht 60.0 in | Wt 138.1 lb

## 2022-08-28 DIAGNOSIS — E1165 Type 2 diabetes mellitus with hyperglycemia: Secondary | ICD-10-CM | POA: Diagnosis not present

## 2022-08-28 DIAGNOSIS — E042 Nontoxic multinodular goiter: Secondary | ICD-10-CM | POA: Diagnosis not present

## 2022-08-28 DIAGNOSIS — Z794 Long term (current) use of insulin: Secondary | ICD-10-CM | POA: Diagnosis not present

## 2022-08-28 NOTE — Progress Notes (Signed)
Patient ID: Alyssa Weaver, female   DOB: 1951/02/21, 72 y.o.   MRN: [id number]                                                                                                               Reason for Appointment: Endocrinology follow-up  Chief complaint: None    History of Present Illness:   History of goiter: Following is a copy of the previous note  Patient was initially evaluated for endocrinology management because of abnormal TSH in 2018 checked by her PCP She did not have any symptoms at that time of weight loss, palpitations, weakness Did not have any history of neck swelling or difficulty swallowing  Her highest baseline free T4 was only about 1.4 Free T3 has not been higher than 3.2  However she was initially given methimazole in 2020 and subsequently had been on PTU She did not feel any different with starting the PTU On her initial visit in 7/23 she was taking only 50 mg PTU once a day  With stopping her PTU her thyroid functions were nearly the same with low normal TSH   Wt Readings from Last 3 Encounters:  08/28/22 138 lb 2 oz (62.7 kg)  07/25/22 141 lb 6.4 oz (64.1 kg)  05/04/22 138 lb 3.2 oz (62.7 kg)    Thyroid function tests as follows:     Baseline TSH 0.135 in 2018, was 0.2 in 2019  Lab Results  Component Value Date   FREET4 0.92 04/17/2022   FREET4 0.96 02/09/2022   FREET4 0.72 09/27/2021   T3FREE 2.8 07/19/2018   T3FREE 2.6 03/06/2018   T3FREE 2.5 01/07/2018   TSH 0.36 04/17/2022   TSH 0.46 02/09/2022   TSH 0.42 09/27/2021   Lab Results  Component Value Date   FREET4 0.92 04/17/2022   FREET4 0.96 02/09/2022   FREET4 0.72 09/27/2021   FREET4 0.82 05/23/2021   FREET4 0.82 01/25/2021   FREET4 0.98 10/25/2020   FREET4 1.30 05/03/2020   FREET4 0.98 07/01/2019   FREET4 1.28 02/18/2019    Lab Results  Component Value Date   THYROTRECAB <1.10 02/09/2022   THYROID nodules:  She had an incidental thyroid nodule discovered on a CT scan  and subsequently was sent for ultrasound and thyroid biopsies  On: 01/23/2018 Heterogeneous complex multinodular thyroid.   Recommend biopsy of the 2 most suspicious nodules, nodule 7 and 8  Needle aspiration showed Bethesda category 2 cytology and no further follow-up recommended     DIABETES: See review of systems  Allergies as of 08/28/2022   No Known Allergies      Medication List        Accurate as of August 28, 2022  8:44 PM. If you have any questions, ask your nurse or doctor.          acetaminophen 650 MG CR tablet Commonly known as: Tylenol 8 Hour Take 1 tablet (650 mg total) by mouth every 8 (eight) hours as needed for pain.   AgaMatrix Presto w/Device Kit Twice daily  sugar checks before meals   atorvastatin 80 MG tablet Commonly known as: LIPITOR Take 1 tablet by mouth once daily   Calcium Citrate-Vitamin D 250-200 MG-UNIT Tabs Commonly known as: Citracal/Vitamin D 2 tabs by mouth twice daily   cetirizine 10 MG tablet Commonly known as: ZYRTEC Take 1 tablet (10 mg total) by mouth daily.   cyclobenzaprine 5 MG tablet Commonly known as: FLEXERIL Take 1 tablet (5 mg total) by mouth 3 (three) times daily as needed for muscle spasms.   Dexcom G7 Receiver Devi To display CGM data   Dexcom G7 Sensor Misc 1 Device by Does not apply route as directed. Change sensor every 10 days   dorzolamide 2 % ophthalmic solution Commonly known as: TRUSOPT 1 drop 2 (two) times daily.   glucose 4 GM chewable tablet Chew 1 tablet (4 g total) by mouth as needed for low blood sugar.   glucose blood test strip Commonly known as: ONE TOUCH ULTRA TEST Use to check CBGs qam fasting, 2 hrs postprandial, & if having hyper or hypoglycemic sxs. Dx E11.65, Z79.4   hydrOXYzine 25 MG capsule Commonly known as: VISTARIL Take 1 capsule (25 mg total) by mouth at bedtime as needed (for sleep).   ibandronate 150 MG tablet Commonly known as: BONIVA Take 1 tablet (150 mg total)  by mouth every 30 (thirty) days. Take in the morning with a full glass of water, on an empty stomach, and do not take anything else by mouth or lie down for the next 30 min.   Lantus SoloStar 100 UNIT/ML Solostar Pen Generic drug: insulin glargine Inject 20 Units into the skin every morning.   latanoprost 0.005 % ophthalmic solution Commonly known as: XALATAN INSTILL 1 DROP INTO EACH EYE ONCE DAILY IN THE EVENING   metFORMIN 1000 MG tablet Commonly known as: GLUCOPHAGE TAKE 1 TABLET BY MOUTH TWICE DAILY WITH A MEAL   mometasone 50 MCG/ACT nasal spray Commonly known as: Nasonex 2 sprays each nostril daily   Olopatadine HCl 0.2 % Soln Commonly known as: Pataday Apply 1 drop to eye 2 (two) times daily.   pantoprazole 20 MG tablet Commonly known as: PROTONIX TAKE 1 TABLET BY MOUTH ONCE DAILY AS NEEDED FOR HEARTBURN   Trulicity 3 ZO/1.0RU Sopn Generic drug: Dulaglutide INJECT 3 MG (0.5 ML) INTO THE SKIN ONCE A WEEK AS DIRECTED   Vitamin D2 50 MCG (2000 UT) Tabs Take by mouth daily.            Past Medical History:  Diagnosis Date   Allergy    Diabetes mellitus without complication ([place])    GERD (gastroesophageal reflux disease)    Hyperlipidemia    Hypertension    Osteoporosis     Past Surgical History:  Procedure Laterality Date   APPENDECTOMY  1978   COLONOSCOPY     in [place]    Family History  Problem Relation Age of Onset   Asthma Brother    Thyroid disease Neg Hx    Osteoporosis Neg Hx    Colon cancer Neg Hx    Colon polyps Neg Hx    Stomach cancer Neg Hx    Esophageal cancer Neg Hx     Social History:  reports that she has been smoking cigarettes. She has a 10.75 pack-year smoking history. She has never used smokeless tobacco. She reports that she does not drink alcohol and does not use drugs.  Allergies: No Known Allergies   Review of  Systems   DIABETES type II: She is on basal insulin using Lantus 22 units in a.m.,  metformin 1 g twice daily and Trulicity 3 mg weekly   Her A1c slightly better at 7.8 compared to 8.1 Again she forgot to bring her monitor for download and unclear what her blood sugar patterns are or how often she is checking She refuses to consider the Dexcom that was recommended on the last visit Her blood sugars at home appear to be much lower than expected for her A1c However her fasting glucose was 113 in the lab She is reporting about a month some month or so feeling of low blood sugar and reading for about 65 at bedtime requiring a snack Although she was reporting high readings in the mornings she has reportedly better readings at home in the morning Does not do much exercise  She was referred for nutritional counseling on the last visit but she did not make an appointment  Weight history:  Wt Readings from Last 3 Encounters:  08/28/22 138 lb 2 oz (62.7 kg)  07/25/22 141 lb 6.4 oz (64.1 kg)  05/04/22 138 lb 3.2 oz (62.7 kg)    Unclear what meter she is using, may be using One Touch ultra  Blood sugar readings by recall:     PRE-MEAL Fasting Lunch Dinner Bedtime Overall  Glucose range: 110-120     65    Mean/median:              POST-MEAL PC Breakfast PC Lunch PC Dinner  Glucose range:   145    Mean/median:           PRE-MEAL Fasting Lunch Dinner Bedtime Overall  Glucose range: 160 120 98    Mean/median:        POST-MEAL PC Breakfast PC Lunch PC Dinner  Glucose range:   ?  Mean/median:        Lab Results  Component Value Date   HGBA1C 7.8 (H) 08/25/2022   HGBA1C 8.1 (H) 07/25/2022   HGBA1C 8.1 (H) 04/24/2022   Lab Results  Component Value Date   MICROALBUR 1.7 12/21/2021   LDLCALC 28 12/21/2021   CREATININE 0.59 08/25/2022    OSTEOPOROSIS: She was started on Boniva after her screening bone density in 05/2019 showed osteoporosis at the spine with T score -2.5 She takes this regularly without side effect Last vitamin D level also normal  Bone density  results: T-score AP Spine L1-L4 10/27/2021 69.4 -1.9 0.947 g/cm2 * AP Spine  L1-L4      05/13/2019    66.9         -2.5    0.882 g/cm2   DualFemur Neck Left  10/27/2021    69.4         -1.3    0.860 g/cm2 DualFemur Neck Left  05/13/2019    66.9         -1.5    0.828 g/cm2   DualFemur Total Mean 10/27/2021    69.4         -0.3    0.970 g/cm2 DualFemur Total Mean 05/13/2019    66.9         -0.4    0.961 g/cm2    Examination:   BP 120/74   Pulse 86   Ht 5' (1.524 m)   Wt 138 lb 2 oz (62.7 kg)   SpO2 96%   BMI 26.98 kg/m   Firm slightly nodular right thyroid lobe is palpable medially mostly on  swallowing and about twice normal Left side not palpable    Assessment/Plan:   DIABETES type II:   See history of present illness for detailed discussion of current diabetes management, blood sugar patterns and problems identified  Her A1c is 7.8 although slightly better than before However difficult to know what her blood sugars are especially after meals She has been reluctant to use any CGM but does not bring her monitor for download  Discussed needing to check blood sugars more consistently after meals and bring monitor for download Also needs regular walking Again she is not keen on seeing nutritionist for diabetes education   Multinodular goiter  She has a history of an autonomously functioning thyroid noted to multinodular goiter Again asymptomatic and her exam is unchanged  Has been euthyroid without her low-dose PTU regimen and thyroid levels can be checked periodically  Fatigue: She will discuss this with her PCP  Patient Instructions  Take Metformin 1 in am and 1/2 pm   Elayne Snare 08/28/2022, 8:44 PM    Note: This office note was prepared with Dragon voice recognition system technology. Any transcriptional errors that result from this process are unintentional.

## 2022-08-28 NOTE — Progress Notes (Deleted)
Patient ID: Alyssa Weaver, female   DOB: Nov 27, 1950, 72 y.o.   MRN: [id number]                                                                                                               Reason for Appointment: Endocrinology follow-up  Chief complaint: None    History of Present Illness:   History of goiter: Following is a copy of the previous note  Patient was initially evaluated for endocrinology management because of abnormal TSH in 2018 checked by her PCP She did not have any symptoms at that time of weight loss, palpitations, weakness Did not have any history of neck swelling or difficulty swallowing  Her highest baseline free T4 was only about 1.4 Free T3 has not been higher than 3.2  However she was initially given methimazole in 2020 and subsequently had been on PTU She did not feel any different with starting the PTU On her initial visit in 7/23 she was taking only 50 mg PTU once a day  With stopping her PTU her thyroid functions were nearly the same with low normal TSH   Wt Readings from Last 3 Encounters:  08/28/22 138 lb 2 oz (62.7 kg)  07/25/22 141 lb 6.4 oz (64.1 kg)  05/04/22 138 lb 3.2 oz (62.7 kg)    Thyroid function tests as follows:     Baseline TSH 0.135 in 2018, was 0.2 in 2019  Lab Results  Component Value Date   FREET4 0.92 04/17/2022   FREET4 0.96 02/09/2022   FREET4 0.72 09/27/2021   T3FREE 2.8 07/19/2018   T3FREE 2.6 03/06/2018   T3FREE 2.5 01/07/2018   TSH 0.36 04/17/2022   TSH 0.46 02/09/2022   TSH 0.42 09/27/2021   Lab Results  Component Value Date   FREET4 0.92 04/17/2022   FREET4 0.96 02/09/2022   FREET4 0.72 09/27/2021   FREET4 0.82 05/23/2021   FREET4 0.82 01/25/2021   FREET4 0.98 10/25/2020   FREET4 1.30 05/03/2020   FREET4 0.98 07/01/2019   FREET4 1.28 02/18/2019    Lab Results  Component Value Date   THYROTRECAB <1.10 02/09/2022   THYROID nodules:  She had an incidental thyroid nodule discovered on a CT scan  and subsequently was sent for ultrasound and thyroid biopsies  On: 01/23/2018 Heterogeneous complex multinodular thyroid.   Recommend biopsy of the 2 most suspicious nodules, nodule 7 and 8  Needle aspiration showed Bethesda category 2 cytology and no further follow-up recommended     DIABETES: See review of systems  Allergies as of 08/28/2022   No Known Allergies      Medication List        Accurate as of August 28, 2022  8:32 PM. If you have any questions, ask your nurse or doctor.          acetaminophen 650 MG CR tablet Commonly known as: Tylenol 8 Hour Take 1 tablet (650 mg total) by mouth every 8 (eight) hours as needed for pain.   AgaMatrix Presto w/Device Kit Twice daily  sugar checks before meals   atorvastatin 80 MG tablet Commonly known as: LIPITOR Take 1 tablet by mouth once daily   Calcium Citrate-Vitamin D 250-200 MG-UNIT Tabs Commonly known as: Citracal/Vitamin D 2 tabs by mouth twice daily   cetirizine 10 MG tablet Commonly known as: ZYRTEC Take 1 tablet (10 mg total) by mouth daily.   cyclobenzaprine 5 MG tablet Commonly known as: FLEXERIL Take 1 tablet (5 mg total) by mouth 3 (three) times daily as needed for muscle spasms.   Dexcom G7 Receiver Devi To display CGM data   Dexcom G7 Sensor Misc 1 Device by Does not apply route as directed. Change sensor every 10 days   dorzolamide 2 % ophthalmic solution Commonly known as: TRUSOPT 1 drop 2 (two) times daily.   glucose 4 GM chewable tablet Chew 1 tablet (4 g total) by mouth as needed for low blood sugar.   glucose blood test strip Commonly known as: ONE TOUCH ULTRA TEST Use to check CBGs qam fasting, 2 hrs postprandial, & if having hyper or hypoglycemic sxs. Dx E11.65, Z79.4   hydrOXYzine 25 MG capsule Commonly known as: VISTARIL Take 1 capsule (25 mg total) by mouth at bedtime as needed (for sleep).   ibandronate 150 MG tablet Commonly known as: BONIVA Take 1 tablet (150 mg total)  by mouth every 30 (thirty) days. Take in the morning with a full glass of water, on an empty stomach, and do not take anything else by mouth or lie down for the next 30 min.   Lantus SoloStar 100 UNIT/ML Solostar Pen Generic drug: insulin glargine Inject 20 Units into the skin every morning.   latanoprost 0.005 % ophthalmic solution Commonly known as: XALATAN INSTILL 1 DROP INTO EACH EYE ONCE DAILY IN THE EVENING   metFORMIN 1000 MG tablet Commonly known as: GLUCOPHAGE TAKE 1 TABLET BY MOUTH TWICE DAILY WITH A MEAL   mometasone 50 MCG/ACT nasal spray Commonly known as: Nasonex 2 sprays each nostril daily   Olopatadine HCl 0.2 % Soln Commonly known as: Pataday Apply 1 drop to eye 2 (two) times daily.   pantoprazole 20 MG tablet Commonly known as: PROTONIX TAKE 1 TABLET BY MOUTH ONCE DAILY AS NEEDED FOR HEARTBURN   Trulicity 3 MV/7.8IO Sopn Generic drug: Dulaglutide INJECT 3 MG (0.5 ML) INTO THE SKIN ONCE A WEEK AS DIRECTED   Vitamin D2 50 MCG (2000 UT) Tabs Take by mouth daily.            Past Medical History:  Diagnosis Date   Allergy    Diabetes mellitus without complication ([place])    GERD (gastroesophageal reflux disease)    Hyperlipidemia    Hypertension    Osteoporosis     Past Surgical History:  Procedure Laterality Date   APPENDECTOMY  1978   COLONOSCOPY     in [place]    Family History  Problem Relation Age of Onset   Asthma Brother    Thyroid disease Neg Hx    Osteoporosis Neg Hx    Colon cancer Neg Hx    Colon polyps Neg Hx    Stomach cancer Neg Hx    Esophageal cancer Neg Hx     Social History:  reports that she has been smoking cigarettes. She has a 10.75 pack-year smoking history. She has never used smokeless tobacco. She reports that she does not drink alcohol and does not use drugs.  Allergies: No Known Allergies   Review of  Systems   DIABETES type II: She is on basal insulin using Lantus 22 units in a.m.,  metformin 1 g twice daily and Trulicity 3 mg weekly   Her A1c slightly better at 7.8 compared to 8.1 Again she forgot to bring her monitor for download and unclear what her blood sugar patterns are or how often she is checking She refuses to consider the Dexcom that was recommended on the last visit Her blood sugars at home appear to be much lower than expected for her A1c However her fasting glucose was 113 in the lab She is reporting about a month some month or so feeling of low blood sugar and reading for about 65 at bedtime requiring a snack Although she was reporting high readings in the mornings she has reportedly better readings at home in the morning Does not do much exercise  She was referred for nutritional counseling on the last visit but she did not make an appointment  Weight history:  Wt Readings from Last 3 Encounters:  08/28/22 138 lb 2 oz (62.7 kg)  07/25/22 141 lb 6.4 oz (64.1 kg)  05/04/22 138 lb 3.2 oz (62.7 kg)    Unclear what meter she is using, may be using One Touch ultra  Blood sugar readings by recall:     PRE-MEAL Fasting Lunch Dinner Bedtime Overall  Glucose range: 110-120     65    Mean/median:              POST-MEAL PC Breakfast PC Lunch PC Dinner  Glucose range:   145    Mean/median:           PRE-MEAL Fasting Lunch Dinner Bedtime Overall  Glucose range: 160 120 98    Mean/median:        POST-MEAL PC Breakfast PC Lunch PC Dinner  Glucose range:   ?  Mean/median:        Lab Results  Component Value Date   HGBA1C 7.8 (H) 08/25/2022   HGBA1C 8.1 (H) 07/25/2022   HGBA1C 8.1 (H) 04/24/2022   Lab Results  Component Value Date   MICROALBUR 1.7 12/21/2021   LDLCALC 28 12/21/2021   CREATININE 0.59 08/25/2022    OSTEOPOROSIS: She was started on Boniva after her screening bone density in 05/2019 showed osteoporosis at the spine with T score -2.5 She takes this regularly without side effect Last vitamin D level also normal  Bone density  results: T-score AP Spine L1-L4 10/27/2021 69.4 -1.9 0.947 g/cm2 * AP Spine  L1-L4      05/13/2019    66.9         -2.5    0.882 g/cm2   DualFemur Neck Left  10/27/2021    69.4         -1.3    0.860 g/cm2 DualFemur Neck Left  05/13/2019    66.9         -1.5    0.828 g/cm2   DualFemur Total Mean 10/27/2021    69.4         -0.3    0.970 g/cm2 DualFemur Total Mean 05/13/2019    66.9         -0.4    0.961 g/cm2    Examination:   BP 120/74   Pulse 86   Ht 5' (1.524 m)   Wt 138 lb 2 oz (62.7 kg)   SpO2 96%   BMI 26.98 kg/m   Firm slightly nodular right thyroid lobe is palpable medially mostly on  swallowing and about twice normal Left side not palpable    Assessment/Plan:   DIABETES type II:   See history of present illness for detailed discussion of current diabetes management, blood sugar patterns and problems identified  Her A1c is 7.8 although slightly better than before However difficult to know what her blood sugars are especially after meals She has been reluctant to use any CGM but does not bring her monitor for download  Discussed needing to check blood sugars more consistently after meals and bring monitor for download Also needs regular walking Again she is not keen on seeing nutritionist for diabetes education   Multinodular goiter  She has a history of an autonomously functioning thyroid noted to multinodular goiter Again asymptomatic and her exam is unchanged  Has been euthyroid without her low-dose PTU regimen and thyroid levels can be checked periodically  Fatigue: She will discuss this with her PCP  Patient Instructions  Take Metformin 1 in am and 1/2 pm   Elayne Snare 08/28/2022, 8:32 PM    Note: This office note was prepared with Dragon voice recognition system technology. Any transcriptional errors that result from this process are unintentional.

## 2022-08-28 NOTE — Patient Instructions (Signed)
Take Metformin 1 in am and 1/2 pm

## 2022-08-30 ENCOUNTER — Other Ambulatory Visit: Payer: Self-pay

## 2022-08-30 ENCOUNTER — Other Ambulatory Visit: Payer: Self-pay | Admitting: Family Medicine

## 2022-08-30 DIAGNOSIS — M810 Age-related osteoporosis without current pathological fracture: Secondary | ICD-10-CM

## 2022-08-30 DIAGNOSIS — J302 Other seasonal allergic rhinitis: Secondary | ICD-10-CM

## 2022-08-30 MED ORDER — IBANDRONATE SODIUM 150 MG PO TABS: 150.0000 mg | ORAL_TABLET | ORAL | 3 refills | Status: DC

## 2022-09-25 ENCOUNTER — Telehealth: Payer: Self-pay | Admitting: Family Medicine

## 2022-09-25 NOTE — Telephone Encounter (Signed)
Called patient to schedule Medicare Annual Wellness Visit (AWV). Left message for patient to call back and schedule Medicare Annual Wellness Visit (AWV).  Last date of AWV:  due 05/31/2018 awvi PER [facility]  Please schedule an appointment at any time with [facility].  If any questions, please contact me at [phone].  Thank you ,  Barkley Boards AWV direct phone # [phone]

## 2022-10-09 ENCOUNTER — Ambulatory Visit (INDEPENDENT_AMBULATORY_CARE_PROVIDER_SITE_OTHER): Payer: Medicare Other | Admitting: Family Medicine

## 2022-10-09 VITALS — BP 118/66 | HR 86 | Temp 98.1°F | Ht 60.0 in | Wt 140.0 lb

## 2022-10-09 DIAGNOSIS — Z794 Long term (current) use of insulin: Secondary | ICD-10-CM | POA: Diagnosis not present

## 2022-10-09 DIAGNOSIS — E1165 Type 2 diabetes mellitus with hyperglycemia: Secondary | ICD-10-CM | POA: Diagnosis not present

## 2022-10-09 DIAGNOSIS — E782 Mixed hyperlipidemia: Secondary | ICD-10-CM | POA: Diagnosis not present

## 2022-10-09 DIAGNOSIS — F17200 Nicotine dependence, unspecified, uncomplicated: Secondary | ICD-10-CM | POA: Diagnosis not present

## 2022-10-09 MED ORDER — LANTUS SOLOSTAR 100 UNIT/ML ~~LOC~~ SOPN: 22.0000 [IU] | PEN_INJECTOR | Freq: Every day | SUBCUTANEOUS | 6 refills | Status: DC

## 2022-10-09 NOTE — Assessment & Plan Note (Signed)
Lipids have been at goal. Continue atorvastatin 80 mg daily.

## 2022-10-09 NOTE — Assessment & Plan Note (Signed)
I continue to encourage Alyssa Weaver to stop smoking. She notes she is down to 1/3 ppd. We discussed her cutting this further to 1/4 ppd. I will recheck with her at her next visit.

## 2022-10-09 NOTE — Assessment & Plan Note (Signed)
Recently seen by Dr. Dwyane Dee. He increased her Lantus dose. She will continue Lantus 22 units daily, metformin 1,000 mg bid, and dulaglutide (Trulicity) 3 mg weekly.

## 2022-10-09 NOTE — Progress Notes (Signed)
[facility] [address] [place] [address] Dept: [phone] Dept Fax: [phone]  Chronic Care Office Visit  Subjective:    Patient ID: Alyssa Weaver, female    DOB: 1950-10-10, 72 y.o..   MRN: [id number]  Chief Complaint  Patient presents with   Medical Management of Chronic Issues    3 month f/u.  No concerns.      Medical Interpreter: Simmaly  History of Present Illness:  Patient is in today for reassessment of chronic medical issues.  Alyssa Weaver has a history of Type 2 diabetes. She is managed on Lantus 22 units daily, metformin 1,000 mg bid, and dulaglutide (Trulicity) 3 mg injections weekly. She is primarily managed by Dr. Dwyane Dee, who apparently will be leaving practice. She will transfer to one of his partners.    She also has a history of hyperlipidemia and is managed on atorvastatin 80 mg daily.   Past Medical History: Patient Active Problem List   Diagnosis Date Noted   Cataract cortical, senile, bilateral 06/20/2022   Primary open angle glaucoma (POAG) of both eyes, moderate stage 03/14/2021   Gastroesophageal reflux disease 07/17/2019   Osteoporosis 05/20/2019   Multinodular thyroid 05/20/2019   Tobacco use disorder 01/07/2018   Seasonal allergic rhinitis due to pollen 01/07/2018   Environmental allergies    Type 2 diabetes mellitus with hyperglycemia, with long-term current use of insulin ([place]) 01/04/2016   Hyperlipidemia 01/04/2016   Past Surgical History:  Procedure Laterality Date   APPENDECTOMY  1978   COLONOSCOPY     in [place]   Family History  Problem Relation Age of Onset   Asthma Brother    Thyroid disease Neg Hx    Osteoporosis Neg Hx    Colon cancer Neg Hx    Colon polyps Neg Hx    Stomach cancer Neg Hx    Esophageal cancer Neg Hx    Outpatient Medications Prior to Visit  Medication Sig Dispense Refill   acetaminophen (TYLENOL 8 HOUR) 650 MG CR  tablet Take 1 tablet (650 mg total) by mouth every 8 (eight) hours as needed for pain. 20 tablet 0   atorvastatin (LIPITOR) 80 MG tablet Take 1 tablet by mouth once daily 90 tablet 3   Blood Glucose Monitoring Suppl (AGAMATRIX PRESTO) w/Device KIT Twice daily sugar checks before meals 1 kit 0   Calcium Citrate-Vitamin D (CITRACAL/VITAMIN D) 250-200 MG-UNIT TABS 2 tabs by mouth twice daily 120 each    cetirizine (EQ ALLERGY RELIEF, CETIRIZINE,) 10 MG tablet Take 1 tablet by mouth once daily 90 tablet 3   Continuous Blood Gluc Receiver (DEXCOM G7 RECEIVER) DEVI To display CGM data 1 each 0   Continuous Blood Gluc Sensor (DEXCOM G7 SENSOR) MISC 1 Device by Does not apply route as directed. Change sensor every 10 days 3 each 3   cyclobenzaprine (FLEXERIL) 5 MG tablet Take 1 tablet (5 mg total) by mouth 3 (three) times daily as needed for muscle spasms. 30 tablet 1   dorzolamide (TRUSOPT) 2 % ophthalmic solution 1 drop 2 (two) times daily.     Dulaglutide (TRULICITY) 3 0000000 SOPN INJECT 3 MG (0.5 ML) INTO THE SKIN ONCE A WEEK AS DIRECTED 12 mL 1   Ergocalciferol (VITAMIN D2) 50 MCG (2000 UT) TABS Take by mouth daily.     glucose 4 GM chewable tablet Chew 1 tablet (4 g total) by mouth as needed for low blood sugar. 50 tablet  12   glucose blood (ONE TOUCH ULTRA TEST) test strip Use to check CBGs qam fasting, 2 hrs postprandial, & if having hyper or hypoglycemic sxs. Dx E11.65, Z79.4 400 each 4   hydrOXYzine (VISTARIL) 25 MG capsule Take 1 capsule (25 mg total) by mouth at bedtime as needed (for sleep). 30 capsule 0   ibandronate (BONIVA) 150 MG tablet Take 1 tablet (150 mg total) by mouth every 30 (thirty) days. Take in the morning with a full glass of water, on an empty stomach, and do not take anything else by mouth or lie down for the next 30 min. 3 tablet 3   insulin glargine (LANTUS SOLOSTAR) 100 UNIT/ML Solostar Pen Inject 20 Units into the skin every morning. 15 mL 6   latanoprost (XALATAN) 0.005  % ophthalmic solution INSTILL 1 DROP INTO EACH EYE ONCE DAILY IN THE EVENING     metFORMIN (GLUCOPHAGE) 1000 MG tablet TAKE 1 TABLET BY MOUTH TWICE DAILY WITH A MEAL 180 tablet 3   mometasone (NASONEX) 50 MCG/ACT nasal spray 2 sprays each nostril daily 17 g 5   Olopatadine HCl (PATADAY) 0.2 % SOLN Apply 1 drop to eye 2 (two) times daily. 2.5 mL 2   pantoprazole (PROTONIX) 20 MG tablet TAKE 1 TABLET BY MOUTH ONCE DAILY AS NEEDED FOR HEARTBURN 90 tablet 3   No facility-administered medications prior to visit.   No Known Allergies Objective:   Today's Vitals   10/09/22 1316  BP: 118/66  Pulse: 86  Temp: 98.1 F (36.7 C)  TempSrc: Temporal  SpO2: 98%  Weight: 140 lb (63.5 kg)  Height: 5' (1.524 m)   Body mass index is 27.34 kg/m.   General: Well developed, well nourished. No acute distress. Psych: Alert and oriented. Normal mood and affect.  Health Maintenance Due  Topic Date Due   Medicare Annual Wellness (AWV)  Never done   DTaP/Tdap/Td (1 - Tdap) Never done   Lab Results Last lipids Lab Results  Component Value Date   CHOL 90 12/21/2021   HDL 42.20 12/21/2021   LDLCALC 28 12/21/2021   TRIG 99.0 12/21/2021   CHOLHDL 2 12/21/2021   Last hemoglobin A1c Lab Results  Component Value Date   HGBA1C 7.8 (H) 08/25/2022      Assessment & Plan:   Problem List Items Addressed This Visit       Endocrine   Type 2 diabetes mellitus with hyperglycemia, with long-term current use of insulin (HCC) - Primary (Chronic)    Recently seen by Dr. Dwyane Dee. He increased her Lantus dose. She will continue Lantus 22 units daily, metformin 1,000 mg bid, and dulaglutide (Trulicity) 3 mg weekly.      Relevant Medications   insulin glargine (LANTUS SOLOSTAR) 100 UNIT/ML Solostar Pen     Other   Hyperlipidemia    Lipids have been at goal. Continue atorvastatin 80 mg daily.      Tobacco use disorder    I continue to encourage Alyssa Weaver to stop smoking. She notes she is down to 1/3  ppd. We discussed her cutting this further to 1/4 ppd. I will recheck with her at her next visit.      Return in about 2 months (around 12/13/2022).   Haydee Salter, MD

## 2022-10-24 ENCOUNTER — Ambulatory Visit: Payer: Medicare Other | Admitting: Family Medicine

## 2022-11-23 ENCOUNTER — Telehealth: Payer: Self-pay | Admitting: Family Medicine

## 2022-11-23 NOTE — Telephone Encounter (Signed)
Called patient to schedule Medicare Annual Wellness Visit (AWV). Left message for patient to call back and schedule Medicare Annual Wellness Visit (AWV).  Last date of AWV:  due 05/31/2018 awvi PER PALMETTO  Please schedule an appointment at any time with [facility].  If any questions, please contact me at [phone].  Thank you ,  Shadrack Brummitt [facility] AWV direct phone # [phone] 

## 2022-12-12 ENCOUNTER — Ambulatory Visit: Payer: Medicare Other | Admitting: "Endocrinology

## 2022-12-13 ENCOUNTER — Telehealth: Payer: Self-pay

## 2022-12-13 NOTE — Telephone Encounter (Signed)
PA has been started for  pantoprazole (PROTONIX) 20 MG tablet   (Key: [id number])

## 2022-12-26 ENCOUNTER — Telehealth: Payer: Self-pay

## 2022-12-26 DIAGNOSIS — E1165 Type 2 diabetes mellitus with hyperglycemia: Secondary | ICD-10-CM

## 2022-12-26 MED ORDER — TRULICITY 3 MG/0.5ML ~~LOC~~ SOAJ: SUBCUTANEOUS | 1 refills | Status: DC

## 2022-12-26 NOTE — Telephone Encounter (Signed)
Spoke to patients husband, and refill needed for Trulicity.  RX sent due to having an upcoming appt on 01/08/23. Dm/cma

## 2022-12-31 ENCOUNTER — Other Ambulatory Visit (HOSPITAL_COMMUNITY): Payer: Self-pay

## 2023-01-02 ENCOUNTER — Other Ambulatory Visit: Payer: Medicare Other

## 2023-01-05 ENCOUNTER — Ambulatory Visit: Payer: Medicare Other | Admitting: "Endocrinology

## 2023-01-09 ENCOUNTER — Ambulatory Visit (INDEPENDENT_AMBULATORY_CARE_PROVIDER_SITE_OTHER): Payer: Medicare Other | Admitting: Family Medicine

## 2023-01-09 ENCOUNTER — Telehealth: Payer: Self-pay | Admitting: Family Medicine

## 2023-01-09 VITALS — BP 110/70 | HR 70 | Temp 98.2°F | Ht 60.0 in | Wt 137.2 lb

## 2023-01-09 DIAGNOSIS — J301 Allergic rhinitis due to pollen: Secondary | ICD-10-CM

## 2023-01-09 DIAGNOSIS — F17200 Nicotine dependence, unspecified, uncomplicated: Secondary | ICD-10-CM | POA: Diagnosis not present

## 2023-01-09 DIAGNOSIS — Z7985 Long-term (current) use of injectable non-insulin antidiabetic drugs: Secondary | ICD-10-CM

## 2023-01-09 DIAGNOSIS — Z794 Long term (current) use of insulin: Secondary | ICD-10-CM

## 2023-01-09 DIAGNOSIS — E1165 Type 2 diabetes mellitus with hyperglycemia: Secondary | ICD-10-CM | POA: Diagnosis not present

## 2023-01-09 DIAGNOSIS — E782 Mixed hyperlipidemia: Secondary | ICD-10-CM | POA: Diagnosis not present

## 2023-01-09 DIAGNOSIS — K5900 Constipation, unspecified: Secondary | ICD-10-CM | POA: Diagnosis not present

## 2023-01-09 DIAGNOSIS — F5101 Primary insomnia: Secondary | ICD-10-CM

## 2023-01-09 DIAGNOSIS — K219 Gastro-esophageal reflux disease without esophagitis: Secondary | ICD-10-CM

## 2023-01-09 DIAGNOSIS — Z7984 Long term (current) use of oral hypoglycemic drugs: Secondary | ICD-10-CM

## 2023-01-09 LAB — BASIC METABOLIC PANEL
BUN: 10 mg/dL (ref 6–23)
CO2: 29 mEq/L (ref 19–32)
Calcium: 9.4 mg/dL (ref 8.4–10.5)
Chloride: 105 mEq/L (ref 96–112)
Creatinine, Ser: 0.61 mg/dL (ref 0.40–1.20)
GFR: 90.46 mL/min (ref >60.00)
Glucose, Bld: 86 mg/dL (ref 70–99)
Potassium: 4.2 mEq/L (ref 3.5–5.1)
Sodium: 141 mEq/L (ref 135–145)

## 2023-01-09 LAB — URINALYSIS, ROUTINE W REFLEX MICROSCOPIC
Bilirubin Urine: NEGATIVE
Ketones, ur: NEGATIVE
Leukocytes,Ua: NEGATIVE
Nitrite: NEGATIVE
RBC / HPF: NONE SEEN (ref 0–?)
Specific Gravity, Urine: <=1.005 — AB (ref 1.000–1.030)
Total Protein, Urine: NEGATIVE
Urine Glucose: 250 — AB
Urobilinogen, UA: 0.2 (ref 0.0–1.0)
pH: 7 (ref 5.0–8.0)

## 2023-01-09 LAB — LIPID PANEL
Cholesterol: 105 mg/dL (ref 0–200)
HDL: 40.6 mg/dL (ref >39.00)
LDL Cholesterol: 26 mg/dL (ref 0–99)
NonHDL: 64.26
Total CHOL/HDL Ratio: 3
Triglycerides: 192 mg/dL — ABNORMAL HIGH (ref 0.0–149.0)
VLDL: 38.4 mg/dL (ref 0.0–40.0)

## 2023-01-09 LAB — MICROALBUMIN / CREATININE URINE RATIO
Creatinine,U: 16.3 mg/dL
Microalb Creat Ratio: 4.3 mg/g (ref 0.0–30.0)
Microalb, Ur: <0.7 mg/dL (ref 0.0–1.9)

## 2023-01-09 LAB — HEMOGLOBIN A1C: Hgb A1c MFr Bld: 7.6 % — ABNORMAL HIGH (ref 4.6–6.5)

## 2023-01-09 MED ORDER — TRULICITY 1.5 MG/0.5ML ~~LOC~~ SOAJ
3.0000 mg | SUBCUTANEOUS | 0 refills | Status: DC
Start: 2023-01-09 — End: 2023-06-04

## 2023-01-09 MED ORDER — DOCUSATE SODIUM 100 MG PO CAPS
100.0000 mg | ORAL_CAPSULE | Freq: Two times a day (BID) | ORAL | 0 refills | Status: AC
Start: 2023-01-09 — End: ?

## 2023-01-09 MED ORDER — PANTOPRAZOLE SODIUM 20 MG PO TBEC: DELAYED_RELEASE_TABLET | ORAL | 3 refills | Status: DC

## 2023-01-09 MED ORDER — HYDROXYZINE PAMOATE 25 MG PO CAPS: 25.0000 mg | ORAL_CAPSULE | Freq: Every evening | ORAL | 0 refills | Status: DC | PRN

## 2023-01-09 MED ORDER — MOMETASONE FUROATE 50 MCG/ACT NA SUSP
NASAL | 5 refills | Status: AC
Start: 2023-01-09 — End: ?

## 2023-01-09 NOTE — Patient Instructions (Signed)
Constipation: Our goal is to achieve formed bowel movements daily or every-other-day.  You may need to try different combinations of the following options to find what works best for you - everybody's body works differently so feel free to adjust the dosages as needed.  Some options to help maintain bowel health include:   Dietary changes (more leafy greens, vegetables and fruits; less processed foods) Fiber supplementation (Benefiber, FiberCon, Metamucil or Psyllium). Start slow and increase gradually to full dose. Over-the-counter agents such as: stool softeners (Docusate or Colace) and/or laxatives (Miralax, milk of magnesia)  "Power Pudding" is a natural mixture that may help your constipation.  To make blend 1 cup applesauce, 1 cup wheat bran, and 3/4 cup prune juice, refrigerate and then take 1 tablespoon daily with a large glass of water as needed.  

## 2023-01-09 NOTE — Telephone Encounter (Signed)
PA for Trulicity submitted to [employer] [facility] through cover my meds.  Awaiting response. Dm/cma  Key: Thea Silversmith

## 2023-01-09 NOTE — Assessment & Plan Note (Signed)
I will renew her hydroxyzine. 

## 2023-01-09 NOTE — Assessment & Plan Note (Signed)
Stable. I will renew her pantoprazole.

## 2023-01-09 NOTE — Assessment & Plan Note (Signed)
I will renew her mometasone spray.

## 2023-01-09 NOTE — Telephone Encounter (Signed)
Pharmacy called to let Dr Veto Kemps know Dulaglutide (TRULICITY) 1.5 MG/0.5ML SOPN [[id number]] will need a PA.

## 2023-01-09 NOTE — Assessment & Plan Note (Signed)
Recheck lipids today. Continue atorvastatin 80 mg daily.

## 2023-01-09 NOTE — Assessment & Plan Note (Signed)
Reported home blood sugars sound improved. I will reassess her A1c today along with annual DM labs. She will continue Lantus 22 units daily, metformin 1,000 mg bid, and dulaglutide (Trulicity) 3 mg weekly.

## 2023-01-09 NOTE — Assessment & Plan Note (Signed)
I continue to encourage Alyssa Weaver to stop smoking. She notes she is down to 1/4 ppd. I will recheck with her at her next visit.

## 2023-01-09 NOTE — Telephone Encounter (Signed)
PA approved from 01/09/23- 12/321/24.  Pharmacy notified VIA phone.  Dm/cma

## 2023-01-09 NOTE — Progress Notes (Signed)
[facility] [address] [place] [address] Dept: [phone] Dept Fax: [phone]  Chronic Care Office Visit  Subjective:    Patient ID: Alyssa Weaver, female    DOB: [birth year], 72 y.o..   MRN: [id number]  Chief Complaint  Patient presents with   Medical Management of Chronic Issues    2 month f/u.  Not fasting today.  Average BS 110-120, sometimes 80.  C/o having hard stools- ? Stool softner suggestion.    Medical Interpreter: Simmaly   History of Present Illness:   Patient is in today for reassessment of chronic medical issues.   Alyssa Weaver has a history of Type 2 diabetes. She is managed on Lantus 22 units daily, metformin 1,000 mg bid, and dulaglutide (Trulicity) 3 mg injections weekly. She is primarily managed by Dr. Lucianne Muss. She notes her blood sugar was 80 this morning, so she went ahead and had something to eat (sticky rice in banana leaves, coffee with milk and sugar).    Alyssa Weaver has hyperlipidemia and is managed on atorvastatin 80 mg daily. She continues to smoke ~ 5 cigarettes a day.   Alyssa Weaver has a history of osteoporosis. She is managed on ibandronate (Boniva) 150 mg monthly and takes a daily calcium + Vit. D supplement.  Alyssa Weaver notes recent issues with constipation. She asks about using a stool softener.   Past Medical History: Patient Active Problem List   Diagnosis Date Noted   Cataract cortical, senile, bilateral 06/20/2022   Primary open angle glaucoma (POAG) of both eyes, moderate stage 03/14/2021   Gastroesophageal reflux disease 07/17/2019   Osteoporosis 05/20/2019   Multinodular thyroid 05/20/2019   Tobacco use disorder 01/07/2018   Seasonal allergic rhinitis due to pollen 01/07/2018   Environmental allergies    Type 2 diabetes mellitus with hyperglycemia, with long-term current use of insulin (HCC) 01/04/2016   Hyperlipidemia 01/04/2016   Past Surgical History:   Procedure Laterality Date   APPENDECTOMY  1978   COLONOSCOPY     in [place]   TUBAL LIGATION  1985   Family History  Problem Relation Age of Onset   Asthma Brother    Thyroid disease Neg Hx    Osteoporosis Neg Hx    Colon cancer Neg Hx    Colon polyps Neg Hx    Stomach cancer Neg Hx    Esophageal cancer Neg Hx    Outpatient Medications Prior to Visit  Medication Sig Dispense Refill   acetaminophen (TYLENOL 8 HOUR) 650 MG CR tablet Take 1 tablet (650 mg total) by mouth every 8 (eight) hours as needed for pain. 20 tablet 0   atorvastatin (LIPITOR) 80 MG tablet Take 1 tablet by mouth once daily 90 tablet 3   Blood Glucose Monitoring Suppl (AGAMATRIX PRESTO) w/Device KIT Twice daily sugar checks before meals 1 kit 0   Calcium Citrate-Vitamin D (CITRACAL/VITAMIN D) 250-200 MG-UNIT TABS 2 tabs by mouth twice daily 120 each    cetirizine (EQ ALLERGY RELIEF, CETIRIZINE,) 10 MG tablet Take 1 tablet by mouth once daily 90 tablet 3   Continuous Blood Gluc Receiver (DEXCOM G7 RECEIVER) DEVI To display CGM data 1 each 0   Continuous Blood Gluc Sensor (DEXCOM G7 SENSOR) MISC 1 Device by Does not apply route as directed. Change sensor every 10 days 3 each 3   cyclobenzaprine (FLEXERIL) 5 MG tablet Take 1 tablet (5 mg total) by mouth 3 (three) times daily as needed for muscle spasms. 30 tablet  1   dorzolamide (TRUSOPT) 2 % ophthalmic solution 1 drop 2 (two) times daily.     Dulaglutide (TRULICITY) 3 MG/0.5ML SOPN INJECT 3 MG (0.5 ML) INTO THE SKIN ONCE A WEEK AS DIRECTED 12 mL 1   glucose 4 GM chewable tablet Chew 1 tablet (4 g total) by mouth as needed for low blood sugar. 50 tablet 12   glucose blood (ONE TOUCH ULTRA TEST) test strip Use to check CBGs qam fasting, 2 hrs postprandial, & if having hyper or hypoglycemic sxs. Dx E11.65, Z79.4 400 each 4   ibandronate (BONIVA) 150 MG tablet Take 1 tablet (150 mg total) by mouth every 30 (thirty) days. Take in the morning with a full glass of water, on an  empty stomach, and do not take anything else by mouth or lie down for the next 30 min. 3 tablet 3   insulin glargine (LANTUS SOLOSTAR) 100 UNIT/ML Solostar Pen Inject 22 Units into the skin at bedtime. 15 mL 6   latanoprost (XALATAN) 0.005 % ophthalmic solution INSTILL 1 DROP INTO EACH EYE ONCE DAILY IN THE EVENING     metFORMIN (GLUCOPHAGE) 1000 MG tablet TAKE 1 TABLET BY MOUTH TWICE DAILY WITH A MEAL 180 tablet 3   Olopatadine HCl (PATADAY) 0.2 % SOLN Apply 1 drop to eye 2 (two) times daily. 2.5 mL 2   Ergocalciferol (VITAMIN D2) 50 MCG (2000 UT) TABS Take by mouth daily.     hydrOXYzine (VISTARIL) 25 MG capsule Take 1 capsule (25 mg total) by mouth at bedtime as needed (for sleep). 30 capsule 0   mometasone (NASONEX) 50 MCG/ACT nasal spray 2 sprays each nostril daily 17 g 5   pantoprazole (PROTONIX) 20 MG tablet TAKE 1 TABLET BY MOUTH ONCE DAILY AS NEEDED FOR HEARTBURN 90 tablet 3   No facility-administered medications prior to visit.   No Known Allergies Objective:   Today's Vitals   01/09/23 0944  BP: 110/70  Pulse: 70  Temp: 98.2 F (36.8 C)  TempSrc: Temporal  SpO2: 99%  Weight: 137 lb 3.2 oz (62.2 kg)  Height: 5' (1.524 m)   Body mass index is 26.8 kg/m.   General: Well developed, well nourished. No acute distress. Lungs: Clear to auscultation bilaterally. No wheezing, rales or rhonchi. Psych: Alert and oriented. Normal mood and affect.  Health Maintenance Due  Topic Date Due   Medicare Annual Wellness (AWV)  Never done   DTaP/Tdap/Td (1 - Tdap) Never done   Diabetic kidney evaluation - Urine ACR  12/22/2022     Assessment & Plan:   Problem List Items Addressed This Visit       Respiratory   Seasonal allergic rhinitis due to pollen    I will renew her mometasone spray.      Relevant Medications   mometasone (NASONEX) 50 MCG/ACT nasal spray     Digestive   Gastroesophageal reflux disease    Stable. I will renew her pantoprazole.      Relevant  Medications   pantoprazole (PROTONIX) 20 MG tablet   docusate sodium (COLACE) 100 MG capsule     Endocrine   Type 2 diabetes mellitus with hyperglycemia, with long-term current use of insulin (HCC) - Primary (Chronic)    Reported home blood sugars sound improved. I will reassess her A1c today along with annual DM labs. She will continue Lantus 22 units daily, metformin 1,000 mg bid, and dulaglutide (Trulicity) 3 mg weekly.      Relevant Medications   Dulaglutide (TRULICITY) 1.5  MG/0.5ML SOPN   Other Relevant Orders   Microalbumin / creatinine urine ratio   Basic metabolic panel   Hemoglobin A1c   Urinalysis, Routine w reflex microscopic     Other   Hyperlipidemia    Recheck lipids today. Continue atorvastatin 80 mg daily.      Relevant Orders   Lipid panel   Tobacco use disorder    I continue to encourage Ms. Napora to stop smoking. She notes she is down to 1/4 ppd. I will recheck with her at her next visit.      Primary insomnia    I will renew her hydroxyzine.      Relevant Medications   hydrOXYzine (VISTARIL) 25 MG capsule   Other Visit Diagnoses     Constipation, unspecified constipation type       I will add colace, but also provide information on other home remedies to help constipation.   Relevant Medications   docusate sodium (COLACE) 100 MG capsule       Return in about 3 months (around 04/11/2023) for Reassessment.   Loyola Mast, MD

## 2023-01-10 NOTE — Telephone Encounter (Signed)
Need to check on this PA to get approval for quanitity exception.  Dm/cma

## 2023-01-10 NOTE — Telephone Encounter (Signed)
Key: [id number] Quantity exception submitted. Dm/cma

## 2023-01-11 NOTE — Telephone Encounter (Signed)
PA for Quantity exception denied due to not meeting requirement due to a higher dose is available and the max plan's limit is 4 pens (2ml) per 28 days.  Dm/cma

## 2023-02-05 ENCOUNTER — Other Ambulatory Visit: Payer: Self-pay | Admitting: Family Medicine

## 2023-02-05 DIAGNOSIS — F5101 Primary insomnia: Secondary | ICD-10-CM

## 2023-02-12 LAB — HM MAMMOGRAPHY

## 2023-02-13 ENCOUNTER — Encounter: Payer: Self-pay | Admitting: Family Medicine

## 2023-02-21 ENCOUNTER — Telehealth: Payer: Self-pay | Admitting: Family Medicine

## 2023-02-21 NOTE — Telephone Encounter (Signed)
A [facility] rep called and wanted to know if the pt has had a mammogram procedure done yet . [facility] # is [phone]

## 2023-02-21 NOTE — Telephone Encounter (Signed)
Patient had mammogram done on 02/12/23.  Tried calling to advise and unable to get through at number left. Dm/cma

## 2023-03-15 ENCOUNTER — Encounter (INDEPENDENT_AMBULATORY_CARE_PROVIDER_SITE_OTHER): Payer: Self-pay

## 2023-04-10 ENCOUNTER — Other Ambulatory Visit (HOSPITAL_COMMUNITY): Payer: Self-pay

## 2023-04-10 ENCOUNTER — Telehealth: Payer: Self-pay

## 2023-04-10 NOTE — Telephone Encounter (Signed)
Pharmacy Patient Advocate Encounter   Received notification from CoverMyMeds that prior authorization for Pantoprazole Sodium 20MG  dr tablets  is required/requested.   Insurance verification completed.   The patient is insured through [facility] .   Per test claim: The current 90 day co-pay is, $0.  No PA needed at this time. This test claim was processed through [facility]- copay amounts may vary at other pharmacies due to pharmacy/plan contracts, or as the patient moves through the different stages of their insurance plan.     Called pharmacy, they received a paid claim.

## 2023-04-11 ENCOUNTER — Encounter: Payer: Self-pay | Admitting: Family Medicine

## 2023-04-11 ENCOUNTER — Ambulatory Visit (INDEPENDENT_AMBULATORY_CARE_PROVIDER_SITE_OTHER): Payer: Medicare Other | Admitting: Family Medicine

## 2023-04-11 VITALS — BP 122/66 | HR 80 | Temp 97.0°F | Ht 60.0 in | Wt 138.0 lb

## 2023-04-11 DIAGNOSIS — E1165 Type 2 diabetes mellitus with hyperglycemia: Secondary | ICD-10-CM | POA: Diagnosis not present

## 2023-04-11 DIAGNOSIS — E782 Mixed hyperlipidemia: Secondary | ICD-10-CM | POA: Diagnosis not present

## 2023-04-11 DIAGNOSIS — G8929 Other chronic pain: Secondary | ICD-10-CM

## 2023-04-11 DIAGNOSIS — M25511 Pain in right shoulder: Secondary | ICD-10-CM

## 2023-04-11 DIAGNOSIS — F17200 Nicotine dependence, unspecified, uncomplicated: Secondary | ICD-10-CM

## 2023-04-11 DIAGNOSIS — Z794 Long term (current) use of insulin: Secondary | ICD-10-CM | POA: Diagnosis not present

## 2023-04-11 LAB — HEMOGLOBIN A1C: Hgb A1c MFr Bld: 8.4 % — ABNORMAL HIGH (ref 4.6–6.5)

## 2023-04-11 LAB — GLUCOSE, RANDOM: Glucose, Bld: 356 mg/dL — ABNORMAL HIGH (ref 70–99)

## 2023-04-11 NOTE — Progress Notes (Signed)
[facility] [address] [place] [address] Dept: [phone] Dept Fax: [phone]  Chronic Care Office Visit  Subjective:    Patient ID: Alyssa Weaver, female    DOB: Aug 19, 1950, 72 y.o..   MRN: [id number]  Chief Complaint  Patient presents with   Diabetes    3 month f/u. No concerns.  BS at home 71-90.    History of Present Illness:  Patient is in today for reassessment of chronic medical issues.  Alyssa Weaver has a history of Type 2 diabetes. She is managed on Lantus 22 units daily, metformin 1,000 mg bid, and dulaglutide (Trulicity) 3 mg injections weekly. She had issues with beign able to obtain the 3 mg dose of Trulicity, so has had to use two 1.5 mg injections. She had been seeing Dr. Lucianne Muss (endocrinology) who has now retired. She has been unable to be seen by her new endocrinologist yet. She notes her blood sugar was 71 this morning.    Alyssa Weaver has hyperlipidemia and is managed on atorvastatin 80 mg daily. She continues to smoke ~ 5 cigarettes a day.    Alyssa Weaver has a history of osteoporosis. She is managed on ibandronate (Boniva) 150 mg monthly and takes a daily calcium + Vit. D supplement.   Alyssa Weaver notes some chronic right shoulder pain. This is bothersome when doing work with her arm up high. She has been using [employer] on this.  Past Medical History: Patient Active Problem List   Diagnosis Date Noted   Primary insomnia 01/09/2023   Cataract cortical, senile, bilateral 06/20/2022   Primary open angle glaucoma (POAG) of both eyes, moderate stage 03/14/2021   Gastroesophageal reflux disease 07/17/2019   Osteoporosis 05/20/2019   Multinodular thyroid 05/20/2019   Tobacco use disorder 01/07/2018   Seasonal allergic rhinitis due to pollen 01/07/2018   Environmental allergies    Type 2 diabetes mellitus with hyperglycemia, with long-term current use of insulin (HCC) 01/04/2016    Hyperlipidemia 01/04/2016   Past Surgical History:  Procedure Laterality Date   APPENDECTOMY  1978   COLONOSCOPY     in [place]   TUBAL LIGATION  1985   Family History  Problem Relation Age of Onset   Asthma Brother    Thyroid disease Neg Hx    Osteoporosis Neg Hx    Colon cancer Neg Hx    Colon polyps Neg Hx    Stomach cancer Neg Hx    Esophageal cancer Neg Hx    Outpatient Medications Prior to Visit  Medication Sig Dispense Refill   acetaminophen (TYLENOL 8 HOUR) 650 MG CR tablet Take 1 tablet (650 mg total) by mouth every 8 (eight) hours as needed for pain. 20 tablet 0   atorvastatin (LIPITOR) 80 MG tablet Take 1 tablet by mouth once daily 90 tablet 3   Blood Glucose Monitoring Suppl (AGAMATRIX PRESTO) w/Device KIT Twice daily sugar checks before meals 1 kit 0   Calcium Citrate-Vitamin D (CITRACAL/VITAMIN D) 250-200 MG-UNIT TABS 2 tabs by mouth twice daily 120 each    cetirizine (EQ ALLERGY RELIEF, CETIRIZINE,) 10 MG tablet Take 1 tablet by mouth once daily 90 tablet 3   Continuous Blood Gluc Receiver (DEXCOM G7 RECEIVER) DEVI To display CGM data 1 each 0   Continuous Blood Gluc Sensor (DEXCOM G7 SENSOR) MISC 1 Device by Does not apply route as directed. Change sensor every 10 days 3 each 3   cyclobenzaprine (FLEXERIL) 5 MG tablet Take 1 tablet (  5 mg total) by mouth 3 (three) times daily as needed for muscle spasms. 30 tablet 1   docusate sodium (COLACE) 100 MG capsule Take 1 capsule (100 mg total) by mouth 2 (two) times daily. 10 capsule 0   dorzolamide (TRUSOPT) 2 % ophthalmic solution 1 drop 2 (two) times daily.     Dulaglutide (TRULICITY) 1.5 MG/0.5ML SOPN Inject 3 mg into the skin once a week. 2 mL 0   Dulaglutide (TRULICITY) 3 MG/0.5ML SOPN INJECT 3 MG (0.5 ML) INTO THE SKIN ONCE A WEEK AS DIRECTED 12 mL 1   glucose 4 GM chewable tablet Chew 1 tablet (4 g total) by mouth as needed for low blood sugar. 50 tablet 12   glucose blood (ONE TOUCH ULTRA TEST) test strip Use to check  CBGs qam fasting, 2 hrs postprandial, & if having hyper or hypoglycemic sxs. Dx E11.65, Z79.4 400 each 4   hydrOXYzine (VISTARIL) 25 MG capsule TAKE 1 CAPSULE BY MOUTH AT BEDTIME AS NEEDED FOR SLEEP 30 capsule 0   ibandronate (BONIVA) 150 MG tablet Take 1 tablet (150 mg total) by mouth every 30 (thirty) days. Take in the morning with a full glass of water, on an empty stomach, and do not take anything else by mouth or lie down for the next 30 min. 3 tablet 3   insulin glargine (LANTUS SOLOSTAR) 100 UNIT/ML Solostar Pen Inject 22 Units into the skin at bedtime. 15 mL 6   latanoprost (XALATAN) 0.005 % ophthalmic solution INSTILL 1 DROP INTO EACH EYE ONCE DAILY IN THE EVENING     metFORMIN (GLUCOPHAGE) 1000 MG tablet TAKE 1 TABLET BY MOUTH TWICE DAILY WITH A MEAL 180 tablet 3   mometasone (NASONEX) 50 MCG/ACT nasal spray 2 sprays each nostril daily 17 g 5   Olopatadine HCl (PATADAY) 0.2 % SOLN Apply 1 drop to eye 2 (two) times daily. 2.5 mL 2   pantoprazole (PROTONIX) 20 MG tablet TAKE 1 TABLET BY MOUTH ONCE DAILY AS NEEDED FOR HEARTBURN 90 tablet 3   No facility-administered medications prior to visit.   No Known Allergies Objective:   Today's Vitals   04/11/23 1254  BP: 122/66  Pulse: 80  Temp: (!) 97 F (36.1 C)  TempSrc: Temporal  SpO2: 100%  Weight: 138 lb (62.6 kg)  Height: 5' (1.524 m)   Body mass index is 26.95 kg/m.   General: Well developed, well nourished. No acute distress. Extremities: Full ROM of right shoulder. No joint swelling or tenderness. No crepitance. Good rotator   cuff strength.  Psych: Alert and oriented. Normal mood and affect.  Health Maintenance Due  Topic Date Due   Medicare Annual Wellness (AWV)  Never done   DTaP/Tdap/Td (1 - Tdap) Never done     Assessment & Plan:   Problem List Items Addressed This Visit       Endocrine   Type 2 diabetes mellitus with hyperglycemia, with long-term current use of insulin (HCC) - Primary (Chronic)    I will  reassess her A1c today. She will continue Lantus 22 units daily, metformin 1,000 mg bid, and dulaglutide (Trulicity) 3 mg weekly. I will see if we can get her in with a new endocrinologist now that Dr. Lucianne Muss has retired.      Relevant Orders   Ambulatory referral to Endocrinology   Glucose, random   Hemoglobin A1c     Other   Hyperlipidemia    Recheck lipids today. Continue atorvastatin 80 mg daily.  Tobacco use disorder    I continue to encourage Ms. Presto to stop smoking.      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 Discussed ROM with heat. She can continue use of Tiger Balm.       Return in about 3 months (around 07/11/2023) for Reassessment.   Loyola Mast, MD

## 2023-04-11 NOTE — Assessment & Plan Note (Signed)
Recheck lipids today. Continue atorvastatin 80 mg daily.

## 2023-04-11 NOTE — Assessment & Plan Note (Signed)
I continue to encourage Alyssa Weaver to stop smoking.

## 2023-04-11 NOTE — Assessment & Plan Note (Signed)
I will reassess her A1c today. She will continue Lantus 22 units daily, metformin 1,000 mg bid, and dulaglutide (Trulicity) 3 mg weekly. I will see if we can get her in with a new endocrinologist now that Dr. Lucianne Muss has retired.

## 2023-04-23 LAB — HM DIABETES EYE EXAM

## 2023-04-24 ENCOUNTER — Encounter: Payer: Self-pay | Admitting: Family Medicine

## 2023-05-16 ENCOUNTER — Other Ambulatory Visit: Payer: Medicare Other

## 2023-05-16 DIAGNOSIS — Z794 Long term (current) use of insulin: Secondary | ICD-10-CM | POA: Diagnosis not present

## 2023-05-16 DIAGNOSIS — E1165 Type 2 diabetes mellitus with hyperglycemia: Secondary | ICD-10-CM

## 2023-05-16 DIAGNOSIS — E042 Nontoxic multinodular goiter: Secondary | ICD-10-CM

## 2023-05-17 LAB — BASIC METABOLIC PANEL
BUN: 16 mg/dL (ref 6–23)
CO2: 25 meq/L (ref 19–32)
Calcium: 8.9 mg/dL (ref 8.4–10.5)
Chloride: 101 meq/L (ref 96–112)
Creatinine, Ser: 0.77 mg/dL (ref 0.40–1.20)
GFR: 77.86 mL/min (ref >60.00)
Glucose, Bld: 388 mg/dL — ABNORMAL HIGH (ref 70–99)
Potassium: 4 meq/L (ref 3.5–5.1)
Sodium: 139 meq/L (ref 135–145)

## 2023-05-17 LAB — TSH: TSH: 0.29 u[IU]/mL — ABNORMAL LOW (ref 0.35–5.50)

## 2023-05-17 LAB — T4, FREE: Free T4: 0.78 ng/dL (ref 0.60–1.60)

## 2023-05-21 ENCOUNTER — Ambulatory Visit: Payer: Medicare Other | Admitting: "Endocrinology

## 2023-06-04 ENCOUNTER — Ambulatory Visit: Payer: Medicare Other | Admitting: "Endocrinology

## 2023-06-04 ENCOUNTER — Encounter: Payer: Self-pay | Admitting: "Endocrinology

## 2023-06-04 VITALS — BP 94/54 | HR 91 | Ht 60.0 in | Wt 141.6 lb

## 2023-06-04 DIAGNOSIS — E0590 Thyrotoxicosis, unspecified without thyrotoxic crisis or storm: Secondary | ICD-10-CM

## 2023-06-04 DIAGNOSIS — E1165 Type 2 diabetes mellitus with hyperglycemia: Secondary | ICD-10-CM

## 2023-06-04 DIAGNOSIS — E042 Nontoxic multinodular goiter: Secondary | ICD-10-CM | POA: Diagnosis not present

## 2023-06-04 DIAGNOSIS — Z794 Long term (current) use of insulin: Secondary | ICD-10-CM | POA: Diagnosis not present

## 2023-06-04 DIAGNOSIS — M810 Age-related osteoporosis without current pathological fracture: Secondary | ICD-10-CM | POA: Diagnosis not present

## 2023-06-04 MED ORDER — TRULICITY 3 MG/0.5ML ~~LOC~~ SOAJ: 3.0000 mg | SUBCUTANEOUS | 1 refills | Status: DC

## 2023-06-04 NOTE — Progress Notes (Signed)
Patient ID: Alyssa Weaver, female   DOB: [birth year], 72 y.o.   MRN: [id number]                  Reason for Appointment: Endocrinology follow-up  Chief complaint: None    History of Present Illness:   History of goiter: Patient was initially evaluated for endocrinology management because of abnormal TSH in 2018 checked by her PCP She did not have any symptoms at that time of weight loss, palpitations, weakness Did not have any history of neck swelling or difficulty swallowing  Her highest baseline free T4 was only about 1.4 Free T3 has not been higher than 3.2  However she was initially given methimazole in 2020 and subsequently had been on PTU She did not feel any different with starting the PTU On her initial visit in 7/23 she was taking only 50 mg PTU once a day  With stopping her PTU her thyroid functions were nearly the same with low normal TSH  Currently denies palpitations/weight loss/heat intolerance/hyperdefecation Not on any thyroid medication  Wt Readings from Last 3 Encounters:  06/04/23 141 lb 9.6 oz (64.2 kg)  04/11/23 138 lb (62.6 kg)  01/09/23 137 lb 3.2 oz (62.2 kg)    Thyroid function tests as follows:     Baseline TSH 0.135 in 2018, was 0.2 in 2019  Lab Results  Component Value Date   FREET4 0.78 05/16/2023   FREET4 0.92 04/17/2022   FREET4 0.96 02/09/2022   T3FREE 2.8 07/19/2018   T3FREE 2.6 03/06/2018   T3FREE 2.5 01/07/2018   TSH 0.29 (L) 05/16/2023   TSH 0.36 04/17/2022   TSH 0.46 02/09/2022   Lab Results  Component Value Date   FREET4 0.78 05/16/2023   FREET4 0.92 04/17/2022   FREET4 0.96 02/09/2022   FREET4 0.72 09/27/2021   FREET4 0.82 05/23/2021   FREET4 0.82 01/25/2021   FREET4 0.98 10/25/2020   FREET4 1.30 05/03/2020   FREET4 0.98 07/01/2019    Lab Results  Component Value Date   THYROTRECAB <1.10 02/09/2022   THYROID nodules: She had an incidental thyroid nodule discovered on a CT scan and subsequently was sent for  ultrasound and thyroid biopsies  On: 01/23/2018 Heterogeneous complex multinodular thyroid.   Recommend biopsy of the 2 most suspicious nodules, nodule 7 and 8  Needle aspiration showed Bethesda category 2 cytology and no further follow-up recommended    No dysphagia/dysphonia/dyspnea  DIABETES: See review of systems  Allergies as of 06/04/2023   No Known Allergies      Medication List        Accurate as of June 04, 2023  1:42 PM. If you have any questions, ask your nurse or doctor.          STOP taking these medications    Trulicity 1.5 MG/0.5ML Soaj Generic drug: Dulaglutide Stopped by: Altamese [address] [place]   Trulicity 3 MG/0.5ML Soaj Generic drug: Dulaglutide Stopped by: Altamese [place]       TAKE these medications    acetaminophen 650 MG CR tablet Commonly known as: Tylenol 8 Hour Take 1 tablet (650 mg total) by mouth every 8 (eight) hours as needed for pain.   AgaMatrix Presto w/Device Kit Twice daily sugar checks before meals   atorvastatin 80 MG tablet Commonly known as: LIPITOR Take 1 tablet by mouth once daily   Calcium Citrate-Vitamin D 250-200 MG-UNIT Tabs Commonly known as: Citracal/Vitamin D 2 tabs by mouth twice daily   cyclobenzaprine 5 MG tablet Commonly known as:  FLEXERIL Take 1 tablet (5 mg total) by mouth 3 (three) times daily as needed for muscle spasms.   Dexcom G7 Receiver Devi To display CGM data   Dexcom G7 Sensor Misc 1 Device by Does not apply route as directed. Change sensor every 10 days   docusate sodium 100 MG capsule Commonly known as: Colace Take 1 capsule (100 mg total) by mouth 2 (two) times daily.   dorzolamide 2 % ophthalmic solution Commonly known as: TRUSOPT 1 drop 2 (two) times daily.   EQ Allergy Relief (Cetirizine) 10 MG tablet Generic drug: cetirizine Take 1 tablet by mouth once daily   glucose 4 GM chewable tablet Chew 1 tablet (4 g total) by mouth as needed for low blood sugar.   glucose blood test  strip Commonly known as: ONE TOUCH ULTRA TEST Use to check CBGs qam fasting, 2 hrs postprandial, & if having hyper or hypoglycemic sxs. Dx E11.65, Z79.4   hydrOXYzine 25 MG capsule Commonly known as: VISTARIL TAKE 1 CAPSULE BY MOUTH AT BEDTIME AS NEEDED FOR SLEEP   ibandronate 150 MG tablet Commonly known as: BONIVA Take 1 tablet (150 mg total) by mouth every 30 (thirty) days. Take in the morning with a full glass of water, on an empty stomach, and do not take anything else by mouth or lie down for the next 30 min.   Lantus SoloStar 100 UNIT/ML Solostar Pen Generic drug: insulin glargine Inject 22 Units into the skin at bedtime.   latanoprost 0.005 % ophthalmic solution Commonly known as: XALATAN INSTILL 1 DROP INTO EACH EYE ONCE DAILY IN THE EVENING   metFORMIN 1000 MG tablet Commonly known as: GLUCOPHAGE TAKE 1 TABLET BY MOUTH TWICE DAILY WITH A MEAL   mometasone 50 MCG/ACT nasal spray Commonly known as: Nasonex 2 sprays each nostril daily   Olopatadine HCl 0.2 % Soln Commonly known as: Pataday Apply 1 drop to eye 2 (two) times daily.   pantoprazole 20 MG tablet Commonly known as: PROTONIX TAKE 1 TABLET BY MOUTH ONCE DAILY AS NEEDED FOR HEARTBURN            Past Medical History:  Diagnosis Date   Allergy    Diabetes mellitus without complication (HCC)    GERD (gastroesophageal reflux disease)    Hyperlipidemia    Hypertension    Osteoporosis     Past Surgical History:  Procedure Laterality Date   APPENDECTOMY  1978   COLONOSCOPY     in [place]   TUBAL LIGATION  1985    Family History  Problem Relation Age of Onset   Asthma Brother    Thyroid disease Neg Hx    Osteoporosis Neg Hx    Colon cancer Neg Hx    Colon polyps Neg Hx    Stomach cancer Neg Hx    Esophageal cancer Neg Hx     Social History:  reports that she has been smoking cigarettes. She has a 10.8 pack-year smoking history. She has never used smokeless tobacco. She reports that she does not  drink alcohol and does not use drugs.  Allergies: No Known Allergies   Review of Systems  DIABETES type II: She is on basal insulin using Lantus 22 units in a.m., metformin 1 g twice daily and Trulicity 3 mg weekly (ran out of Trulicity 3 weeks ago due to shortage). Doesn't want to resume Trulicity/change to [facility] because feels no difference in BG /Weight with Trulicity.  She refused to consider the Dexcom that was recommended previously  Her blood sugars at home appear to be much lower than expected for her A1c She was referred for nutritional counseling previously but she did not make an appointment One Touch ultra 2 Blood sugar readings  BG log: 80-126, checks tidac  Weight history:  Wt Readings from Last 3 Encounters:  06/04/23 141 lb 9.6 oz (64.2 kg)  04/11/23 138 lb (62.6 kg)  01/09/23 137 lb 3.2 oz (62.2 kg)    Lab Results  Component Value Date   HGBA1C 8.4 (H) 04/11/2023   HGBA1C 7.6 (H) 01/09/2023   HGBA1C 7.8 (H) 08/25/2022   Lab Results  Component Value Date   MICROALBUR <0.7 01/09/2023   LDLCALC 26 01/09/2023   CREATININE 0.77 05/16/2023    OSTEOPOROSIS: She was started on Boniva after her screening bone density in 05/2019 showed osteoporosis at the spine with T score -2.5 She takes this regularly without side effect Last vitamin D level also normal  Bone density results: T-score AP Spine L1-L4 10/27/2021 69.4 -1.9 0.947 g/cm2 * AP Spine  L1-L4      05/13/2019    66.9         -2.5    0.882 g/cm2   DualFemur Neck Left  10/27/2021    69.4         -1.3    0.860 g/cm2 DualFemur Neck Left  05/13/2019    66.9         -1.5    0.828 g/cm2   DualFemur Total Mean 10/27/2021    69.4         -0.3    0.970 g/cm2 DualFemur Total Mean 05/13/2019    66.9         -0.4    0.961 g/cm2  On calcium, Vit D and Boniva monthly since 3 years    Examination:   BP (!) 94/54   Pulse 91   Ht 5' (1.524 m)   Wt 141 lb 9.6 oz (64.2 kg)   SpO2 98%   BMI 27.65 kg/m   Firm  slightly nodular right thyroid lobe is palpable medially mostly on swallowing and about twice normal Left side not palpable  Assessment/Plan:  DIABETES type II No history of MI/stroke/neuropathy/retinopathy/nephropathy Continue Lantus 22 units in a.m., metformin 1 g twice daily Not keen on CGM/GLP1/seeing nutritionist for diabetes education  Multinodular goiter She had an incidental thyroid nodule discovered on a CT scan and subsequently was sent for ultrasound and thyroid biopsies On: 01/23/2018 Heterogeneous complex multinodular thyroid. Recommend biopsy of the 2 most suspicious nodules, nodule 7 and 8 Needle aspiration showed Bethesda category 2 cytology  Order f/u thyroid U/S next visit   Subclinical hyperthyroidism She has a history of an autonomously functioning thyroid noted to multinodular goiter. Doesn't need any thyroid medication  Has been euthyroid without her low-dose PTU regimen and thyroid levels can be checked periodically  Osteoporosis Repeat DXA 10/2022 Continue Calcium, Vit D every day Continue Boniva 150 mg monthly  No current issues reported with her jaws  Patient Instructions   Goals of DM therapy:  Morning Fasting blood sugar: 80-140  Blood sugar before meals: 80-140 Bed time blood sugar: 100-150  A1C <7%, limited only by hypoglycemia  1.Diabetes medications and their side effects discussed, including hypoglycemia    2. Check blood glucose:  a) Always check blood sugars before driving. Please see below (under hypoglycemia) on how to manage b) Check a minimum of 3 times/day or more as needed when having symptoms of hypoglycemia.  c) Try to check blood glucose before sleeping/in the middle of the night to ensure that it is remaining stable and not dropping less than 100 d) Check blood glucose more often if sick  3. Diet: a) 3 meals per day schedule b: Restrict carbs to 60-70 grams (4 servings) per meal c) Colorful vegetables - 3 servings a day, and  low sugar fruit 2 servings/day Plate control method: 1/4 plate protein, 1/4 starch, 1/2 green, yellow, or red vegetables d) Avoid carbohydrate snacks unless hypoglycemic episode, or increased physical activity  4. Regular exercise as tolerated, preferably 3 or more hours a week  5. Hypoglycemia: a)  Do not drive or operate machinery without first testing blood glucose to assure it is over 90 mg%, or if dizzy, lightheaded, not feeling normal, etc, or  if foot or leg is numb or weak. b)  If blood glucose less than 70, take four 5gm Glucose tabs or 15-30 gm Glucose gel.  Repeat every 15 min as needed until blood sugar is >100 mg/dl. If hypoglycemia persists then call 911.   6. Sick day management: a) Check blood glucose more often b) Continue usual therapy if blood sugars are elevated.   7. Contact the doctor immediately if blood glucose is frequently <60 mg/dl, or an episode of severe hypoglycemia occurs (where someone had to give you glucose/  glucagon or if you passed out from a low blood glucose), or if blood glucose is persistently >350 mg/dl, for further management  8. A change in level of physical activity or exercise and a change in diet may also affect your blood sugar. Check blood sugars more often and call if needed.  Instructions: 1. Bring glucose meter, blood glucose records on every visit for review 2. Continue to follow up with primary care physician and other providers for medical care 3. Yearly eye  and foot exam 4. Please get blood work done prior to the next appointment    Altamese [place] 06/04/2023, 1:42 PM    Note: This office note was prepared with Dragon voice recognition system technology. Any transcriptional errors that result from this process are unintentional.

## 2023-06-04 NOTE — Patient Instructions (Signed)

## 2023-06-07 ENCOUNTER — Other Ambulatory Visit: Payer: Self-pay | Admitting: Family Medicine

## 2023-06-07 DIAGNOSIS — E782 Mixed hyperlipidemia: Secondary | ICD-10-CM

## 2023-06-24 ENCOUNTER — Other Ambulatory Visit: Payer: Self-pay | Admitting: Family Medicine

## 2023-06-24 DIAGNOSIS — E1165 Type 2 diabetes mellitus with hyperglycemia: Secondary | ICD-10-CM

## 2023-06-27 ENCOUNTER — Other Ambulatory Visit: Payer: Self-pay | Admitting: Family Medicine

## 2023-06-27 DIAGNOSIS — E1165 Type 2 diabetes mellitus with hyperglycemia: Secondary | ICD-10-CM

## 2023-07-11 ENCOUNTER — Encounter: Payer: Self-pay | Admitting: Family Medicine

## 2023-07-11 ENCOUNTER — Ambulatory Visit: Payer: Medicare Other | Admitting: Family Medicine

## 2023-07-11 VITALS — BP 118/66 | HR 88 | Temp 97.9°F | Ht 60.0 in | Wt 142.2 lb

## 2023-07-11 DIAGNOSIS — J301 Allergic rhinitis due to pollen: Secondary | ICD-10-CM

## 2023-07-11 DIAGNOSIS — Z794 Long term (current) use of insulin: Secondary | ICD-10-CM | POA: Diagnosis not present

## 2023-07-11 DIAGNOSIS — E1165 Type 2 diabetes mellitus with hyperglycemia: Secondary | ICD-10-CM

## 2023-07-11 DIAGNOSIS — Z7984 Long term (current) use of oral hypoglycemic drugs: Secondary | ICD-10-CM

## 2023-07-11 DIAGNOSIS — E782 Mixed hyperlipidemia: Secondary | ICD-10-CM

## 2023-07-11 DIAGNOSIS — K219 Gastro-esophageal reflux disease without esophagitis: Secondary | ICD-10-CM

## 2023-07-11 DIAGNOSIS — M810 Age-related osteoporosis without current pathological fracture: Secondary | ICD-10-CM

## 2023-07-11 LAB — HEMOGLOBIN A1C: Hgb A1c MFr Bld: 8.8 % — ABNORMAL HIGH (ref 4.6–6.5)

## 2023-07-11 LAB — GLUCOSE, RANDOM: Glucose, Bld: 241 mg/dL — ABNORMAL HIGH (ref 70–99)

## 2023-07-11 MED ORDER — CETIRIZINE HCL 10 MG PO TABS: 10.0000 mg | ORAL_TABLET | Freq: Every day | ORAL | 3 refills | Status: DC

## 2023-07-11 MED ORDER — PANTOPRAZOLE SODIUM 20 MG PO TBEC: DELAYED_RELEASE_TABLET | ORAL | 3 refills | Status: DC

## 2023-07-11 MED ORDER — TRULICITY 3 MG/0.5ML ~~LOC~~ SOAJ: 3.0000 mg | SUBCUTANEOUS | 3 refills | Status: DC

## 2023-07-11 MED ORDER — IBANDRONATE SODIUM 150 MG PO TABS: 150.0000 mg | ORAL_TABLET | ORAL | 3 refills | Status: AC

## 2023-07-11 MED ORDER — LANTUS SOLOSTAR 100 UNIT/ML ~~LOC~~ SOPN: 22.0000 [IU] | PEN_INJECTOR | Freq: Every day | SUBCUTANEOUS | 6 refills | Status: DC

## 2023-07-11 NOTE — Progress Notes (Signed)
[facility] [address] [place] [address] Dept: [phone] Dept Fax: [phone]  Chronic Care Office Visit  Subjective:    Patient ID: Alyssa Weaver, female    DOB: 1951/02/22, 72 y.o..   MRN: [id number]  Chief Complaint  Patient presents with   Diabetes    3 month f/u DM.  No concerns.    History of Present Illness:  Patient is in today for reassessment of chronic medical issues.  Alyssa Weaver has a history of Type 2 diabetes. She is managed on insulin glargine (Lantus) 22 units daily, metformin 1,000 mg bid, dulaglutide (Trulicity) 3 mg injections weekly. She is now managed by Dr. Roosevelt Locks (endocrinology). She notes her fasting glucose today was 95, though she sometimes has sugars in the 70s. She had been offered a CGM in the past, but felt this was unaffordable. She continues to do FSBS.    Alyssa Weaver has hyperlipidemia and is managed on atorvastatin 80 mg daily. She continues to smoke ~ 5 cigarettes a day.    Alyssa Weaver has a history of osteoporosis. She is managed on ibandronate (Boniva) 150 mg monthly and takes a daily calcium + Vit. D supplement.   Past Medical History: Patient Active Problem List   Diagnosis Date Noted   Primary insomnia 01/09/2023   Cataract cortical, senile, bilateral 06/20/2022   Primary open angle glaucoma (POAG) of both eyes, moderate stage 03/14/2021   Gastroesophageal reflux disease 07/17/2019   Osteoporosis 05/20/2019   Multinodular thyroid 05/20/2019   Tobacco use disorder 01/07/2018   Seasonal allergic rhinitis due to pollen 01/07/2018   Environmental allergies    Type 2 diabetes mellitus with hyperglycemia, with long-term current use of insulin (HCC) 01/04/2016   Hyperlipidemia 01/04/2016   Past Surgical History:  Procedure Laterality Date   APPENDECTOMY  1978   COLONOSCOPY     in [place]   TUBAL LIGATION  1985   Family History  Problem Relation Age of Onset   Asthma  Brother    Thyroid disease Neg Hx    Osteoporosis Neg Hx    Colon cancer Neg Hx    Colon polyps Neg Hx    Stomach cancer Neg Hx    Esophageal cancer Neg Hx    Outpatient Medications Prior to Visit  Medication Sig Dispense Refill   acetaminophen (TYLENOL 8 HOUR) 650 MG CR tablet Take 1 tablet (650 mg total) by mouth every 8 (eight) hours as needed for pain. 20 tablet 0   atorvastatin (LIPITOR) 80 MG tablet Take 1 tablet by mouth once daily 90 tablet 3   Blood Glucose Monitoring Suppl (AGAMATRIX PRESTO) w/Device KIT Twice daily sugar checks before meals 1 kit 0   Calcium Citrate-Vitamin D (CITRACAL/VITAMIN D) 250-200 MG-UNIT TABS 2 tabs by mouth twice daily 120 each    docusate sodium (COLACE) 100 MG capsule Take 1 capsule (100 mg total) by mouth 2 (two) times daily. 10 capsule 0   dorzolamide (TRUSOPT) 2 % ophthalmic solution 1 drop 2 (two) times daily.     glucose 4 GM chewable tablet Chew 1 tablet (4 g total) by mouth as needed for low blood sugar. 50 tablet 12   glucose blood (ONETOUCH ULTRA) test strip USE TO CHECK CBGS. USE TO CHECK DAILY IN THE MORNING FASTING, 2 HOURS AFTER EATING, AND IF HAVING HYPER OR HYPOGLYCEMIC SYMPTOMS. 400 each 3   hydrOXYzine (VISTARIL) 25 MG capsule TAKE 1 CAPSULE BY MOUTH AT BEDTIME AS NEEDED FOR SLEEP 30  capsule 0   latanoprost (XALATAN) 0.005 % ophthalmic solution INSTILL 1 DROP INTO EACH EYE ONCE DAILY IN THE EVENING     metFORMIN (GLUCOPHAGE) 1000 MG tablet TAKE 1 TABLET BY MOUTH TWICE DAILY WITH A MEAL 180 tablet 0   mometasone (NASONEX) 50 MCG/ACT nasal spray 2 sprays each nostril daily 17 g 5   Olopatadine HCl (PATADAY) 0.2 % SOLN Apply 1 drop to eye 2 (two) times daily. 2.5 mL 2   cetirizine (EQ ALLERGY RELIEF, CETIRIZINE,) 10 MG tablet Take 1 tablet by mouth once daily 90 tablet 3   ibandronate (BONIVA) 150 MG tablet Take 1 tablet (150 mg total) by mouth every 30 (thirty) days. Take in the morning with a full glass of water, on an empty stomach, and  do not take anything else by mouth or lie down for the next 30 min. 3 tablet 3   insulin glargine (LANTUS SOLOSTAR) 100 UNIT/ML Solostar Pen Inject 22 Units into the skin at bedtime. 15 mL 6   pantoprazole (PROTONIX) 20 MG tablet TAKE 1 TABLET BY MOUTH ONCE DAILY AS NEEDED FOR HEARTBURN 90 tablet 3   TRULICITY 3 MG/0.5ML SOAJ      Continuous Blood Gluc Receiver (DEXCOM G7 RECEIVER) DEVI To display CGM data (Patient not taking: Reported on 07/11/2023) 1 each 0   Continuous Blood Gluc Sensor (DEXCOM G7 SENSOR) MISC 1 Device by Does not apply route as directed. Change sensor every 10 days (Patient not taking: Reported on 07/11/2023) 3 each 3   cyclobenzaprine (FLEXERIL) 5 MG tablet Take 1 tablet (5 mg total) by mouth 3 (three) times daily as needed for muscle spasms. (Patient not taking: Reported on 07/11/2023) 30 tablet 1   No facility-administered medications prior to visit.   No Known Allergies Objective:   Today's Vitals   07/11/23 1300  BP: 118/66  Pulse: 88  Temp: 97.9 F (36.6 C)  TempSrc: Temporal  SpO2: 98%  Weight: 142 lb 3.2 oz (64.5 kg)  Height: 5' (1.524 m)   Body mass index is 27.77 kg/m.   General: Well developed, well nourished. No acute distress. Psych: Alert and oriented. Normal mood and affect.  Health Maintenance Due  Topic Date Due   Medicare Annual Wellness (AWV)  Never done   DTaP/Tdap/Td (1 - Tdap) Never done     Assessment & Plan:   Problem List Items Addressed This Visit       Respiratory   Seasonal allergic rhinitis due to pollen    Stable. Continue cetirizine 10 mg daily as needed.      Relevant Medications   cetirizine (EQ ALLERGY RELIEF, CETIRIZINE,) 10 MG tablet     Digestive   Gastroesophageal reflux disease    Stable. I will renew her pantoprazole 20 mg daily.      Relevant Medications   pantoprazole (PROTONIX) 20 MG tablet     Endocrine   Type 2 diabetes mellitus with hyperglycemia, with long-term current use of insulin (HCC) -  Primary (Chronic)    I will reassess her A1c today. She will continue Lantus 22 units daily, metformin 1,000 mg bid, and dulaglutide (Trulicity) 3 mg weekly.      Relevant Medications   TRULICITY 3 MG/0.5ML SOAJ   insulin glargine (LANTUS SOLOSTAR) 100 UNIT/ML Solostar Pen   Other Relevant Orders   Glucose, random   Hemoglobin A1c     Musculoskeletal and Integument   Osteoporosis    Continue ibandronate (Boniva) 150 mg monthly. Bone density will be due  in March 2025.      Relevant Medications   ibandronate (BONIVA) 150 MG tablet     Other   Hyperlipidemia    Lipids are at goal. Continue atorvastatin 80 mg daily.       Return in about 3 months (around 10/09/2023) for Reassessment.   Loyola Mast, MD

## 2023-07-11 NOTE — Assessment & Plan Note (Signed)
Stable.  Continue cetirizine 10 mg daily as needed.

## 2023-07-11 NOTE — Assessment & Plan Note (Signed)
Lipids are at goal. Continue atorvastatin 80 mg daily.

## 2023-07-11 NOTE — Assessment & Plan Note (Signed)
I will reassess her A1c today. She will continue Lantus 22 units daily, metformin 1,000 mg bid, and dulaglutide (Trulicity) 3 mg weekly.

## 2023-07-11 NOTE — Assessment & Plan Note (Signed)
Continue ibandronate (Boniva) 150 mg monthly. Bone density will be due in March 2025.

## 2023-07-11 NOTE — Assessment & Plan Note (Signed)
Stable. I will renew her pantoprazole 20 mg daily.

## 2023-07-18 LAB — HM DIABETES EYE EXAM

## 2023-07-19 ENCOUNTER — Encounter: Payer: Self-pay | Admitting: Family Medicine

## 2023-09-20 ENCOUNTER — Other Ambulatory Visit: Payer: Self-pay | Admitting: Family Medicine

## 2023-09-20 DIAGNOSIS — E1165 Type 2 diabetes mellitus with hyperglycemia: Secondary | ICD-10-CM

## 2023-09-22 ENCOUNTER — Other Ambulatory Visit: Payer: Self-pay | Admitting: Family

## 2023-09-22 DIAGNOSIS — E1165 Type 2 diabetes mellitus with hyperglycemia: Secondary | ICD-10-CM

## 2023-10-01 ENCOUNTER — Other Ambulatory Visit: Payer: Self-pay

## 2023-10-01 DIAGNOSIS — E1165 Type 2 diabetes mellitus with hyperglycemia: Secondary | ICD-10-CM

## 2023-10-01 MED ORDER — METFORMIN HCL 1000 MG PO TABS: 1000.0000 mg | ORAL_TABLET | Freq: Two times a day (BID) | ORAL | 0 refills | Status: DC

## 2023-10-02 ENCOUNTER — Ambulatory Visit: Payer: Medicare Other | Admitting: "Endocrinology

## 2023-10-02 ENCOUNTER — Encounter: Payer: Self-pay | Admitting: "Endocrinology

## 2023-10-02 VITALS — BP 103/64 | HR 83 | Ht 60.0 in | Wt 139.8 lb

## 2023-10-02 DIAGNOSIS — E042 Nontoxic multinodular goiter: Secondary | ICD-10-CM | POA: Diagnosis not present

## 2023-10-02 DIAGNOSIS — M810 Age-related osteoporosis without current pathological fracture: Secondary | ICD-10-CM | POA: Diagnosis not present

## 2023-10-02 DIAGNOSIS — Z794 Long term (current) use of insulin: Secondary | ICD-10-CM | POA: Diagnosis not present

## 2023-10-02 DIAGNOSIS — Z7985 Long-term (current) use of injectable non-insulin antidiabetic drugs: Secondary | ICD-10-CM | POA: Diagnosis not present

## 2023-10-02 DIAGNOSIS — E11649 Type 2 diabetes mellitus with hypoglycemia without coma: Secondary | ICD-10-CM

## 2023-10-02 LAB — POCT GLYCOSYLATED HEMOGLOBIN (HGB A1C): Hemoglobin A1C: 8 % — AB (ref 4.0–5.6)

## 2023-10-02 NOTE — Patient Instructions (Signed)

## 2023-10-02 NOTE — Progress Notes (Signed)
 Patient ID: Alyssa Weaver, female   DOB: 09-22-1950, 73 y.o.   MRN: [id number]                  Reason for Appointment: Endocrinology follow-up  Chief complaint: None    History of Present Illness:   History of goiter: Patient was initially evaluated for endocrinology management because of abnormal TSH in 2018 checked by her PCP She did not have any symptoms at that time of weight loss, palpitations, weakness Did not have any history of neck swelling or difficulty swallowing  Her highest baseline free T4 was only about 1.4 Free T3 has not been higher than 3.2  However she was initially given methimazole in 2020 and subsequently had been on PTU She did not feel any different with starting the PTU On her initial visit in 7/23 she was taking only 50 mg PTU once a day  With stopping her PTU her thyroid functions were nearly the same with low normal TSH  Currently denies palpitations/weight loss/heat intolerance/hyperdefecation Not on any thyroid medication  Wt Readings from Last 3 Encounters:  10/02/23 139 lb 12.8 oz (63.4 kg)  07/11/23 142 lb 3.2 oz (64.5 kg)  06/04/23 141 lb 9.6 oz (64.2 kg)    Thyroid function tests as follows:     Baseline TSH 0.135 in 2018, was 0.2 in 2019  Lab Results  Component Value Date   FREET4 0.78 05/16/2023   FREET4 0.92 04/17/2022   FREET4 0.96 02/09/2022   T3FREE 2.8 07/19/2018   T3FREE 2.6 03/06/2018   T3FREE 2.5 01/07/2018   TSH 0.29 (L) 05/16/2023   TSH 0.36 04/17/2022   TSH 0.46 02/09/2022   Lab Results  Component Value Date   FREET4 0.78 05/16/2023   FREET4 0.92 04/17/2022   FREET4 0.96 02/09/2022   FREET4 0.72 09/27/2021   FREET4 0.82 05/23/2021   FREET4 0.82 01/25/2021   FREET4 0.98 10/25/2020   FREET4 1.30 05/03/2020   FREET4 0.98 07/01/2019    Lab Results  Component Value Date   THYROTRECAB <1.10 02/09/2022   THYROID nodules: She had an incidental thyroid nodule discovered on a CT scan and subsequently was sent  for ultrasound and thyroid biopsies  On: 01/23/2018 Heterogeneous complex multinodular thyroid.   Recommend biopsy of the 2 most suspicious nodules, nodule 7 and 8  Needle aspiration showed Bethesda category 2 cytology and no further follow-up recommended    No dysphagia/dysphonia/dyspnea  DIABETES: See review of systems  Allergies as of 10/02/2023   No Known Allergies      Medication List        Accurate as of October 02, 2023  1:26 PM. If you have any questions, ask your nurse or doctor.          acetaminophen 650 MG CR tablet Commonly known as: Tylenol 8 Hour Take 1 tablet (650 mg total) by mouth every 8 (eight) hours as needed for pain.   AgaMatrix Presto w/Device Kit Twice daily sugar checks before meals   atorvastatin 80 MG tablet Commonly known as: LIPITOR Take 1 tablet by mouth once daily   Calcium Citrate-Vitamin D 250-200 MG-UNIT Tabs Commonly known as: Citracal/Vitamin D 2 tabs by mouth twice daily   cetirizine 10 MG tablet Commonly known as: EQ Allergy Relief (Cetirizine) Take 1 tablet (10 mg total) by mouth daily.   docusate sodium 100 MG capsule Commonly known as: Colace Take 1 capsule (100 mg total) by mouth 2 (two) times daily.   dorzolamide 2 %  ophthalmic solution Commonly known as: TRUSOPT 1 drop 2 (two) times daily.   glucose 4 GM chewable tablet Chew 1 tablet (4 g total) by mouth as needed for low blood sugar.   hydrOXYzine 25 MG capsule Commonly known as: VISTARIL TAKE 1 CAPSULE BY MOUTH AT BEDTIME AS NEEDED FOR SLEEP   ibandronate 150 MG tablet Commonly known as: BONIVA Take 1 tablet (150 mg total) by mouth every 30 (thirty) days. Take in the morning with a full glass of water, on an empty stomach, and do not take anything else by mouth or lie down for the next 30 min.   Lantus SoloStar 100 UNIT/ML Solostar Pen Generic drug: insulin glargine Inject 22 Units into the skin at bedtime. What changed: when to take this   latanoprost  0.005 % ophthalmic solution Commonly known as: XALATAN INSTILL 1 DROP INTO EACH EYE ONCE DAILY IN THE EVENING   metFORMIN 1000 MG tablet Commonly known as: GLUCOPHAGE Take 1 tablet (1,000 mg total) by mouth 2 (two) times daily with a meal.   mometasone 50 MCG/ACT nasal spray Commonly known as: Nasonex 2 sprays each nostril daily   Olopatadine HCl 0.2 % Soln Commonly known as: Pataday Apply 1 drop to eye 2 (two) times daily.   OneTouch Ultra test strip Generic drug: glucose blood USE TO CHECK CBGS. USE TO CHECK DAILY IN THE MORNING FASTING, 2 HOURS AFTER EATING, AND IF HAVING HYPER OR HYPOGLYCEMIC SYMPTOMS.   pantoprazole 20 MG tablet Commonly known as: PROTONIX TAKE 1 TABLET BY MOUTH ONCE DAILY AS NEEDED FOR HEARTBURN   Trulicity 3 MG/0.5ML Soaj Generic drug: Dulaglutide Inject 3 mg into the skin once a week.            Past Medical History:  Diagnosis Date   Allergy    Diabetes mellitus without complication (HCC)    GERD (gastroesophageal reflux disease)    Hyperlipidemia    Hypertension    Osteoporosis     Past Surgical History:  Procedure Laterality Date   APPENDECTOMY  1978   COLONOSCOPY     in [place]   TUBAL LIGATION  1985    Family History  Problem Relation Age of Onset   Asthma Brother    Thyroid disease Neg Hx    Osteoporosis Neg Hx    Colon cancer Neg Hx    Colon polyps Neg Hx    Stomach cancer Neg Hx    Esophageal cancer Neg Hx     Social History:  reports that she has been smoking cigarettes. She has a 10.8 pack-year smoking history. She has never used smokeless tobacco. She reports that she does not drink alcohol and does not use drugs.  Allergies: No Known Allergies   Review of Systems  DIABETES type II: She is on basal insulin using Lantus 22 units in a.m., metformin 1 g twice daily and Trulicity 3 mg weekly. Doesn't want to resume Trulicity/change to [facility] because feels no difference in BG /Weight with Trulicity.  She refused to  consider the Dexcom that was recommended previously  Her blood sugars at home appear to be much lower than expected for her A1c She was referred for nutritional counseling previously but she did not make an appointment One Touch ultra 2 Blood sugar readings  BG log: 80-151, checks tidac  Weight history:  Wt Readings from Last 3 Encounters:  10/02/23 139 lb 12.8 oz (63.4 kg)  07/11/23 142 lb 3.2 oz (64.5 kg)  06/04/23 141 lb 9.6 oz (  64.2 kg)    Lab Results  Component Value Date   HGBA1C 8.0 (A) 10/02/2023   HGBA1C 8.8 (H) 07/11/2023   HGBA1C 8.4 (H) 04/11/2023   Lab Results  Component Value Date   MICROALBUR <0.7 01/09/2023   LDLCALC 26 01/09/2023   CREATININE 0.77 05/16/2023    OSTEOPOROSIS: She was started on Boniva after her screening bone density in 05/2019 showed osteoporosis at the spine with T score -2.5 She takes this regularly without side effect Last vitamin D level also normal  Bone density results: T-score AP Spine L1-L4 10/27/2021 69.4 -1.9 0.947 g/cm2 * AP Spine  L1-L4      05/13/2019    66.9         -2.5    0.882 g/cm2   DualFemur Neck Left  10/27/2021    69.4         -1.3    0.860 g/cm2 DualFemur Neck Left  05/13/2019    66.9         -1.5    0.828 g/cm2   DualFemur Total Mean 10/27/2021    69.4         -0.3    0.970 g/cm2 DualFemur Total Mean 05/13/2019    66.9         -0.4    0.961 g/cm2  On calcium, Vit D and Boniva monthly since 3 years    Examination:   BP 103/64 (BP Location: Right Arm, Patient Position: Sitting)   Pulse 83   Ht 5' (1.524 m)   Wt 139 lb 12.8 oz (63.4 kg)   SpO2 97%   BMI 27.30 kg/m   Firm slightly nodular right thyroid lobe is palpable medially mostly on swallowing and about twice normal Left side not palpable  Assessment/Plan:  DIABETES type II No history of MI/stroke/neuropathy/retinopathy/nephropathy Decrease Lantus to 20 units in a.m., continue metformin 1 g twice daily, Trulicity 3 mg/week Not keen on CGM/seeing  nutritionist for diabetes education Treat corn on right foot  Multinodular goiter She had an incidental thyroid nodule discovered on a CT scan and subsequently was sent for ultrasound and thyroid biopsies On: 01/23/2018 Heterogeneous complex multinodular thyroid. Recommend biopsy of the 2 most suspicious nodules, nodule 7 and 8 Needle aspiration showed Bethesda category 2 cytology  Order f/u thyroid U/S next visit   Subclinical hyperthyroidism She has a history of an autonomously functioning thyroid noted to multinodular goiter. Doesn't need any thyroid medication  Has been euthyroid without her low-dose PTU regimen and thyroid levels can be checked periodically  Osteoporosis Repeat DXA 10/2023 Continue Calcium, Vit D every day Continue Boniva 150 mg monthly  No current issues reported with her jaws  Patient Instructions   Goals of DM therapy:  Morning Fasting blood sugar: 80-140  Blood sugar before meals: 80-140 Bed time blood sugar: 100-150  A1C <7%, limited only by hypoglycemia  1.Diabetes medications and their side effects discussed, including hypoglycemia    2. Check blood glucose:  a) Always check blood sugars before driving. Please see below (under hypoglycemia) on how to manage b) Check a minimum of 3 times/day or more as needed when having symptoms of hypoglycemia.   c) Try to check blood glucose before sleeping/in the middle of the night to ensure that it is remaining stable and not dropping less than 100 d) Check blood glucose more often if sick  3. Diet: a) 3 meals per day schedule b: Restrict carbs to 60-70 grams (4 servings) per meal c) Colorful vegetables -  3 servings a day, and low sugar fruit 2 servings/day Plate control method: 1/4 plate protein, 1/4 starch, 1/2 green, yellow, or red vegetables d) Avoid carbohydrate snacks unless hypoglycemic episode, or increased physical activity  4. Regular exercise as tolerated, preferably 3 or more hours a week  5.  Hypoglycemia: a)  Do not drive or operate machinery without first testing blood glucose to assure it is over 90 mg%, or if dizzy, lightheaded, not feeling normal, etc, or  if foot or leg is numb or weak. b)  If blood glucose less than 70, take four 5gm Glucose tabs or 15-30 gm Glucose gel.  Repeat every 15 min as needed until blood sugar is >100 mg/dl. If hypoglycemia persists then call 911.   6. Sick day management: a) Check blood glucose more often b) Continue usual therapy if blood sugars are elevated.   7. Contact the doctor immediately if blood glucose is frequently <60 mg/dl, or an episode of severe hypoglycemia occurs (where someone had to give you glucose/  glucagon or if you passed out from a low blood glucose), or if blood glucose is persistently >350 mg/dl, for further management  8. A change in level of physical activity or exercise and a change in diet may also affect your blood sugar. Check blood sugars more often and call if needed.  Instructions: 1. Bring glucose meter, blood glucose records on every visit for review 2. Continue to follow up with primary care physician and other providers for medical care 3. Yearly eye  and foot exam 4. Please get blood work done prior to the next appointment    Altamese Latrobe 10/02/2023, 1:26 PM    Note: This office note was prepared with Dragon voice recognition system technology. Any transcriptional errors that result from this process are unintentional.

## 2023-10-09 ENCOUNTER — Ambulatory Visit: Payer: Medicare Other | Admitting: Family Medicine

## 2023-10-23 ENCOUNTER — Telehealth: Payer: Self-pay | Admitting: Family Medicine

## 2023-10-23 NOTE — Telephone Encounter (Signed)
 10/09/2023 no show, 1st missed visit, letter sent via mail in [place] & [place]

## 2023-11-21 DIAGNOSIS — H401132 Primary open-angle glaucoma, bilateral, moderate stage: Secondary | ICD-10-CM | POA: Diagnosis not present

## 2023-12-17 ENCOUNTER — Ambulatory Visit (INDEPENDENT_AMBULATORY_CARE_PROVIDER_SITE_OTHER): Admitting: Family Medicine

## 2023-12-17 ENCOUNTER — Encounter: Payer: Self-pay | Admitting: Family Medicine

## 2023-12-17 VITALS — BP 118/62 | HR 78 | Temp 97.7°F | Ht 60.0 in | Wt 139.7 lb

## 2023-12-17 DIAGNOSIS — Z794 Long term (current) use of insulin: Secondary | ICD-10-CM | POA: Diagnosis not present

## 2023-12-17 DIAGNOSIS — Z7984 Long term (current) use of oral hypoglycemic drugs: Secondary | ICD-10-CM | POA: Diagnosis not present

## 2023-12-17 DIAGNOSIS — Z7985 Long-term (current) use of injectable non-insulin antidiabetic drugs: Secondary | ICD-10-CM | POA: Diagnosis not present

## 2023-12-17 DIAGNOSIS — E1165 Type 2 diabetes mellitus with hyperglycemia: Secondary | ICD-10-CM | POA: Diagnosis not present

## 2023-12-17 DIAGNOSIS — F17200 Nicotine dependence, unspecified, uncomplicated: Secondary | ICD-10-CM | POA: Diagnosis not present

## 2023-12-17 DIAGNOSIS — E782 Mixed hyperlipidemia: Secondary | ICD-10-CM | POA: Diagnosis not present

## 2023-12-17 NOTE — Assessment & Plan Note (Signed)
I continue to encourage Alyssa Weaver to stop smoking.

## 2023-12-17 NOTE — Assessment & Plan Note (Signed)
 I will reassess her A1c today. She will continue Lantus  22 units daily, metformin  1,000 mg q am and 500 mg q pm, and dulaglutide  (Trulicity ) 3 mg weekly. I recommend she wait until she can review her regimen with Dr. Motwani before stopping any further meds.

## 2023-12-17 NOTE — Assessment & Plan Note (Signed)
Lipids are at goal. Continue atorvastatin 80 mg daily.

## 2023-12-17 NOTE — Progress Notes (Signed)
 [facility] [address] [place] [address] Dept: [phone] Dept Fax: [phone]  Chronic Care Office Visit  Subjective:    Patient ID: Alyssa Weaver, female    DOB: Feb 28, 1951, 73 y.o..   MRN: [id number]  Chief Complaint  Patient presents with   Diabetes    3 month f/u.     Medical Interpreter: Simally  History of Present Illness:  Patient is in today for reassessment of chronic medical issues.  Alyssa Weaver has a history of Type 2 diabetes. She is managed on insulin  glargine (Lantus ) 22 units daily, metformin  1,000 mg bid, dulaglutide  (Trulicity ) 3 mg injections weekly. She is now managed by Dr. Vertell Gory (endocrinology). She notes her fasting glucose today was 89, though she sometimes has sugars in the 70s. She has been worried about hypoglycemia. She notes she has started only taking half a tab of metformin  int he evening. She asksa bout being able to stop her Trulicity .   Alyssa Weaver has hyperlipidemia and is managed on atorvastatin  80 mg daily. She continues to smoke ~ 5 cigarettes a day.    Alyssa Weaver has a history of osteoporosis. She is managed on ibandronate  (Boniva ) 150 mg monthly (since 05/2019) and takes a daily calcium  + Vit. D supplement.  Past Medical History: Patient Active Problem List   Diagnosis Date Noted   Primary insomnia 01/09/2023   Cataract cortical, senile, bilateral 06/20/2022   Primary open angle glaucoma (POAG) of both eyes, moderate stage 03/14/2021   Gastroesophageal reflux disease 07/17/2019   Osteoporosis 05/20/2019   Multinodular thyroid  05/20/2019   Tobacco use disorder 01/07/2018   Seasonal allergic rhinitis due to pollen 01/07/2018   Environmental allergies    Type 2 diabetes mellitus with hyperglycemia, with long-term current use of insulin  (HCC) 01/04/2016   Hyperlipidemia 01/04/2016   Past Surgical History:  Procedure Laterality Date   APPENDECTOMY  1978    COLONOSCOPY     in [place]   TUBAL LIGATION  1985   Family History  Problem Relation Age of Onset   Asthma Brother    Thyroid  disease Neg Hx    Osteoporosis Neg Hx    Colon cancer Neg Hx    Colon polyps Neg Hx    Stomach cancer Neg Hx    Esophageal cancer Neg Hx    Outpatient Medications Prior to Visit  Medication Sig Dispense Refill   acetaminophen  (TYLENOL  8 HOUR) 650 MG CR tablet Take 1 tablet (650 mg total) by mouth every 8 (eight) hours as needed for pain. 20 tablet 0   atorvastatin  (LIPITOR) 80 MG tablet Take 1 tablet by mouth once daily 90 tablet 3   Blood Glucose Monitoring Suppl (AGAMATRIX PRESTO) w/Device KIT Twice daily sugar checks before meals 1 kit 0   Calcium  Citrate-Vitamin D  (CITRACAL/VITAMIN D ) 250-200 MG-UNIT TABS 2 tabs by mouth twice daily 120 each    cetirizine  (EQ ALLERGY RELIEF, CETIRIZINE ,) 10 MG tablet Take 1 tablet (10 mg total) by mouth daily. 90 tablet 3   docusate sodium  (COLACE) 100 MG capsule Take 1 capsule (100 mg total) by mouth 2 (two) times daily. 10 capsule 0   dorzolamide (TRUSOPT) 2 % ophthalmic solution 1 drop 2 (two) times daily.     glucose 4 GM chewable tablet Chew 1 tablet (4 g total) by mouth as needed for low blood sugar. 50 tablet 12   glucose blood (ONETOUCH ULTRA) test strip USE TO CHECK CBGS. USE TO CHECK DAILY IN THE MORNING  FASTING, 2 HOURS AFTER EATING, AND IF HAVING HYPER OR HYPOGLYCEMIC SYMPTOMS. 400 each 3   hydrOXYzine  (VISTARIL ) 25 MG capsule TAKE 1 CAPSULE BY MOUTH AT BEDTIME AS NEEDED FOR SLEEP 30 capsule 0   ibandronate  (BONIVA ) 150 MG tablet Take 1 tablet (150 mg total) by mouth every 30 (thirty) days. Take in the morning with a full glass of water, on an empty stomach, and do not take anything else by mouth or lie down for the next 30 min. 3 tablet 3   insulin  glargine (LANTUS  SOLOSTAR) 100 UNIT/ML Solostar Pen Inject 22 Units into the skin at bedtime. (Patient taking differently: Inject 22 Units into the skin in the morning.) 15 mL  6   latanoprost (XALATAN) 0.005 % ophthalmic solution INSTILL 1 DROP INTO EACH EYE ONCE DAILY IN THE EVENING     metFORMIN  (GLUCOPHAGE ) 1000 MG tablet Take 1 tablet (1,000 mg total) by mouth 2 (two) times daily with a meal. 180 tablet 0   mometasone  (NASONEX ) 50 MCG/ACT nasal spray 2 sprays each nostril daily 17 g 5   Olopatadine  HCl (PATADAY ) 0.2 % SOLN Apply 1 drop to eye 2 (two) times daily. 2.5 mL 2   pantoprazole  (PROTONIX ) 20 MG tablet TAKE 1 TABLET BY MOUTH ONCE DAILY AS NEEDED FOR HEARTBURN 90 tablet 3   TRULICITY  3 MG/0.5ML SOAJ Inject 3 mg into the skin once a week. 6 mL 3   No facility-administered medications prior to visit.   No Known Allergies Objective:   Today's Vitals   12/17/23 1458  BP: 118/62  Pulse: 78  Temp: 97.7 F (36.5 C)  TempSrc: Temporal  SpO2: 97%  Weight: 139 lb 11.2 oz (63.4 kg)  Height: 5' (1.524 m)   Body mass index is 27.28 kg/m.   General: Well developed, well nourished. No acute distress. CV: RRR without murmurs or rubs. Pulses 2+ bilaterally. Psych: Alert and oriented. Normal mood and affect.  Health Maintenance Due  Topic Date Due   Medicare Annual Wellness (AWV)  Never done   DTaP/Tdap/Td (1 - Tdap) Never done   Diabetic kidney evaluation - Urine ACR  01/09/2024     Assessment & Plan:   Problem List Items Addressed This Visit       Endocrine   Type 2 diabetes mellitus with hyperglycemia, with long-term current use of insulin  (HCC) - Primary (Chronic)   I will reassess her A1c today. She will continue Lantus  22 units daily, metformin  1,000 mg q am and 500 mg q pm, and dulaglutide  (Trulicity ) 3 mg weekly. I recommend she wait until she can review her regimen with Dr. Motwani before stopping any further meds.      Relevant Orders   Glucose, random   Hemoglobin A1c     Other   Hyperlipidemia   Lipids are at goal. Continue atorvastatin  80 mg daily.      Tobacco use disorder   I continue to encourage Alyssa Weaver to stop  smoking.       Return in about 6 months (around 06/18/2024) for Reassessment.   Graig Lawyer, MD

## 2023-12-18 ENCOUNTER — Ambulatory Visit: Payer: Self-pay | Admitting: Family Medicine

## 2023-12-18 LAB — HEMOGLOBIN A1C: Hgb A1c MFr Bld: 8.4 % — ABNORMAL HIGH (ref 4.6–6.5)

## 2023-12-18 LAB — GLUCOSE, RANDOM: Glucose, Bld: 161 mg/dL — ABNORMAL HIGH (ref 70–99)

## 2023-12-27 ENCOUNTER — Other Ambulatory Visit: Payer: Self-pay | Admitting: Family Medicine

## 2023-12-27 DIAGNOSIS — E1165 Type 2 diabetes mellitus with hyperglycemia: Secondary | ICD-10-CM

## 2023-12-31 ENCOUNTER — Encounter: Payer: Self-pay | Admitting: "Endocrinology

## 2023-12-31 ENCOUNTER — Ambulatory Visit: Admitting: "Endocrinology

## 2023-12-31 VITALS — BP 104/80 | HR 86 | Ht 60.0 in | Wt 139.0 lb

## 2023-12-31 DIAGNOSIS — M810 Age-related osteoporosis without current pathological fracture: Secondary | ICD-10-CM

## 2023-12-31 DIAGNOSIS — E0590 Thyrotoxicosis, unspecified without thyrotoxic crisis or storm: Secondary | ICD-10-CM

## 2023-12-31 DIAGNOSIS — Z7984 Long term (current) use of oral hypoglycemic drugs: Secondary | ICD-10-CM | POA: Diagnosis not present

## 2023-12-31 DIAGNOSIS — E042 Nontoxic multinodular goiter: Secondary | ICD-10-CM | POA: Diagnosis not present

## 2023-12-31 DIAGNOSIS — E119 Type 2 diabetes mellitus without complications: Secondary | ICD-10-CM

## 2023-12-31 DIAGNOSIS — Z794 Long term (current) use of insulin: Secondary | ICD-10-CM | POA: Diagnosis not present

## 2023-12-31 DIAGNOSIS — Z7985 Long-term (current) use of injectable non-insulin antidiabetic drugs: Secondary | ICD-10-CM | POA: Diagnosis not present

## 2023-12-31 LAB — POCT GLYCOSYLATED HEMOGLOBIN (HGB A1C): Hemoglobin A1C: 8.2 % — AB (ref 4.0–5.6)

## 2023-12-31 NOTE — Progress Notes (Signed)
 Patient ID: Alyssa Weaver, female   DOB: [birth year], 73 y.o.   MRN: [id number]                  Reason for Appointment: Endocrinology follow-up  Chief complaint: None    History of Present Illness:   History of goiter: Patient was initially evaluated for endocrinology management because of abnormal TSH in 2018 checked by her PCP She did not have any symptoms at that time of weight loss, palpitations, weakness Did not have any history of neck swelling or difficulty swallowing  Her highest baseline free T4 was only about 1.4 Free T3 has not been higher than 3.2  However she was initially given methimazole  in 2020 and subsequently had been on PTU She did not feel any different with starting the PTU On her initial visit in 7/23 she was taking only 50 mg PTU once a day  With stopping her PTU her thyroid  functions were nearly the same with low normal TSH  No reported concerns  Not on any thyroid  medication  Wt Readings from Last 3 Encounters:  12/31/23 139 lb (63 kg)  12/17/23 139 lb 11.2 oz (63.4 kg)  10/02/23 139 lb 12.8 oz (63.4 kg)    Thyroid  function tests as follows:     Baseline TSH 0.135 in 2018, was 0.2 in 2019  Lab Results  Component Value Date   FREET4 0.78 05/16/2023   FREET4 0.92 04/17/2022   FREET4 0.96 02/09/2022   T3FREE 2.8 07/19/2018   T3FREE 2.6 03/06/2018   T3FREE 2.5 01/07/2018   TSH 0.29 (L) 05/16/2023   TSH 0.36 04/17/2022   TSH 0.46 02/09/2022   Lab Results  Component Value Date   FREET4 0.78 05/16/2023   FREET4 0.92 04/17/2022   FREET4 0.96 02/09/2022   FREET4 0.72 09/27/2021   FREET4 0.82 05/23/2021   FREET4 0.82 01/25/2021   FREET4 0.98 10/25/2020   FREET4 1.30 05/03/2020   FREET4 0.98 07/01/2019    Lab Results  Component Value Date   THYROTRECAB <1.10 02/09/2022   THYROID  nodules: She had an incidental thyroid  nodule discovered on a CT scan and subsequently was sent for ultrasound and thyroid  biopsies  On:  01/23/2018 Heterogeneous complex multinodular thyroid .   Recommend biopsy of the 2 most suspicious nodules, nodule 7 and 8  Needle aspiration showed Bethesda category 2 cytology and no further follow-up recommended  Did not do thyroid  U/S  DIABETES: See review of systems  Allergies as of 12/31/2023   No Known Allergies      Medication List        Accurate as of December 31, 2023  2:41 PM. If you have any questions, ask your nurse or doctor.          acetaminophen  650 MG CR tablet Commonly known as: Tylenol  8 Hour Take 1 tablet (650 mg total) by mouth every 8 (eight) hours as needed for pain.   AgaMatrix Presto w/Device Kit Twice daily sugar checks before meals   atorvastatin  80 MG tablet Commonly known as: LIPITOR Take 1 tablet by mouth once daily   Calcium  Citrate-Vitamin D  250-200 MG-UNIT Tabs Commonly known as: Citracal/Vitamin D  2 tabs by mouth twice daily   cetirizine  10 MG tablet Commonly known as: EQ Allergy Relief (Cetirizine ) Take 1 tablet (10 mg total) by mouth daily.   docusate sodium  100 MG capsule Commonly known as: Colace Take 1 capsule (100 mg total) by mouth 2 (two) times daily.   dorzolamide 2 % ophthalmic solution  Commonly known as: TRUSOPT 1 drop 2 (two) times daily.   glucose 4 GM chewable tablet Chew 1 tablet (4 g total) by mouth as needed for low blood sugar.   hydrOXYzine  25 MG capsule Commonly known as: VISTARIL  TAKE 1 CAPSULE BY MOUTH AT BEDTIME AS NEEDED FOR SLEEP   ibandronate  150 MG tablet Commonly known as: BONIVA  Take 1 tablet (150 mg total) by mouth every 30 (thirty) days. Take in the morning with a full glass of water, on an empty stomach, and do not take anything else by mouth or lie down for the next 30 min.   Lantus  SoloStar 100 UNIT/ML Solostar Pen Generic drug: insulin  glargine Inject 22 Units into the skin at bedtime. What changed: when to take this   latanoprost 0.005 % ophthalmic solution Commonly known as:  XALATAN INSTILL 1 DROP INTO EACH EYE ONCE DAILY IN THE EVENING   metFORMIN  1000 MG tablet Commonly known as: GLUCOPHAGE  TAKE 1 TABLET BY MOUTH TWICE DAILY WITH MEALS   mometasone  50 MCG/ACT nasal spray Commonly known as: Nasonex  2 sprays each nostril daily   Olopatadine  HCl 0.2 % Soln Commonly known as: Pataday  Apply 1 drop to eye 2 (two) times daily.   OneTouch Ultra test strip Generic drug: glucose blood USE TO CHECK CBGS. USE TO CHECK DAILY IN THE MORNING FASTING, 2 HOURS AFTER EATING, AND IF HAVING HYPER OR HYPOGLYCEMIC SYMPTOMS.   pantoprazole  20 MG tablet Commonly known as: PROTONIX  TAKE 1 TABLET BY MOUTH ONCE DAILY AS NEEDED FOR HEARTBURN   Trulicity  3 MG/0.5ML Soaj Generic drug: Dulaglutide  Inject 3 mg into the skin once a week.            Past Medical History:  Diagnosis Date   Allergy    Diabetes mellitus without complication (HCC)    GERD (gastroesophageal reflux disease)    Hyperlipidemia    Hypertension    Osteoporosis     Past Surgical History:  Procedure Laterality Date   APPENDECTOMY  1978   COLONOSCOPY     in [place]   TUBAL LIGATION  1985    Family History  Problem Relation Age of Onset   Asthma Brother    Thyroid  disease Neg Hx    Osteoporosis Neg Hx    Colon cancer Neg Hx    Colon polyps Neg Hx    Stomach cancer Neg Hx    Esophageal cancer Neg Hx     Social History:  reports that she has been smoking cigarettes. She has a 10.8 pack-year smoking history. She has never used smokeless tobacco. She reports that she does not drink alcohol and does not use drugs.  Allergies: No Known Allergies   Review of Systems  DIABETES type II: She is on basal insulin  using Lantus  22 units in a.m., metformin  1 g twice daily and Trulicity  3 mg weekly. Doesn't want to resume Trulicity /change to [facility] because feels no difference in BG /Weight with Trulicity .  She refused to consider the Dexcom that was recommended previously  Her blood sugars at home  appear to be much lower than expected for her A1c She was referred for nutritional counseling previously but she did not make an appointment One Touch ultra 2 Blood sugar readings  BG log: 81-140, checks tidac  Weight history:  Wt Readings from Last 3 Encounters:  12/31/23 139 lb (63 kg)  12/17/23 139 lb 11.2 oz (63.4 kg)  10/02/23 139 lb 12.8 oz (63.4 kg)    Lab Results  Component Value  Date   HGBA1C 8.2 (A) 12/31/2023   HGBA1C 8.4 (H) 12/17/2023   HGBA1C 8.0 (A) 10/02/2023   Lab Results  Component Value Date   MICROALBUR <0.7 01/09/2023   LDLCALC 26 01/09/2023   CREATININE 0.77 05/16/2023    OSTEOPOROSIS: She was started on Boniva  after her screening bone density in 05/2019 showed osteoporosis at the spine with T score -2.5 She takes this regularly without side effect Last vitamin D  level also normal  Bone density results: T-score AP Spine L1-L4 10/27/2021 69.4 -1.9 0.947 g/cm2 * AP Spine  L1-L4      05/13/2019    66.9         -2.5    0.882 g/cm2   DualFemur Neck Left  10/27/2021    69.4         -1.3    0.860 g/cm2 DualFemur Neck Left  05/13/2019    66.9         -1.5    0.828 g/cm2   DualFemur Total Mean 10/27/2021    69.4         -0.3    0.970 g/cm2 DualFemur Total Mean 05/13/2019    66.9         -0.4    0.961 g/cm2  On calcium , Vit D and Boniva  monthly since 3 years    Examination:   BP 104/80   Pulse 86   Ht 5' (1.524 m)   Wt 139 lb (63 kg)   SpO2 96%   BMI 27.15 kg/m   Firm slightly nodular right thyroid  lobe is palpable medially mostly on swallowing and about twice normal Left side not palpable  Assessment/Plan:  DIABETES type II No history of MI/stroke/neuropathy/retinopathy/nephropathy Continue Lantus  to 20 units in a.m., continue metformin  1 g twice daily, Trulicity  3 mg/week 12/31/23 DENIES JARDIANCE to see if that can help her come off of insulin   Not keen on CGM/seeing nutritionist for diabetes education Treat corn on right  foot  Multinodular goiter She had an incidental thyroid  nodule discovered on a CT scan and subsequently was sent for ultrasound and thyroid  biopsies On: 01/23/2018 Heterogeneous complex multinodular thyroid . Recommend biopsy of the 2 most suspicious nodules, nodule 7 and 8 Needle aspiration showed Bethesda category 2 cytology 12/31/23: f/u thyroid  U/S not done, reordered   Subclinical hyperthyroidism She has a history of an autonomously functioning thyroid  noted to multinodular goiter. Doesn't need any thyroid  medication  Has been euthyroid without her low-dose PTU regimen and thyroid  levels can be checked periodically  Osteoporosis 12/31/23: Ordered Repeat DXA Continue Calcium , Vit D every day Continue Boniva  150 mg monthly  No current issues reported with her jaws  Patient Instructions   Goals of DM therapy:  Morning Fasting blood sugar: 80-140  Blood sugar before meals: 80-140 Bed time blood sugar: 100-150  A1C <7%, limited only by hypoglycemia  1.Diabetes medications and their side effects discussed, including hypoglycemia    2. Check blood glucose:  a) Always check blood sugars before driving. Please see below (under hypoglycemia) on how to manage b) Check a minimum of 3 times/day or more as needed when having symptoms of hypoglycemia.   c) Try to check blood glucose before sleeping/in the middle of the night to ensure that it is remaining stable and not dropping less than 100 d) Check blood glucose more often if sick  3. Diet: a) 3 meals per day schedule b: Restrict carbs to 60-70 grams (4 servings) per meal c) Colorful vegetables - 3 servings  a day, and low sugar fruit 2 servings/day Plate control method: 1/4 plate protein, 1/4 starch, 1/2 green, yellow, or red vegetables d) Avoid carbohydrate snacks unless hypoglycemic episode, or increased physical activity  4. Regular exercise as tolerated, preferably 3 or more hours a week  5. Hypoglycemia: a)  Do not drive or  operate machinery without first testing blood glucose to assure it is over 90 mg%, or if dizzy, lightheaded, not feeling normal, etc, or  if foot or leg is numb or weak. b)  If blood glucose less than 70, take four 5gm Glucose tabs or 15-30 gm Glucose gel.  Repeat every 15 min as needed until blood sugar is >100 mg/dl. If hypoglycemia persists then call 911.   6. Sick day management: a) Check blood glucose more often b) Continue usual therapy if blood sugars are elevated.   7. Contact the doctor immediately if blood glucose is frequently <60 mg/dl, or an episode of severe hypoglycemia occurs (where someone had to give you glucose/  glucagon or if you passed out from a low blood glucose), or if blood glucose is persistently >350 mg/dl, for further management  8. A change in level of physical activity or exercise and a change in diet may also affect your blood sugar. Check blood sugars more often and call if needed.  Instructions: 1. Bring glucose meter, blood glucose records on every visit for review 2. Continue to follow up with primary care physician and other providers for medical care 3. Yearly eye  and foot exam 4. Please get blood work done prior to the next appointment    Jorge Newcomer 12/31/2023, 2:41 PM    Note: This office note was prepared with Dragon voice recognition system technology. Any transcriptional errors that result from this process are unintentional.

## 2023-12-31 NOTE — Patient Instructions (Signed)

## 2024-01-22 DIAGNOSIS — H31092 Other chorioretinal scars, left eye: Secondary | ICD-10-CM | POA: Diagnosis not present

## 2024-01-22 DIAGNOSIS — H524 Presbyopia: Secondary | ICD-10-CM | POA: Diagnosis not present

## 2024-01-22 DIAGNOSIS — E119 Type 2 diabetes mellitus without complications: Secondary | ICD-10-CM | POA: Diagnosis not present

## 2024-01-22 DIAGNOSIS — H401132 Primary open-angle glaucoma, bilateral, moderate stage: Secondary | ICD-10-CM | POA: Diagnosis not present

## 2024-01-22 DIAGNOSIS — H25813 Combined forms of age-related cataract, bilateral: Secondary | ICD-10-CM | POA: Diagnosis not present

## 2024-01-22 LAB — HM DIABETES EYE EXAM

## 2024-01-23 ENCOUNTER — Encounter: Payer: Self-pay | Admitting: Family Medicine

## 2024-01-23 DIAGNOSIS — H401190 Primary open-angle glaucoma, unspecified eye, stage unspecified: Secondary | ICD-10-CM | POA: Insufficient documentation

## 2024-03-03 DIAGNOSIS — Z1231 Encounter for screening mammogram for malignant neoplasm of breast: Secondary | ICD-10-CM | POA: Diagnosis not present

## 2024-03-03 LAB — HM MAMMOGRAPHY

## 2024-03-05 ENCOUNTER — Encounter: Payer: Self-pay | Admitting: Family Medicine

## 2024-03-10 ENCOUNTER — Ambulatory Visit (INDEPENDENT_AMBULATORY_CARE_PROVIDER_SITE_OTHER)
Admission: RE | Admit: 2024-03-10 | Discharge: 2024-03-10 | Disposition: A | Discharge status: Home / Self Care | Source: Ambulatory Visit | Attending: "Endocrinology | Admitting: "Endocrinology

## 2024-03-10 ENCOUNTER — Other Ambulatory Visit

## 2024-03-10 DIAGNOSIS — M810 Age-related osteoporosis without current pathological fracture: Secondary | ICD-10-CM | POA: Diagnosis not present

## 2024-03-17 ENCOUNTER — Other Ambulatory Visit: Payer: Self-pay

## 2024-03-26 ENCOUNTER — Other Ambulatory Visit

## 2024-03-26 DIAGNOSIS — M810 Age-related osteoporosis without current pathological fracture: Secondary | ICD-10-CM | POA: Diagnosis not present

## 2024-03-26 DIAGNOSIS — Z794 Long term (current) use of insulin: Secondary | ICD-10-CM | POA: Diagnosis not present

## 2024-03-27 LAB — COMPREHENSIVE METABOLIC PANEL WITH GFR
AG Ratio: 2 (calc) (ref 1.0–2.5)
ALT: 28 U/L (ref 6–29)
AST: 26 U/L (ref 10–35)
Albumin: 4.5 g/dL (ref 3.6–5.1)
Alkaline phosphatase (APISO): 44 U/L (ref 37–153)
BUN/Creatinine Ratio: 21 (calc) (ref 6–22)
BUN: 12 mg/dL (ref 7–25)
CO2: 31 mmol/L (ref 20–32)
Calcium: 9.3 mg/dL (ref 8.6–10.4)
Chloride: 104 mmol/L (ref 98–110)
Creat: 0.57 mg/dL — ABNORMAL LOW (ref 0.60–1.00)
Globulin: 2.3 g/dL (ref 1.9–3.7)
Glucose, Bld: 102 mg/dL — ABNORMAL HIGH (ref 65–99)
Potassium: 3.8 mmol/L (ref 3.5–5.3)
Sodium: 142 mmol/L (ref 135–146)
Total Bilirubin: 0.4 mg/dL (ref 0.2–1.2)
Total Protein: 6.8 g/dL (ref 6.1–8.1)
eGFR: 97 mL/min/1.73m2 (ref >=60)

## 2024-03-27 LAB — VITAMIN D 25 HYDROXY (VIT D DEFICIENCY, FRACTURES): Vit D, 25-Hydroxy: 61 ng/mL (ref 30–100)

## 2024-03-27 LAB — LIPID PANEL
Cholesterol: 92 mg/dL (ref <200)
HDL: 41 mg/dL — ABNORMAL LOW (ref >=50)
LDL Cholesterol (Calc): 30 mg/dL
Non-HDL Cholesterol (Calc): 51 mg/dL (ref <130)
Total CHOL/HDL Ratio: 2.2 (calc) (ref <5.0)
Triglycerides: 127 mg/dL (ref <150)

## 2024-03-27 LAB — TSH+FREE T4: TSH W/REFLEX TO FT4: 0.59 m[IU]/L (ref 0.40–4.50)

## 2024-04-01 ENCOUNTER — Encounter: Payer: Self-pay | Admitting: "Endocrinology

## 2024-04-01 ENCOUNTER — Ambulatory Visit: Admitting: "Endocrinology

## 2024-04-01 VITALS — BP 120/70 | HR 65 | Ht 60.0 in | Wt 138.0 lb

## 2024-04-01 DIAGNOSIS — E042 Nontoxic multinodular goiter: Secondary | ICD-10-CM

## 2024-04-01 DIAGNOSIS — E119 Type 2 diabetes mellitus without complications: Secondary | ICD-10-CM

## 2024-04-01 DIAGNOSIS — Z794 Long term (current) use of insulin: Secondary | ICD-10-CM

## 2024-04-01 DIAGNOSIS — M8589 Other specified disorders of bone density and structure, multiple sites: Secondary | ICD-10-CM

## 2024-04-01 DIAGNOSIS — Z7985 Long-term (current) use of injectable non-insulin antidiabetic drugs: Secondary | ICD-10-CM | POA: Diagnosis not present

## 2024-04-01 DIAGNOSIS — Z7984 Long term (current) use of oral hypoglycemic drugs: Secondary | ICD-10-CM | POA: Diagnosis not present

## 2024-04-01 LAB — POCT GLYCOSYLATED HEMOGLOBIN (HGB A1C): Hemoglobin A1C: 7.8 % — AB (ref 4.0–5.6)

## 2024-04-01 NOTE — Progress Notes (Signed)
 Patient ID: Alyssa Weaver, female   DOB: 1951-07-02, 73 y.o.   MRN: [id number]                  Reason for Appointment: Endocrinology follow-up  Chief complaint: None    History of Present Illness:   History of goiter: Patient was initially evaluated for endocrinology management because of abnormal TSH in 2018 checked by her PCP She did not have any symptoms at that time of weight loss, palpitations, weakness Did not have any history of neck swelling or difficulty swallowing  Her highest baseline free T4 was only about 1.4 Free T3 has not been higher than 3.2  However she was initially given methimazole  in 2020 and subsequently had been on PTU She did not feel any different with starting the PTU On her initial visit in 7/23 she was taking only 50 mg PTU once a day  With stopping her PTU her thyroid  functions were nearly the same with low normal TSH  No reported concerns  Not on any thyroid  medication  Wt Readings from Last 3 Encounters:  04/01/24 138 lb (62.6 kg)  12/31/23 139 lb (63 kg)  12/17/23 139 lb 11.2 oz (63.4 kg)    Thyroid  function tests as follows:     Baseline TSH 0.135 in 2018, was 0.2 in 2019  Lab Results  Component Value Date   FREET4 0.78 05/16/2023   FREET4 0.92 04/17/2022   FREET4 0.96 02/09/2022   T3FREE 2.8 07/19/2018   T3FREE 2.6 03/06/2018   T3FREE 2.5 01/07/2018   TSH 0.29 (L) 05/16/2023   TSH 0.36 04/17/2022   TSH 0.46 02/09/2022   Lab Results  Component Value Date   FREET4 0.78 05/16/2023   FREET4 0.92 04/17/2022   FREET4 0.96 02/09/2022   FREET4 0.72 09/27/2021   FREET4 0.82 05/23/2021   FREET4 0.82 01/25/2021   FREET4 0.98 10/25/2020   FREET4 1.30 05/03/2020   FREET4 0.98 07/01/2019    Lab Results  Component Value Date   THYROTRECAB <1.10 02/09/2022   THYROID  nodules: She had an incidental thyroid  nodule discovered on a CT scan and subsequently was sent for ultrasound and thyroid  biopsies  On: 01/23/2018 Heterogeneous  complex multinodular thyroid .   Recommend biopsy of the 2 most suspicious nodules, nodule 7 and 8  Needle aspiration showed Bethesda category 2 cytology and no further follow-up recommended  Did not do thyroid  U/S  DIABETES: See review of systems  Allergies as of 04/01/2024   No Known Allergies      Medication List        Accurate as of April 01, 2024  3:42 PM. If you have any questions, ask your nurse or doctor.          acetaminophen  650 MG CR tablet Commonly known as: Tylenol  8 Hour Take 1 tablet (650 mg total) by mouth every 8 (eight) hours as needed for pain.   AgaMatrix Presto w/Device Kit Twice daily sugar checks before meals   atorvastatin  80 MG tablet Commonly known as: LIPITOR Take 1 tablet by mouth once daily   Calcium  Citrate-Vitamin D  250-200 MG-UNIT Tabs Commonly known as: Citracal/Vitamin D  2 tabs by mouth twice daily   cetirizine  10 MG tablet Commonly known as: EQ Allergy Relief (Cetirizine ) Take 1 tablet (10 mg total) by mouth daily.   docusate sodium  100 MG capsule Commonly known as: Colace Take 1 capsule (100 mg total) by mouth 2 (two) times daily.   dorzolamide 2 % ophthalmic solution Commonly known  as: TRUSOPT 1 drop 2 (two) times daily.   glucose 4 GM chewable tablet Chew 1 tablet (4 g total) by mouth as needed for low blood sugar.   hydrOXYzine  25 MG capsule Commonly known as: VISTARIL  TAKE 1 CAPSULE BY MOUTH AT BEDTIME AS NEEDED FOR SLEEP   ibandronate  150 MG tablet Commonly known as: BONIVA  Take 1 tablet (150 mg total) by mouth every 30 (thirty) days. Take in the morning with a full glass of water, on an empty stomach, and do not take anything else by mouth or lie down for the next 30 min.   Lantus  SoloStar 100 UNIT/ML Solostar Pen Generic drug: insulin  glargine Inject 22 Units into the skin at bedtime. What changed: when to take this   latanoprost 0.005 % ophthalmic solution Commonly known as: XALATAN INSTILL 1 DROP INTO  EACH EYE ONCE DAILY IN THE EVENING   metFORMIN  1000 MG tablet Commonly known as: GLUCOPHAGE  TAKE 1 TABLET BY MOUTH TWICE DAILY WITH MEALS   mometasone  50 MCG/ACT nasal spray Commonly known as: Nasonex  2 sprays each nostril daily   Olopatadine  HCl 0.2 % Soln Commonly known as: Pataday  Apply 1 drop to eye 2 (two) times daily.   OneTouch Ultra test strip Generic drug: glucose blood USE TO CHECK CBGS. USE TO CHECK DAILY IN THE MORNING FASTING, 2 HOURS AFTER EATING, AND IF HAVING HYPER OR HYPOGLYCEMIC SYMPTOMS.   pantoprazole  20 MG tablet Commonly known as: PROTONIX  TAKE 1 TABLET BY MOUTH ONCE DAILY AS NEEDED FOR HEARTBURN   Trulicity  3 MG/0.5ML Soaj Generic drug: Dulaglutide  Inject 3 mg into the skin once a week.            Past Medical History:  Diagnosis Date   Allergy    Diabetes mellitus without complication (HCC)    GERD (gastroesophageal reflux disease)    Hyperlipidemia    Hypertension    Osteoporosis     Past Surgical History:  Procedure Laterality Date   APPENDECTOMY  1978   COLONOSCOPY     in [place]   TUBAL LIGATION  1985    Family History  Problem Relation Age of Onset   Asthma Brother    Thyroid  disease Neg Hx    Osteoporosis Neg Hx    Colon cancer Neg Hx    Colon polyps Neg Hx    Stomach cancer Neg Hx    Esophageal cancer Neg Hx     Social History:  reports that she has been smoking cigarettes. She has a 10.8 pack-year smoking history. She has never used smokeless tobacco. She reports that she does not drink alcohol and does not use drugs.  Allergies: No Known Allergies   Review of Systems  DIABETES type II: She is on basal insulin  using Lantus  22 units in a.m., metformin  1 g twice daily and Trulicity  3 mg weekly. Doesn't want to resume Trulicity /change to [facility] because feels no difference in BG /Weight with Trulicity .  She refused to consider the Dexcom that was recommended previously  Her blood sugars at home appear to be much lower than  expected for her A1c She was referred for nutritional counseling previously but she did not make an appointment One Touch ultra 2 Blood sugar readings  BG log: 55-133, DID NOT BRING METER, checks tidac  Weight history:  Wt Readings from Last 3 Encounters:  04/01/24 138 lb (62.6 kg)  12/31/23 139 lb (63 kg)  12/17/23 139 lb 11.2 oz (63.4 kg)    Lab Results  Component Value  Date   HGBA1C 7.8 (A) 04/01/2024   HGBA1C 8.2 (A) 12/31/2023   HGBA1C 8.4 (H) 12/17/2023   Lab Results  Component Value Date   MICROALBUR neg 11/19/2019   LDLCALC 30 03/26/2024   CREATININE 0.57 (L) 03/26/2024    OSTEOPOROSIS: She was started on Boniva  after her screening bone density in 05/2019 showed osteoporosis at the spine with T score -2.5 She takes this regularly without side effect Last vitamin D  level also normal  Bone density results: T-score AP Spine L1-L4 10/27/2021 69.4 -1.9 0.947 g/cm2 * AP Spine  L1-L4      05/13/2019    66.9         -2.5    0.882 g/cm2   DualFemur Neck Left  10/27/2021    69.4         -1.3    0.860 g/cm2 DualFemur Neck Left  05/13/2019    66.9         -1.5    0.828 g/cm2   DualFemur Total Mean 10/27/2021    69.4         -0.3    0.970 g/cm2 DualFemur Total Mean 05/13/2019    66.9         -0.4    0.961 g/cm2  On calcium , Vit D and Boniva  monthly since 3 years    Examination:   BP 120/70   Pulse 65   Ht 5' (1.524 m)   Wt 138 lb (62.6 kg)   SpO2 96%   BMI 26.95 kg/m   Firm slightly nodular right thyroid  lobe is palpable medially mostly on swallowing and about twice normal Left side not palpable  Assessment/Plan:  DIABETES type II No history of MI/stroke/neuropathy/retinopathy/nephropathy On Lantus  to 22 units in a.m., continue metformin  1 g twice daily, Trulicity  3 mg/week Start Lantus  to 20 units in a.m., continue metformin  1 g twice daily, Trulicity  3 mg/week (not interested in Trulicity  dose increase) 12/31/23 DENIES JARDIANCE to see if that can help her  come off of insulin   Not keen on CGM/seeing nutritionist for diabetes education Treat corn on right foot  Multinodular goiter She had an incidental thyroid  nodule discovered on a CT scan and subsequently was sent for ultrasound and thyroid  biopsies On: 01/23/2018 Heterogeneous complex multinodular thyroid . Recommend biopsy of the 2 most suspicious nodules, nodule 7 and 8 Needle aspiration showed Bethesda category 2 cytology 12/31/23: f/u thyroid  U/S not done: reordered, not done again. Will avoid any further orders due to non-compliance.   Subclinical hyperthyroidism She has a history of an autonomously functioning thyroid  noted to multinodular goiter. Doesn't need any thyroid  medication  Has been euthyroid without her low-dose PTU regimen and thyroid  levels can be checked periodically  Osteoporosis 02/2024: Ordered Repeat DXA showed osteopenia: Lumbar spine L1-L4 T-score  -1.7, Left femoral neck  -1.1 T score  Continue Calcium , Vit D every day Continue Boniva  150 mg monthly - 3 year use history per pt No current issues reported with her jaws  There are no Patient Instructions on file for this visit.  No follow-ups on file.  Alyssa Weaver 04/01/2024, 3:42 PM    Note: This office note was prepared with Dragon voice recognition system technology. Any transcriptional errors that result from this process are unintentional.

## 2024-04-15 ENCOUNTER — Telehealth: Payer: Self-pay

## 2024-04-15 DIAGNOSIS — E1165 Type 2 diabetes mellitus with hyperglycemia: Secondary | ICD-10-CM

## 2024-04-15 MED ORDER — ACCU-CHEK GUIDE TEST VI STRP
1.0000 | ORAL_STRIP | 2 refills | Status: AC | PRN
Start: 2024-04-15 — End: ?

## 2024-04-15 MED ORDER — ACCU-CHEK GUIDE ME W/DEVICE KIT: 1.0000 [IU] | PACK | Freq: Every day | 0 refills | Status: AC

## 2024-04-15 NOTE — Telephone Encounter (Signed)
 Received message from pharmacy for Accu chek guide due to insurance preference.  RX sent to pharmacy. Dm/cma

## 2024-05-24 ENCOUNTER — Other Ambulatory Visit: Payer: Self-pay | Admitting: Family Medicine

## 2024-05-24 DIAGNOSIS — E782 Mixed hyperlipidemia: Secondary | ICD-10-CM

## 2024-06-18 ENCOUNTER — Encounter: Payer: Self-pay | Admitting: Family Medicine

## 2024-06-18 ENCOUNTER — Ambulatory Visit: Admitting: Family Medicine

## 2024-06-18 VITALS — BP 118/64 | HR 78 | Temp 98.1°F | Ht 60.0 in | Wt 142.4 lb

## 2024-06-18 DIAGNOSIS — E782 Mixed hyperlipidemia: Secondary | ICD-10-CM

## 2024-06-18 DIAGNOSIS — Z7985 Long-term (current) use of injectable non-insulin antidiabetic drugs: Secondary | ICD-10-CM | POA: Diagnosis not present

## 2024-06-18 DIAGNOSIS — Z794 Long term (current) use of insulin: Secondary | ICD-10-CM

## 2024-06-18 DIAGNOSIS — Z7984 Long term (current) use of oral hypoglycemic drugs: Secondary | ICD-10-CM | POA: Diagnosis not present

## 2024-06-18 DIAGNOSIS — E1165 Type 2 diabetes mellitus with hyperglycemia: Secondary | ICD-10-CM

## 2024-06-18 DIAGNOSIS — F17200 Nicotine dependence, unspecified, uncomplicated: Secondary | ICD-10-CM | POA: Diagnosis not present

## 2024-06-18 NOTE — Progress Notes (Signed)
 [facility] [address] [place] [address] Dept: [phone] Dept Fax: [phone]  Chronic Care Office Visit  Subjective:    Patient ID: Alyssa Weaver, female    DOB: 1951-07-10, 73 y.o..   MRN: [id number]  Chief Complaint  Patient presents with   Diabetes    6 month f/u DM.  C/o having pain in RT hand when pulling things.   Average BS  88-123, 144   Medical Interpreter: Husband provided interpretation as medical interpreter was late arriving.  History of Present Illness:  Patient is in today for reassessment of chronic medical conditions.   Alyssa Weaver has a history of Type 2 diabetes. She is managed on insulin  glargine (Lantus ) 22 units each morning, metformin  1,000 mg bid, dulaglutide  (Trulicity ) 3 mg injections weekly. She is managed by Dr. Dartha (endocrinology). She notes her home glucoses are 88-123.    Alyssa Weaver has hyperlipidemia and is managed on atorvastatin  80 mg daily. She continues to smoke ~ 5 cigarettes a day. She admits that there is an aspect of habits that are hard for her to break.   Alyssa Weaver has a history of osteoporosis. She is managed on ibandronate  (Boniva ) 150 mg monthly (since 05/2019) and takes a daily calcium  + Vit. D supplement.  Past Medical History: Patient Active Problem List   Diagnosis Date Noted   Primary insomnia 01/09/2023   Cataract cortical, senile, bilateral 06/20/2022   Primary open angle glaucoma (POAG) of both eyes, moderate stage 03/14/2021   Gastroesophageal reflux disease 07/17/2019   Osteoporosis 05/20/2019   Multinodular thyroid  05/20/2019   Tobacco use disorder 01/07/2018   Seasonal allergic rhinitis due to pollen 01/07/2018   Environmental allergies    Type 2 diabetes mellitus with hyperglycemia, with long-term current use of insulin  (HCC) 01/04/2016   Hyperlipidemia 01/04/2016   Past Surgical History:  Procedure Laterality Date   APPENDECTOMY  1978    COLONOSCOPY     in [place]   TUBAL LIGATION  1985   Family History  Problem Relation Age of Onset   Asthma Brother    Thyroid  disease Neg Hx    Osteoporosis Neg Hx    Colon cancer Neg Hx    Colon polyps Neg Hx    Stomach cancer Neg Hx    Esophageal cancer Neg Hx    Outpatient Medications Prior to Visit  Medication Sig Dispense Refill   acetaminophen  (TYLENOL  8 HOUR) 650 MG CR tablet Take 1 tablet (650 mg total) by mouth every 8 (eight) hours as needed for pain. 20 tablet 0   atorvastatin  (LIPITOR) 80 MG tablet Take 1 tablet by mouth once daily 90 tablet 0   Blood Glucose Monitoring Suppl (ACCU-CHEK GUIDE ME) w/Device KIT 1 Units by Does not apply route daily. 1 kit 0   Blood Glucose Monitoring Suppl (AGAMATRIX PRESTO) w/Device KIT Twice daily sugar checks before meals 1 kit 0   Calcium  Citrate-Vitamin D  (CITRACAL/VITAMIN D ) 250-200 MG-UNIT TABS 2 tabs by mouth twice daily 120 each    cetirizine  (EQ ALLERGY RELIEF, CETIRIZINE ,) 10 MG tablet Take 1 tablet (10 mg total) by mouth daily. 90 tablet 3   docusate sodium  (COLACE) 100 MG capsule Take 1 capsule (100 mg total) by mouth 2 (two) times daily. 10 capsule 0   dorzolamide (TRUSOPT) 2 % ophthalmic solution 1 drop 2 (two) times daily.     glucose 4 GM chewable tablet Chew 1 tablet (4 g total) by mouth as needed for low  blood sugar. 50 tablet 12   glucose blood (ACCU-CHEK GUIDE TEST) test strip 1 each by Other route as needed for other. Use as instructed 100 each 2   glucose blood (ONETOUCH ULTRA) test strip USE TO CHECK CBGS. USE TO CHECK DAILY IN THE MORNING FASTING, 2 HOURS AFTER EATING, AND IF HAVING HYPER OR HYPOGLYCEMIC SYMPTOMS. 400 each 3   hydrOXYzine  (VISTARIL ) 25 MG capsule TAKE 1 CAPSULE BY MOUTH AT BEDTIME AS NEEDED FOR SLEEP 30 capsule 0   ibandronate  (BONIVA ) 150 MG tablet Take 1 tablet (150 mg total) by mouth every 30 (thirty) days. Take in the morning with a full glass of water, on an empty stomach, and do not take anything  else by mouth or lie down for the next 30 min. 3 tablet 3   insulin  glargine (LANTUS  SOLOSTAR) 100 UNIT/ML Solostar Pen Inject 22 Units into the skin at bedtime. (Patient taking differently: Inject 22 Units into the skin in the morning.) 15 mL 6   latanoprost (XALATAN) 0.005 % ophthalmic solution INSTILL 1 DROP INTO EACH EYE ONCE DAILY IN THE EVENING     metFORMIN  (GLUCOPHAGE ) 1000 MG tablet TAKE 1 TABLET BY MOUTH TWICE DAILY WITH MEALS 180 tablet 3   mometasone  (NASONEX ) 50 MCG/ACT nasal spray 2 sprays each nostril daily 17 g 5   Olopatadine  HCl (PATADAY ) 0.2 % SOLN Apply 1 drop to eye 2 (two) times daily. 2.5 mL 2   pantoprazole  (PROTONIX ) 20 MG tablet TAKE 1 TABLET BY MOUTH ONCE DAILY AS NEEDED FOR HEARTBURN 90 tablet 3   TRULICITY  3 MG/0.5ML SOAJ Inject 3 mg into the skin once a week. 6 mL 3   No facility-administered medications prior to visit.   No Known Allergies   Objective:   Today's Vitals   06/18/24 1448  BP: 118/64  Pulse: 78  Temp: 98.1 F (36.7 C)  TempSrc: Temporal  SpO2: 98%  Weight: 142 lb 6.4 oz (64.6 kg)  Height: 5' (1.524 m)   Body mass index is 27.81 kg/m.   General: Well developed, well nourished. No acute distress. Psych: Alert and oriented. Normal mood and affect.  Health Maintenance Due  Topic Date Due   Medicare Annual Wellness (AWV)  Never done   DTaP/Tdap/Td (1 - Tdap) Never done   Diabetic kidney evaluation - Urine ACR  08/17/2020   Lab Results Lab Results  Component Value Date   HGBA1C 7.8 (A) 04/01/2024   HGBA1C 8.2 (A) 12/31/2023   HGBA1C 8.4 (H) 12/17/2023     Assessment & Plan:   Problem List Items Addressed This Visit       Endocrine   Type 2 diabetes mellitus with hyperglycemia, with long-term current use of insulin  (HCC) - Primary (Chronic)   A1c is gradually improving. Continue to follow with Dr. Motwani. She will continue Lantus  22 units daily, metformin  1,000 mg q am and 500 mg q pm, and dulaglutide  (Trulicity ) 3 mg weekly. I  will check a micral today.      Relevant Orders   Microalbumin / creatinine urine ratio     Other   Hyperlipidemia   LDL cholesterol is at goal. Continue atorvastatin  80 mg daily.      Tobacco use disorder   I continue to encourage Ms. Heyliger to stop smoking. Discussed approaches to setting new healthy habits to help break old unhealthy ones.       Return in about 3 months (around 09/18/2024) for Reassessment.   Garnette CHRISTELLA Simpler, MD  LILLETTE Perkins  Lagle,acting as a scribe for Garnette CHRISTELLA Simpler, MD.,have documented all relevant documentation on the behalf of Garnette CHRISTELLA Simpler, MD.  I, Garnette CHRISTELLA Simpler, MD, have reviewed all documentation for this visit. The documentation on 06/18/2024 for the exam, diagnosis, procedures, and orders are all accurate and complete.

## 2024-06-18 NOTE — Assessment & Plan Note (Addendum)
 I continue to encourage Alyssa Weaver to stop smoking. Discussed approaches to setting new healthy habits to help break old unhealthy ones.

## 2024-06-18 NOTE — Assessment & Plan Note (Addendum)
 LDL cholesterol is at goal. Continue atorvastatin  80 mg daily.

## 2024-06-18 NOTE — Assessment & Plan Note (Addendum)
 A1c is gradually improving. Continue to follow with Dr. Motwani. She will continue Lantus  22 units daily, metformin  1,000 mg q am and 500 mg q pm, and dulaglutide  (Trulicity ) 3 mg weekly. I will check a micral today.

## 2024-06-19 LAB — MICROALBUMIN / CREATININE URINE RATIO
Creatinine,U: 54 mg/dL
Microalb Creat Ratio: 13.5 mg/g (ref 0.0–30.0)
Microalb, Ur: 0.7 mg/dL (ref 0.0–1.9)

## 2024-06-23 LAB — OPHTHALMOLOGY REPORT-SCANNED

## 2024-06-27 ENCOUNTER — Other Ambulatory Visit: Payer: Self-pay | Admitting: Family Medicine

## 2024-06-27 DIAGNOSIS — J301 Allergic rhinitis due to pollen: Secondary | ICD-10-CM

## 2024-07-14 NOTE — Progress Notes (Signed)
 Alyssa Weaver                                          MRN: [id number]   07/14/2024   The [facility] Quality Team Specialist reviewed this patient medical record for the purposes of chart review for care gap closure. The following were reviewed: abstraction for care gap closure-glycemic status assessment and kidney health evaluation for diabetes:eGFR  and uACR.    [facility] Quality Team

## 2024-07-18 ENCOUNTER — Other Ambulatory Visit: Payer: Self-pay | Admitting: Family Medicine

## 2024-07-18 DIAGNOSIS — E1165 Type 2 diabetes mellitus with hyperglycemia: Secondary | ICD-10-CM

## 2024-07-29 ENCOUNTER — Encounter: Payer: Self-pay | Admitting: Family Medicine

## 2024-07-29 ENCOUNTER — Ambulatory Visit (INDEPENDENT_AMBULATORY_CARE_PROVIDER_SITE_OTHER): Admitting: Family Medicine

## 2024-07-29 VITALS — BP 122/74 | HR 95 | Temp 98.5°F | Ht 63.5 in | Wt 143.4 lb

## 2024-07-29 DIAGNOSIS — Z758 Other problems related to medical facilities and other health care: Secondary | ICD-10-CM | POA: Diagnosis not present

## 2024-07-29 DIAGNOSIS — Z794 Long term (current) use of insulin: Secondary | ICD-10-CM | POA: Diagnosis not present

## 2024-07-29 DIAGNOSIS — H9202 Otalgia, left ear: Secondary | ICD-10-CM

## 2024-07-29 DIAGNOSIS — E1165 Type 2 diabetes mellitus with hyperglycemia: Secondary | ICD-10-CM | POA: Diagnosis not present

## 2024-07-29 MED ORDER — CIPROFLOXACIN-HYDROCORTISONE 0.2-1 % OT SUSP: 3.0000 [drp] | Freq: Two times a day (BID) | OTIC | 0 refills | Status: AC

## 2024-07-29 NOTE — Progress Notes (Unsigned)
 "  The patient is being seen today for an acute visit.  Patient Care Team: Thedora Garnette HERO, MD as PCP - General (Family Medicine) Adella Norris, MD (Internal Medicine) Dartha Ernst, MD as Consulting Physician (Endocrinology)   Subjective:   Chief Complaint  Patient presents with   Ear Pain    Right ear pain for 3 weeks pressure worse when laying on side      Review of Systems: Negative, with the exception of above mentioned in HPI.  History:   Reviewed by clinician on day of visit: allergies, medications, problem list, medical history, surgical history, family history, social history, and previous encounter notes.  Medications:   Active Medications[1] Allergies[2]  Objective:   BP 122/74 (BP Location: Left Arm, Patient Position: Sitting, Cuff Size: Large)   Pulse 95   Temp 98.5 F (36.9 C) (Oral)   Ht 5' 3.5 (1.613 m)   Wt 143 lb 6.4 oz (65 kg)   SpO2 98%   BMI 25.00 kg/m   Physical Exam {Insert previous labs (optional):23779} {See past labs  Heme  Chem  Endocrine  Serology  Results Review (optional):1}  Assessment & Plan:   Alyssa Weaver was seen today for ear pain.  Diagnoses and all orders for this visit:  Otalgia, left ear -     ciprofloxacin -hydrocortisone (CIPRO  HC) OTIC suspension; Place 3 drops into the left ear 2 (two) times daily.   Geni Shutter, DO, MS, FAAFP, Dipl. [facility] [address] [place], [address] Dept: [phone] Dept Fax: [phone]    [1]  Current Meds  Medication Sig   atorvastatin  (LIPITOR) 80 MG tablet Take 1 tablet by mouth once daily   Blood Glucose Monitoring Suppl (ACCU-CHEK GUIDE ME) w/Device KIT 1 Units by Does not apply route daily.   Blood Glucose Monitoring Suppl (AGAMATRIX PRESTO) w/Device KIT Twice daily sugar checks before meals   Calcium  Citrate-Vitamin D  (CITRACAL/VITAMIN D ) 250-200 MG-UNIT TABS 2 tabs by mouth twice daily   cetirizine   (ZYRTEC ) 10 MG tablet Take 1 tablet by mouth once daily   ciprofloxacin -hydrocortisone (CIPRO  HC) OTIC suspension Place 3 drops into the left ear 2 (two) times daily.   docusate sodium  (COLACE) 100 MG capsule Take 1 capsule (100 mg total) by mouth 2 (two) times daily. (Patient taking differently: Take 100 mg by mouth 2 (two) times daily as needed for mild constipation.)   dorzolamide (TRUSOPT) 2 % ophthalmic solution 1 drop 2 (two) times daily.   glucose 4 GM chewable tablet Chew 1 tablet (4 g total) by mouth as needed for low blood sugar.   glucose blood (ONETOUCH ULTRA) test strip USE TO CHECK CBGS. USE TO CHECK DAILY IN THE MORNING FASTING, 2 HOURS AFTER EATING, AND IF HAVING HYPER OR HYPOGLYCEMIC SYMPTOMS.   hydrOXYzine  (VISTARIL ) 25 MG capsule TAKE 1 CAPSULE BY MOUTH AT BEDTIME AS NEEDED FOR SLEEP   ibandronate  (BONIVA ) 150 MG tablet Take 1 tablet (150 mg total) by mouth every 30 (thirty) days. Take in the morning with a full glass of water, on an empty stomach, and do not take anything else by mouth or lie down for the next 30 min.   ibuprofen (ADVIL) 400 MG tablet Take 400 mg by mouth every 6 (six) hours as needed for fever, mild pain (pain score 1-3) or moderate pain (pain score 4-6).   insulin  glargine (LANTUS  SOLOSTAR) 100 UNIT/ML Solostar Pen Inject 22 Units into the skin at bedtime.   latanoprost (XALATAN) 0.005 %  ophthalmic solution INSTILL 1 DROP INTO EACH EYE ONCE DAILY IN THE EVENING   metFORMIN  (GLUCOPHAGE ) 1000 MG tablet TAKE 1 TABLET BY MOUTH TWICE DAILY WITH MEALS   mometasone  (NASONEX ) 50 MCG/ACT nasal spray 2 sprays each nostril daily (Patient taking differently: Place 2 sprays into the nose as needed. 2 sprays each nostril daily)   Olopatadine  HCl (PATADAY ) 0.2 % SOLN Apply 1 drop to eye 2 (two) times daily. (Patient taking differently: Apply 1 drop to eye 2 (two) times daily as needed.)   pantoprazole  (PROTONIX ) 20 MG tablet TAKE 1 TABLET BY MOUTH ONCE DAILY AS NEEDED FOR  HEARTBURN   TRULICITY  3 MG/0.5ML SOAJ INJECT 3 MG INTO THE SKIN ONCE A WEEK  [2] No Known Allergies  "

## 2024-08-21 ENCOUNTER — Other Ambulatory Visit: Payer: Self-pay | Admitting: Family

## 2024-08-21 DIAGNOSIS — E782 Mixed hyperlipidemia: Secondary | ICD-10-CM

## 2024-08-25 ENCOUNTER — Other Ambulatory Visit: Payer: Self-pay | Admitting: Family Medicine

## 2024-08-25 DIAGNOSIS — E1165 Type 2 diabetes mellitus with hyperglycemia: Secondary | ICD-10-CM

## 2024-08-26 ENCOUNTER — Other Ambulatory Visit: Payer: Self-pay | Admitting: Family Medicine

## 2024-08-26 DIAGNOSIS — K219 Gastro-esophageal reflux disease without esophagitis: Secondary | ICD-10-CM

## 2024-08-26 NOTE — Telephone Encounter (Signed)
 Requesting:  Pantoprazole  Sodium 20 MG Oral Tablet Delayed Release  Last Visit: 06/18/2024 Next Visit: 09/19/2024 Last Refill: 08/26/2024 by Dr. Thedora Duplicate Request  Please Advise

## 2024-09-19 ENCOUNTER — Ambulatory Visit: Admitting: Family Medicine

## 2024-09-29 ENCOUNTER — Ambulatory Visit: Admitting: "Endocrinology
